# Patient Record
Sex: Male | Born: 1945 | Race: White | Hispanic: No | Marital: Married | Smoking: Former smoker
Health system: Southern US, Community
[De-identification: ages and names within clinical notes are randomized; demographics above are authoritative.]

## PROBLEM LIST (undated history)

## (undated) DIAGNOSIS — C61 Malignant neoplasm of prostate: Secondary | ICD-10-CM

## (undated) DIAGNOSIS — J189 Pneumonia, unspecified organism: Secondary | ICD-10-CM

## (undated) DIAGNOSIS — I10 Essential (primary) hypertension: Secondary | ICD-10-CM

## (undated) DIAGNOSIS — N289 Disorder of kidney and ureter, unspecified: Secondary | ICD-10-CM

## (undated) DIAGNOSIS — Z7901 Long term (current) use of anticoagulants: Secondary | ICD-10-CM

## (undated) DIAGNOSIS — D751 Secondary polycythemia: Secondary | ICD-10-CM

## (undated) DIAGNOSIS — K5790 Diverticulosis of intestine, part unspecified, without perforation or abscess without bleeding: Secondary | ICD-10-CM

## (undated) DIAGNOSIS — I82409 Acute embolism and thrombosis of unspecified deep veins of unspecified lower extremity: Secondary | ICD-10-CM

## (undated) DIAGNOSIS — Z86010 Personal history of colonic polyps: Secondary | ICD-10-CM

## (undated) DIAGNOSIS — D682 Hereditary deficiency of other clotting factors: Secondary | ICD-10-CM

## (undated) DIAGNOSIS — E7800 Pure hypercholesterolemia, unspecified: Secondary | ICD-10-CM

## (undated) DIAGNOSIS — N200 Calculus of kidney: Secondary | ICD-10-CM

## (undated) HISTORY — DX: Malignant neoplasm of prostate: C61

## (undated) HISTORY — DX: Diverticulosis of intestine, part unspecified, without perforation or abscess without bleeding: K57.90

## (undated) HISTORY — DX: Personal history of colonic polyps: Z86.010

## (undated) HISTORY — PX: COLONOSCOPY: SHX174

## (undated) HISTORY — DX: Long term (current) use of anticoagulants: Z79.01

## (undated) HISTORY — DX: Pneumonia, unspecified organism: J18.9

## (undated) HISTORY — DX: Acute embolism and thrombosis of unspecified deep veins of unspecified lower extremity: I82.409

## (undated) HISTORY — DX: Disorder of kidney and ureter, unspecified: N28.9

## (undated) HISTORY — DX: Essential (primary) hypertension: I10

## (undated) HISTORY — PX: TONSILLECTOMY: SUR1361

## (undated) HISTORY — DX: Pure hypercholesterolemia, unspecified: E78.00

## (undated) HISTORY — DX: Hereditary deficiency of other clotting factors: D68.2

## (undated) HISTORY — DX: Calculus of kidney: N20.0

## (undated) HISTORY — DX: Secondary polycythemia: D75.1

---

## 2000-01-22 ENCOUNTER — Encounter: Payer: Self-pay | Admitting: Internal Medicine

## 2000-01-22 ENCOUNTER — Inpatient Hospital Stay (HOSPITAL_COMMUNITY): Admission: EM | Admit: 2000-01-22 | Discharge: 2000-01-23 | Payer: Self-pay | Admitting: Emergency Medicine

## 2000-01-23 ENCOUNTER — Encounter: Payer: Self-pay | Admitting: Internal Medicine

## 2000-04-22 ENCOUNTER — Encounter: Payer: Self-pay | Admitting: Internal Medicine

## 2000-04-22 ENCOUNTER — Ambulatory Visit (HOSPITAL_COMMUNITY): Admission: RE | Admit: 2000-04-22 | Discharge: 2000-04-22 | Payer: Self-pay | Admitting: Internal Medicine

## 2005-05-11 ENCOUNTER — Ambulatory Visit: Payer: Self-pay | Admitting: Internal Medicine

## 2006-02-08 ENCOUNTER — Ambulatory Visit: Payer: Self-pay | Admitting: Internal Medicine

## 2006-02-15 ENCOUNTER — Ambulatory Visit: Payer: Self-pay | Admitting: *Deleted

## 2007-08-23 ENCOUNTER — Ambulatory Visit: Payer: Self-pay | Admitting: Internal Medicine

## 2007-08-23 DIAGNOSIS — I10 Essential (primary) hypertension: Secondary | ICD-10-CM | POA: Insufficient documentation

## 2007-08-24 ENCOUNTER — Ambulatory Visit: Payer: Self-pay | Admitting: Internal Medicine

## 2007-08-25 ENCOUNTER — Telehealth: Payer: Self-pay | Admitting: Internal Medicine

## 2007-08-25 ENCOUNTER — Encounter: Payer: Self-pay | Admitting: Internal Medicine

## 2008-05-24 ENCOUNTER — Ambulatory Visit: Payer: Self-pay | Admitting: Internal Medicine

## 2008-05-24 ENCOUNTER — Emergency Department (HOSPITAL_COMMUNITY): Admission: EM | Admit: 2008-05-24 | Discharge: 2008-05-24 | Payer: Self-pay | Admitting: Emergency Medicine

## 2008-06-25 ENCOUNTER — Ambulatory Visit: Payer: Self-pay | Admitting: Gastroenterology

## 2008-07-05 ENCOUNTER — Ambulatory Visit: Payer: Self-pay | Admitting: Gastroenterology

## 2008-10-11 ENCOUNTER — Ambulatory Visit: Payer: Self-pay | Admitting: Family Medicine

## 2009-01-02 ENCOUNTER — Ambulatory Visit: Payer: Self-pay | Admitting: Internal Medicine

## 2009-01-03 ENCOUNTER — Encounter: Payer: Self-pay | Admitting: Internal Medicine

## 2009-01-06 ENCOUNTER — Encounter: Payer: Self-pay | Admitting: Internal Medicine

## 2009-06-04 ENCOUNTER — Encounter: Payer: Self-pay | Admitting: Internal Medicine

## 2009-08-15 ENCOUNTER — Ambulatory Visit: Payer: Self-pay | Admitting: Internal Medicine

## 2009-08-15 ENCOUNTER — Ambulatory Visit: Payer: Self-pay

## 2009-08-15 DIAGNOSIS — I82409 Acute embolism and thrombosis of unspecified deep veins of unspecified lower extremity: Secondary | ICD-10-CM | POA: Insufficient documentation

## 2009-08-18 ENCOUNTER — Ambulatory Visit: Payer: Self-pay | Admitting: Internal Medicine

## 2009-08-18 LAB — CONVERTED CEMR LAB
Folate: 11.1 ng/mL
Vitamin B-12: 365 pg/mL (ref 211–911)

## 2009-08-20 DIAGNOSIS — D682 Hereditary deficiency of other clotting factors: Secondary | ICD-10-CM | POA: Insufficient documentation

## 2010-02-16 ENCOUNTER — Ambulatory Visit: Payer: Self-pay | Admitting: Cardiology

## 2010-06-03 ENCOUNTER — Ambulatory Visit: Payer: Self-pay | Admitting: Internal Medicine

## 2010-06-03 DIAGNOSIS — B351 Tinea unguium: Secondary | ICD-10-CM | POA: Insufficient documentation

## 2010-06-08 LAB — CONVERTED CEMR LAB
AST: 33 units/L (ref 0–37)
Albumin: 4.5 g/dL (ref 3.5–5.2)
Alkaline Phosphatase: 65 units/L (ref 39–117)
Basophils Absolute: 0 10*3/uL (ref 0.0–0.1)
Basophils Relative: 0.5 % (ref 0.0–3.0)
Bilirubin, Direct: 0.1 mg/dL (ref 0.0–0.3)
Calcium: 10.1 mg/dL (ref 8.4–10.5)
Cholesterol: 152 mg/dL (ref 0–200)
Creatinine, Ser: 1.3 mg/dL (ref 0.4–1.5)
Direct LDL: 74.5 mg/dL
Eosinophils Absolute: 0.2 10*3/uL (ref 0.0–0.7)
GFR calc non Af Amer: 61.07 mL/min (ref >60.00)
HDL: 28.3 mg/dL — ABNORMAL LOW (ref >39.00)
Hemoglobin: 17.3 g/dL — ABNORMAL HIGH (ref 13.0–17.0)
Lymphocytes Relative: 21.8 % (ref 12.0–46.0)
Monocytes Relative: 9.6 % (ref 3.0–12.0)
Neutro Abs: 4.3 10*3/uL (ref 1.4–7.7)
Neutrophils Relative %: 65.1 % (ref 43.0–77.0)
RBC: 4.63 M/uL (ref 4.22–5.81)
RDW: 12.9 % (ref 11.5–14.6)
Sodium: 141 meq/L (ref 135–145)
Total CHOL/HDL Ratio: 5
Triglycerides: 312 mg/dL — ABNORMAL HIGH (ref 0.0–149.0)

## 2010-06-23 ENCOUNTER — Ambulatory Visit: Payer: Self-pay | Admitting: Cardiology

## 2010-07-19 LAB — CONVERTED CEMR LAB
AntiThromb III Func: 93 % (ref 76–126)
Anticardiolipin IgG: 6 (ref <10)
Basophils Absolute: 0 10*3/uL (ref 0.0–0.1)
Basophils Relative: 0.4 % (ref 0.0–3.0)
Eosinophils Absolute: 0.2 10*3/uL (ref 0.0–0.7)
Eosinophils Relative: 3.5 % (ref 0.0–5.0)
HCT: 44.5 % (ref 39.0–52.0)
Hemoglobin: 15.4 g/dL (ref 13.0–17.0)
Homocysteine: 15.6 micromoles/L — ABNORMAL HIGH (ref 4.0–15.4)
INR: 1.1 — ABNORMAL HIGH (ref 0.8–1.0)
Lymphocytes Relative: 21.6 % (ref 12.0–46.0)
Lymphs Abs: 1.5 10*3/uL (ref 0.7–4.0)
MCHC: 34.5 g/dL (ref 30.0–36.0)
MCV: 103.3 fL — ABNORMAL HIGH (ref 78.0–100.0)
Monocytes Absolute: 0.6 10*3/uL (ref 0.1–1.0)
Monocytes Relative: 9.1 % (ref 3.0–12.0)
Neutro Abs: 4.7 10*3/uL (ref 1.4–7.7)
Neutrophils Relative %: 65.4 % (ref 43.0–77.0)
Platelets: 157 10*3/uL (ref 150.0–400.0)
Prothrombin Time: 11.6 s (ref 9.1–11.7)
RBC: 4.31 M/uL (ref 4.22–5.81)
RDW: 12.1 % (ref 11.5–14.6)
WBC: 7 10*3/uL (ref 4.5–10.5)

## 2010-07-23 NOTE — Assessment & Plan Note (Signed)
Summary: CPX (PT WILL COME IN FASTING) // RS   Vital Signs:  Patient profile:   65 year old male Height:      73.75 inches Weight:      213 pounds BMI:     27.63 Temp:     98.5 degrees F oral Pulse rate:   70 / minute Pulse rhythm:   regular BP sitting:   112 / 82  (left arm) Cuff size:   large  Vitals Entered By: Alfred Levins, CMA (June 03, 2010 8:08 AM) CC: cpx, fasting   CC:  cpx and fasting.  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 Take 1 Tablet By Mouth Every Day 2)  Metoprolol Tartrate 25 Mg Tabs (Metoprolol Tartrate) .... Take 1 Tablet By Mouth Two Times A Day 3)  Niaspan 1000 Mg  Tbcr (Niacin (Antihyperlipidemic)) .... Take 1 Tablet By Mouth Once A Day 4)  Dramamine 50 Mg  Tabs (Dimenhydrinate) .Marland Kitchen.. 1 1/2 Hs 5)  Multivitamins  Caps (Multiple Vitamin) .... Once Daily 6)  Aspirin 325 Mg Tabs (Aspirin) .... Take 1 Tab By Mouth Every Day  Allergies (verified): No Known Drug Allergies  Past History:  Past Medical History: Hypertension DVT---followed by dr Patty Sermons (off warfarin after 6-7 months) Factor V deficiency  Physical Exam  General:  well-developed well-nourished male in no acute distress. HEENT exam atraumatic, normocephalic symmetric her muscles are intact. He is wearing glasses. Neck is supple without lymphadenopathy or jugular venous distention. Chest clear to auscultation cardiac exam S1-S2 are regular. Abdominal exam across and soft and nontender. Extremities no clubbing cyanosis or edema. Neurologic exam is alert and oriented without any motor or sensory deficits. Rectal exam normal tone moderately enlarged prostate without masses or asymmetry.   Impression & Recommendations:  Problem # 1:  PHYSICAL  EXAMINATION (ICD-V70.0) health maintenance up-to-date except he needs tetanus immunization and shingles vaccine. He's had flu immunization. Reviewed colonoscopy report. He exercises regularly. If his lab work is unremarkable I'll see him back in one year. Orders: Venipuncture (04540) TLB-Lipid Panel (80061-LIPID) TLB-BMP (Basic Metabolic Panel-BMET) (80048-METABOL) TLB-CBC Platelet - w/Differential (85025-CBCD) TLB-Hepatic/Liver Function Pnl (80076-HEPATIC) TLB-TSH (Thyroid Stimulating Hormone) (84443-TSH) TLB-PSA (Prostate Specific Antigen) (84153-PSA) UA Dipstick w/o Micro (automated)  (81003)  Complete Medication List: 1)  Spironolactone 25 Mg Tabs (Spironolactone) .... Take 1 tablet by mouth every day 2)  Metoprolol Tartrate 25 Mg Tabs (Metoprolol tartrate) .... Take 1 tablet by mouth two times a day 3)  Niaspan 1000 Mg Tbcr (Niacin (antihyperlipidemic)) .... Take 1 tablet by mouth once a day 4)  Dramamine 50 Mg Tabs (Dimenhydrinate) .Marland Kitchen.. 1 1/2 hs 5)  Multivitamins Caps (Multiple vitamin) .... Once daily 6)  Aspirin 325 Mg Tabs (Aspirin) .... Take 1 tab by mouth every day    Orders Added: 1)  Venipuncture [36415] 2)  TLB-Lipid Panel [80061-LIPID] 3)  TLB-BMP (Basic Metabolic Panel-BMET) [80048-METABOL] 4)  TLB-CBC Platelet - w/Differential [85025-CBCD] 5)  TLB-Hepatic/Liver Function Pnl [80076-HEPATIC] 6)  TLB-TSH (Thyroid Stimulating Hormone) [84443-TSH] 7)  TLB-PSA (Prostate Specific Antigen) [98119-JYN] 8)  UA Dipstick w/o Micro (automated)  [81003]   Immunization History:  Influenza Immunization History:    Influenza:  fluvax 3+ (05/21/2010)   Immunization History:  Influenza Immunization History:    Influenza:  Fluvax 3+ (05/21/2010)  Appended Document: Orders Update Medications Added TERBINAFINE HCL 250 MG TABS (TERBINAFINE HCL) Take 1 tablet by mouth once a day          Clinical Lists Changes  Medications: Added new medication of TERBINAFINE HCL 250  MG TABS (TERBINAFINE HCL) Take 1 tablet by mouth once a day - Signed Rx of TERBINAFINE HCL 250 MG TABS (TERBINAFINE HCL) Take 1 tablet by mouth once a day;  #90 x 0;  Signed;  Entered by: Birdie Sons MD;  Authorized by: Birdie Sons MD;  Method used: Electronically to [facility] [address] [phone]*, [address], [address] [place], [place]  [address], Ph: [phone], Fax: [phone] Orders: Added new Service order of Est. Patient 40-64 years (57846) - Signed Added new Service order of Est. Patient Level II ([address]) - Signed    Prescriptions: TERBINAFINE HCL 250 MG TABS (TERBINAFINE HCL) Take 1 tablet by mouth once a day  #90 x 0   Entered and Authorized by:   Birdie Sons MD   Signed by:   Birdie Sons MD on 06/03/2010   Method used:   Electronically to        [facility] [address] [phone]* (retail)       [address]       [address]       [place], [place]  [address]       Ph: [phone]       Fax: [phone]   RxID:   [id number]    At the end of examination patient reports a new complaint. He does have discolored, thickened toenails on both feet. He would like treatment. Discussed. Will treat with Lamisil 250 mg p.o. q. day for 90 days.  Appended Document: Orders Update     Clinical Lists Changes  Orders: Added new Service order of Specimen Handling (66063) - Signed      Appended Document: CPX (PT WILL COME IN FASTING) // RS  Laboratory Results   Urine Tests    Routine Urinalysis   Color: yellow Appearance: Clear Glucose: negative   (Normal Range: Negative) Bilirubin: 1+   (Normal Range: Negative) Ketone: 1+   (Normal Range: Negative) Spec. Gravity: 1.025   (Normal Range: 1.003-1.035) Blood: 1+   (Normal Range: Negative) pH: 5.0   (Normal Range: 5.0-8.0) Protein: 1+   (Normal Range: Negative) Urobilinogen: 0.2   (Normal Range: 0-1) Nitrite: negative   (Normal Range: Negative) Leukocyte Esterace: negative   (Normal Range: Negative)      Comments: Rita Ohara  June 03, 2010 11:55 AM       Immunizations Administered:  Tetanus Vaccine:    Vaccine Type: Tdap    Site: left deltoid    Mfr: GlaxoSmithKline    Dose: 0.5 ml    Route: IM    Given by: Alfred Levins, CMA    Exp. Date: 04/09/2012    Lot #: [id number]  Zostavax # 1:    Vaccine Type: Zostavax    Site: right arm    Mfr: Merck    Dose: 0.5 ml    Route:     Given by: Alfred Levins, CMA    Exp. Date: 04/22/2011    Lot #: [id number]

## 2010-07-23 NOTE — Assessment & Plan Note (Signed)
Summary: PAINFUL KNOT ON L LEG // RS   Vital Signs:  Patient profile:   65 year old male Height:      74 inches Weight:      211 pounds BMI:     27.19 Temp:     98.1 degrees F oral BP sitting:   130 / 90  (left arm) Cuff size:   regular  Vitals Entered By: Kern Reap CMA Duncan Dull) (August 15, 2009 9:28 AM) CC: pain with left calf Is Patient Diabetic? No Pain Assessment Patient in pain? yes     Location: calf Intensity: 1 Type: dull   CC:  pain with left calf.  History of Present Illness: long trip to [place] and back , returned wed on the way down he noted painful left calf and a knot on the back eval at local [facility]---unrevealing now has some swelling of left leg hx of superficial phlebiits  All other systems reviewed and were negative   Current Problems (verified): 1)  Special Screening For Malignant Neoplasms Colon  (ICD-V76.51) 2)  Insomnia  (ICD-780.52) 3)  Hypertension  (ICD-401.9)  Current Medications (verified): 1)  Spironolactone 25 Mg  Tabs (Spironolactone) .... Take 1 Tablet By Mouth Every Day 2)  Metoprolol Tartrate 25 Mg Tabs (Metoprolol Tartrate) .... Take 1 Tablet By Mouth Two Times A Day 3)  Niaspan 1000 Mg  Tbcr (Niacin (Antihyperlipidemic)) .... Take 1 Tablet By Mouth Once A Day 4)  Aspirin Ec 325 Mg  Tbec (Aspirin) .... Once Daily 5)  Dramamine 50 Mg  Tabs (Dimenhydrinate) .Marland Kitchen.. 1 1/2 Hs 6)  Multivitamins  Caps (Multiple Vitamin) .... Once Daily  Allergies (verified): No Known Drug Allergies  Past History:  Past Medical History: Last updated: 08/23/2007 Hypertension insomnia  Past Surgical History: Last updated: 08/23/2007 Tonsillectomy, adenoidectomy  Family History: Last updated: 08/23/2007 Family History Other cancer--father pancreatic, deceased Family History Hypertension--father mother with dementia  Social History: Last updated: 08/23/2007 Occupation:accountant Married 3 healthy children  Risk Factors: Smoking Status: quit  (08/23/2007)  Review of Systems       All other systems reviewed and were negative   Physical Exam  General:  Well-developed,well-nourished,in no acute distress; alert,appropriate and cooperative throughout examination Pulses:  R radial normal and L radial normal.   Extremities:  1+ left pedal edema.  0 edema on right Skin:  hyperpigmentation of both legs below mid-calf   Impression & Recommendations:  Problem # 1:  LEG PAIN, LEFT (ICD-729.5) some concern for DVT needs eval ultrasound left leg today  Complete Medication List: 1)  Spironolactone 25 Mg Tabs (Spironolactone) .... Take 1 tablet by mouth every day 2)  Metoprolol Tartrate 25 Mg Tabs (Metoprolol tartrate) .... Take 1 tablet by mouth two times a day 3)  Niaspan 1000 Mg Tbcr (Niacin (antihyperlipidemic)) .... Take 1 tablet by mouth once a day 4)  Aspirin Ec 325 Mg Tbec (Aspirin) .... Once daily 5)  Dramamine 50 Mg Tabs (Dimenhydrinate) .Marland Kitchen.. 1 1/2 hs 6)  Multivitamins Caps (Multiple vitamin) .... Once daily  Appended Document: Orders Update     Clinical Lists Changes  Orders: Added new Referral order of Misc. Referral (Misc. Ref) - Signed      Appended Document: PAINFUL KNOT ON L LEG // RS     Vital Signs:  Patient profile:   65 year old male Weight:      211 pounds (95.91 kg)  History of Present Illness: patient returns with his wife.  He has a new diagnosis of  DVT.  Upon further questioning he denies any shortness of breath.  Leg discomfort is rated as one.  Have discussed DVT results with ultrasound technician.  Allergies: No Known Drug Allergies  Physical Exam  Neck:  no adenopathy Chest Wall:  No deformities, masses, tenderness or gynecomastia noted. Lungs:  Normal respiratory effort, chest expands symmetrically. Lungs are clear to auscultation, no crackles or wheezes. Heart:  normal rate, regular rhythm, and no rub.   Abdomen:  Bowel sounds positive,abdomen soft and non-tender without  masses, organomegaly or hernias noted.   Impression & Recommendations:  Problem # 1:  DVT (ICD-453.40) long discussion with pt and wife check labs instructed on use of lovenox---called pharmacy to confirm they have lovenox see me Monday Orders: Venipuncture ([address]) TLB-CBC Platelet - w/Differential (85025-CBCD) TLB-PT (Protime) (85610-PTP) T-Hypercoagulation Profile [phone]) Lovenox 10mg  Inj. (G9562) Admin of Therapeutic Inj  intramuscular or subcutaneous (13086)  Complete Medication List: 1)  Spironolactone 25 Mg Tabs (Spironolactone) .... Take 1 tablet by mouth every day 2)  Metoprolol Tartrate 25 Mg Tabs (Metoprolol tartrate) .... Take 1 tablet by mouth two times a day 3)  Niaspan 1000 Mg Tbcr (Niacin (antihyperlipidemic)) .... Take 1 tablet by mouth once a day 4)  Aspirin Ec 325 Mg Tbec (Aspirin) .... Once daily 5)  Dramamine 50 Mg Tabs (Dimenhydrinate) .Marland Kitchen.. 1 1/2 hs 6)  Multivitamins Caps (Multiple vitamin) .... Once daily 7)  Warfarin Sodium 5 Mg Tabs (Warfarin sodium) .... 2 by mouth once daily or as directed 8)  Lovenox 150 Mg/ml Soln (Enoxaparin sodium) .... Inject contents of one syringe subcutaneously once daily for 10 days  Patient Instructions: 1)  See me Monday Prescriptions: LOVENOX 150 MG/ML SOLN (ENOXAPARIN SODIUM) inject contents of one syringe Subcutaneously once daily for 10 days  #10 x 0   Entered and Authorized by:   Birdie Sons MD   Signed by:   Birdie Sons MD on 08/15/2009   Method used:   Electronically to        [facility] [address] [phone]* (retail)       [address]       [address]       [place], [place]  [address]       Ph: [phone]       Fax: [phone]   RxID:   [id number] WARFARIN SODIUM 5 MG TABS (WARFARIN SODIUM) 2 by mouth once daily or as directed  #60 x 3   Entered and Authorized by:   Birdie Sons MD   Signed by:   Birdie Sons MD on 08/15/2009   Method used:   Electronically to        [facility]  Dr. [phone]* (retail)       [address]       [address]       [place], [place]  [address]       Ph: [phone]       Fax: [phone]   RxID:   [id number]    Medication Administration  Injection # 1:    Medication: Lovenox 10mg  Inj.    Diagnosis: DVT (ICD-453.40)    Route: SQ    Site: r abd    Exp Date: 08/19/2009    Lot #: [id number]    Mfr: Sanofi Pasteur    Comments: 150 mg/ml given    Patient tolerated injection without complications    Given by: Kern Reap CMA Duncan Dull) (August 15, 2009 12:46 PM)  Orders Added: 1)  Venipuncture [  36415] 2)  TLB-CBC Platelet - w/Differential [85025-CBCD] 3)  TLB-PT (Protime) [85610-PTP] 4)  T-Hypercoagulation Profile [83090-83892-85730] 5)  Lovenox 10mg  Inj. [J1650] 6)  Admin of Therapeutic Inj  intramuscular or subcutaneous [96[address]] 7)  Est. Patient Level V [04540]

## 2010-07-23 NOTE — Assessment & Plan Note (Signed)
Summary: 3 day rov/njr   Vital Signs:  Patient profile:   65 year old male Temp:     98.3 degrees F oral Pulse rate:   60 / minute Resp:     12 per minute BP sitting:   146 / 96  (left arm)  Vitals Entered By: Gladis Riffle, RN (August 18, 2009 9:09 AM) CC: 3 day rov--states leg without pain or" knot" Is Patient Diabetic? No   CC:  3 day rov--states leg without pain or" knot".  History of Present Illness: DVT followup  tolerating lovenox tolerating warfarin 10 mg by mouth once daily  NO SOB pain of left leg has resolved no edema  All other systems reviewed and were negative   Preventive Screening-Counseling & Management  Alcohol-Tobacco     Smoking Status: quit  Medications Prior to Update: 1)  Spironolactone 25 Mg  Tabs (Spironolactone) .... Take 1 Tablet By Mouth Every Day 2)  Metoprolol Tartrate 25 Mg Tabs (Metoprolol Tartrate) .... Take 1 Tablet By Mouth Two Times A Day 3)  Niaspan 1000 Mg  Tbcr (Niacin (Antihyperlipidemic)) .... Take 1 Tablet By Mouth Once A Day 4)  Aspirin Ec 325 Mg  Tbec (Aspirin) .... Once Daily 5)  Dramamine 50 Mg  Tabs (Dimenhydrinate) .Marland Kitchen.. 1 1/2 Hs 6)  Multivitamins  Caps (Multiple Vitamin) .... Once Daily 7)  Warfarin Sodium 5 Mg Tabs (Warfarin Sodium) .... 2 By Mouth Once Daily or As Directed 8)  Lovenox 150 Mg/ml Soln (Enoxaparin Sodium) .... Inject Contents of One Syringe Subcutaneously Once Daily For 10 Days  Allergies (verified): No Known Drug Allergies  Physical Exam  General:  Well-developed,well-nourished,in no acute distress; alert,appropriate and cooperative throughout examination Head:  normocephalic and atraumatic.   Msk:  No deformity or scoliosis noted of thoracic or lumbar spine.   Extremities:  No clubbing, cyanosis, edema, or deformity noted  Neurologic:  cranial nerves II-XII intact and gait normal.   Skin:  turgor normal and color normal.     Impression & Recommendations:  Problem # 1:  DVT  (ICD-453.40) clinically improving see new orders Orders: Fingerstick ([address]) Protime (60454UJ) TLB-B12 + Folate Pnl (81191_47829-F62/ZHY) Venipuncture (86578)  Problem # 2:  HYPERTENSION (ICD-401.9) BPs at home 130/75---continue to monitor His updated medication list for this problem includes:    Spironolactone 25 Mg Tabs (Spironolactone) .Marland Kitchen... Take 1 tablet by mouth every day    Metoprolol Tartrate 25 Mg Tabs (Metoprolol tartrate) .Marland Kitchen... Take 1 tablet by mouth two times a day  BP today: 146/96 Prior BP: 130/90 (08/15/2009)  Problem # 3:  COAGULOPATHY, COUMADIN-INDUCED (ICD-286.5) INR 8.0 stop lovenox stop asa stop warfarin for one day resume warfarin at 2.5 mg by mouth once daily  protime friday  Complete Medication List: 1)  Spironolactone 25 Mg Tabs (Spironolactone) .... Take 1 tablet by mouth every day 2)  Metoprolol Tartrate 25 Mg Tabs (Metoprolol tartrate) .... Take 1 tablet by mouth two times a day 3)  Niaspan 1000 Mg Tbcr (Niacin (antihyperlipidemic)) .... Take 1 tablet by mouth once a day 4)  Dramamine 50 Mg Tabs (Dimenhydrinate) .Marland Kitchen.. 1 1/2 hs 5)  Multivitamins Caps (Multiple vitamin) .... Once daily 6)  Warfarin Sodium 5 Mg Tabs (Warfarin sodium) .... 1/2 by mouth once daily or as directed  Appended Document: 3 day rov/njr     Allergies: No Known Drug Allergies   Complete Medication List: 1)  Spironolactone 25 Mg Tabs (Spironolactone) .... Take 1 tablet by mouth every day 2)  Metoprolol  Tartrate 25 Mg Tabs (Metoprolol tartrate) .... Take 1 tablet by mouth two times a day 3)  Niaspan 1000 Mg Tbcr (Niacin (antihyperlipidemic)) .... Take 1 tablet by mouth once a day 4)  Dramamine 50 Mg Tabs (Dimenhydrinate) .Marland Kitchen.. 1 1/2 hs 5)  Multivitamins Caps (Multiple vitamin) .... Once daily 6)  Warfarin Sodium 5 Mg Tabs (Warfarin sodium) .... 1/2 by mouth once daily or as directed    ANTICOAGULATION RECORD PREVIOUS REGIMEN & LAB RESULTS   Previous INR:  1.1 ratio on   08/15/2009    NEW REGIMEN & LAB RESULTS Anticoag. Dx: Deep venous thrombosis Current INR Goal Range: 2.0-3.0 Current INR: 8.0 Current Coumadin Dose(mg): 5mg  qd Regimen: 2.5 Coagulation Comments: stop lovenox, warfarin tomorrow, ASA then 2.5mg  once daily  Provider: Shahram Alexopoulos Repeat testing in: 08/22/09  Anticoagulation Visit Questionnaire Coumadin dose missed/changed:  No Abnormal Bleeding Symptoms:  No  Any diet changes including alcohol intake, vegetables or greens since the last visit:  No Any illnesses or hospitalizations since the last visit:  No Any signs of clotting since the last visit (including chest discomfort, dizziness, shortness of breath, arm tingling, slurred speech, swelling or redness in leg):  No  MEDICATIONS SPIRONOLACTONE 25 MG  TABS (SPIRONOLACTONE) Take 1 tablet by mouth every day METOPROLOL TARTRATE 25 MG TABS (METOPROLOL TARTRATE) Take 1 tablet by mouth two times a day NIASPAN 1000 MG  TBCR (NIACIN (ANTIHYPERLIPIDEMIC)) Take 1 tablet by mouth once a day DRAMAMINE 50 MG  TABS (DIMENHYDRINATE) 1 1/2 hs MULTIVITAMINS  CAPS (MULTIPLE VITAMIN) once daily WARFARIN SODIUM 5 MG TABS (WARFARIN SODIUM) 1/2 by mouth once daily or as directed

## 2010-07-23 NOTE — Miscellaneous (Signed)
Summary: Orders Update  Clinical Lists Changes  Orders: Added new Test order of Venous Duplex Lower Extremity (Venous Duplex Lower) - Signed 

## 2010-10-12 ENCOUNTER — Encounter: Payer: Self-pay | Admitting: Cardiology

## 2010-10-12 ENCOUNTER — Other Ambulatory Visit: Payer: Self-pay | Admitting: *Deleted

## 2010-10-12 DIAGNOSIS — I10 Essential (primary) hypertension: Secondary | ICD-10-CM | POA: Insufficient documentation

## 2010-10-12 DIAGNOSIS — R058 Other specified cough: Secondary | ICD-10-CM | POA: Insufficient documentation

## 2010-10-12 DIAGNOSIS — E7800 Pure hypercholesterolemia, unspecified: Secondary | ICD-10-CM

## 2010-10-12 DIAGNOSIS — R05 Cough: Secondary | ICD-10-CM | POA: Insufficient documentation

## 2010-10-12 DIAGNOSIS — D751 Secondary polycythemia: Secondary | ICD-10-CM | POA: Insufficient documentation

## 2010-10-12 DIAGNOSIS — Z79899 Other long term (current) drug therapy: Secondary | ICD-10-CM

## 2010-10-12 DIAGNOSIS — I82409 Acute embolism and thrombosis of unspecified deep veins of unspecified lower extremity: Secondary | ICD-10-CM | POA: Insufficient documentation

## 2010-10-19 ENCOUNTER — Encounter: Payer: Self-pay | Admitting: Cardiology

## 2010-10-19 ENCOUNTER — Ambulatory Visit (INDEPENDENT_AMBULATORY_CARE_PROVIDER_SITE_OTHER): Payer: 59 | Admitting: Cardiology

## 2010-10-19 ENCOUNTER — Other Ambulatory Visit (INDEPENDENT_AMBULATORY_CARE_PROVIDER_SITE_OTHER): Payer: 59 | Admitting: *Deleted

## 2010-10-19 VITALS — BP 136/80 | HR 66 | Wt 215.0 lb

## 2010-10-19 DIAGNOSIS — I10 Essential (primary) hypertension: Secondary | ICD-10-CM

## 2010-10-19 DIAGNOSIS — Z79899 Other long term (current) drug therapy: Secondary | ICD-10-CM

## 2010-10-19 DIAGNOSIS — E7800 Pure hypercholesterolemia, unspecified: Secondary | ICD-10-CM

## 2010-10-19 DIAGNOSIS — I359 Nonrheumatic aortic valve disorder, unspecified: Secondary | ICD-10-CM

## 2010-10-19 DIAGNOSIS — I82409 Acute embolism and thrombosis of unspecified deep veins of unspecified lower extremity: Secondary | ICD-10-CM

## 2010-10-19 DIAGNOSIS — D751 Secondary polycythemia: Secondary | ICD-10-CM

## 2010-10-19 LAB — HEPATIC FUNCTION PANEL
Bilirubin, Direct: 0.1 mg/dL (ref 0.0–0.3)
Total Bilirubin: 0.7 mg/dL (ref 0.3–1.2)

## 2010-10-19 LAB — LIPID PANEL
HDL: 26.3 mg/dL — ABNORMAL LOW (ref >39.00)
LDL Cholesterol: 73 mg/dL (ref 0–99)
Total CHOL/HDL Ratio: 5
Triglycerides: 177 mg/dL — ABNORMAL HIGH (ref 0.0–149.0)
VLDL: 35.4 mg/dL (ref 0.0–40.0)

## 2010-10-19 LAB — BASIC METABOLIC PANEL
Calcium: 9.4 mg/dL (ref 8.4–10.5)
Creatinine, Ser: 1.4 mg/dL (ref 0.4–1.5)
GFR: 55.88 mL/min — ABNORMAL LOW (ref >60.00)
Sodium: 136 mEq/L (ref 135–145)

## 2010-10-19 LAB — CBC WITH DIFFERENTIAL/PLATELET
Basophils Absolute: 0 10*3/uL (ref 0.0–0.1)
Eosinophils Absolute: 0.2 10*3/uL (ref 0.0–0.7)
HCT: 47.6 % (ref 39.0–52.0)
Hemoglobin: 16.6 g/dL (ref 13.0–17.0)
Lymphs Abs: 1.6 10*3/uL (ref 0.7–4.0)
MCHC: 35 g/dL (ref 30.0–36.0)
Neutro Abs: 3.8 10*3/uL (ref 1.4–7.7)
Platelets: 199 10*3/uL (ref 150.0–400.0)
RDW: 12.2 % (ref 11.5–14.6)

## 2010-10-19 NOTE — Progress Notes (Signed)
Patrick Sosa Date of Birth:  February 17, 1946 [facility] [address]   [address] [place], [place]  [address] [phone]           Fax   [phone]  History of Present Illness: This pleasant 65 year old gentleman is seen for a scheduled 4 month followup office visit.  He has a past history of essential hypertension and history of hypercholesterolemia.  He also has a history of we are checking blood work today.  In February 2011 he developed deep vein thrombosis in his left leg after a long car drive to [place].  He remained on Coumadin for 6 months and was stopped in September 2011.  He has not had any recurrence of his phlebitis.  He has a history of polycythemia.  We saw him in December his hemoglobin was up to 17.  He has given blood to the [facility] since then and we are rechecking her hemoglobin today.  The patient has not been expressing any cardiovascular symptoms of chest pain or shortness of breath.  He has a history of essential hypertension and a past history of symptomatic PVCs which have responded to adjustment in medication.  The patient had a nuclear stress test 05/15/08 which was normal and showed no ischemia and his ejection fraction was 65%.  Current Outpatient Prescriptions  Medication Sig Dispense Refill  . aspirin 325 MG tablet Take 325 mg by mouth daily.        Marland Kitchen dimenhyDRINATE (DRAMAMINE) 50 MG tablet Take 50 mg by mouth every 8 (eight) hours as needed.        . metoprolol tartrate (LOPRESSOR) 25 MG tablet Take 25 mg by mouth 2 (two) times daily.        . pravastatin (PRAVACHOL) 20 MG tablet Take 20 mg by mouth daily.        Marland Kitchen spironolactone (ALDACTONE) 25 MG tablet Take 25 mg by mouth daily.        . niacin (NIASPAN) 1000 MG CR tablet Take 1,000 mg by mouth at bedtime.          No Known Allergies  Patient Active Problem List  Diagnoses  . FACTOR V DEFICIENCY  . HYPERTENSION  . DVT  . ONYCHOMYCOSIS  . Hypertension  . Polycythemia  .  DVT (deep venous thrombosis)  . Hypercholesterolemia  . Dry cough    History  Smoking status  . Former Smoker  . Quit date: 10/12/1987  Smokeless tobacco  . Not on file    History  Alcohol Use No    Family History  Problem Relation Age of Onset  . Alzheimer's disease Mother   . Pancreatic cancer Father   . Hypertension Father   . Heart attack Father   . Hypertension Sister     Review of Systems: Constitutional: no fever chills diaphoresis or fatigue or change in weight.  Head and neck: no hearing loss, no epistaxis, no photophobia or visual disturbance. Respiratory: No cough, shortness of breath or wheezing. Cardiovascular: No chest pain peripheral edema, palpitations. Gastrointestinal: No abdominal distention, no abdominal pain, no change in bowel habits hematochezia or melena. Genitourinary: No dysuria, no frequency, no urgency, no nocturia. Musculoskeletal:No arthralgias, no back pain, no gait disturbance or myalgias. Neurological: No dizziness, no headaches, no numbness, no seizures, no syncope, no weakness, no tremors. Hematologic: No lymphadenopathy, no easy bruising. Psychiatric: No confusion, no hallucinations, no sleep disturbance.    Physical Exam: Filed Vitals:   10/19/10 0857  BP:  136/80  Pulse: 66  The general appearance reveals a well-developed well-nourished gentleman in no distress.Pupils equal and reactive.   Extraocular Movements are full.  There is no scleral icterus.  The mouth and pharynx are normal.  The neck is supple.  The carotids reveal no bruits.  The jugular venous pressure is normal.  The thyroid is not enlarged.  There is no lymphadenopathy.The chest is clear to percussion and auscultation. There are no rales or rhonchi. Expansion of the chest is symmetrical.The precordium is quiet.  The first heart sound is normal.  The second heart sound is physiologically split.  There is no murmur gallop rub or click.  There is no abnormal lift or  heave.The abdomen is soft and nontender. Bowel sounds are normal. The liver and spleen are not enlarged. There Are no abdominal masses. There are no bruits. Normal extremity.Strength is normal and symmetrical in all extremities.  There is no lateralizing weakness.  There are no sensory deficits.The skin is warm and dry.  There is no rash.   Assessment / Plan: Continue same medication.  Recheck in 4 months for followup office visit and lab work

## 2010-10-19 NOTE — Assessment & Plan Note (Signed)
The patient has a history of polycythemia.  He did donate blood to the [facility] in February and anticipates Giving another unit of blood in may.

## 2010-10-19 NOTE — Assessment & Plan Note (Signed)
The patient has not had any chest pain or shortness of breath.  His had no dizziness or syncope or headaches.  No palpitations.

## 2010-10-19 NOTE — Assessment & Plan Note (Signed)
The patient has been doing well.  He has not been experiencing any deep vein thrombosis.  He is no longer on Coumadin.  His prior DVT occurred in the setting of a long car trip.He is anxious about what to do if he takes another long car trip.Because of his clotting problem history pericarditis put him on short-term Coumadin again and We can talk about that further when the opportunity arises.

## 2010-10-21 ENCOUNTER — Telehealth: Payer: Self-pay | Admitting: *Deleted

## 2010-10-21 NOTE — Telephone Encounter (Signed)
Adv pt of lab results 

## 2010-11-06 NOTE — Discharge Summary (Signed)
[place]. [facility]  Patient:    Patrick Sosa, Patrick Sosa                         MRN: [id number] Adm. Date:  [id number] Disc. Date: [id number] Attending:  Judie Petit Dictator:   Delton See, P.A. CC:         Valetta Mole. Swords, M.D. [facility]                           Discharge Summary  DATE OF BIRTH:  December 13, 1945  HISTORY ON ADMISSION:  This is a 65 year old male who was admitted to [facility] after being seen in the office on January 22, 2000 by Dr. Cato Mulligan for evaluation and and chest pain.  He has a history significant for sinus problems, hypertension, diverticular disease, superficial phlebitis, and tonsillectomy at age 50.  In the emergency room, his telemetry revealed frequent PVCs and bigeminy at times.  The patients wife reported that his problems started recently when a new medication was added.  Apparently, this medication was Micardis.  The patient has had blood pressures that have been very difficult to control and has required multiple medications.  He saw a cardiologist at [facility] about a year and a half ago for uncontrolled hypertension, and he was placed on a calcium channel blocker at that time; however, he felt that this made his heart "flutter."  He stopped taking the medication.  He also had a renal ultrasound at that time that according to the patient was normal.  He states that he was not interested in being seen by [facility] again at this time.  He was seen in consultation by [facility] for evaluation of chest pain.  As noted, the patient saw Dr. Cato Mulligan on the day of admission for adjustment of his blood pressure medications.  He had been on Tiazac; however, this was discontinued about a month ago at the recommendation of his dentist who felt that it was causing the patient to have bleeding gums.  He was placed on Micardis at that time. The patient feels that this medication has caused  him to have palpitations and has not controlled his blood pressure as well. He saw Dr. Cato Mulligan today and noted that he was having light chest pressure intermittently times several days.  The pain was high in the sternum and to the left side of the chest.  Occasionally, he had some tightness in his left shoulder.  There was no shortness of breath, no diaphoresis, no nausea.  He did feel occasional lightheadedness.  Nothing seemed to precipitate or relieve the pain.  It was not related to exertion.  He was not sure how long it lasted and it was described as a 1 on a 10 scale.  CARDIAC RISKS FACTORS:  Negative for diabetes, positive for hypertension.  He has a low HDL and has been on Niaspan.  He quit tobacco 15 years ago.  He smoked 1 pack per day for 10 years.  His father had an MI at age 87.  The patient stated was precipitated by low potassium level.  He is not obese.  PAST MEDICAL HISTORY:  (Please see above).  ALLERGIES:  No known drug allergies.  MEDICATIONS AT TIME OF ADMISSION: 1. Atenolol 100 mg b.i.d. 2. Allegra 60 mg b.i.d. p.r.n. 3. Niaspan 1000 mg every other day. 4. Nitroglycerin drip in the emergency room.  5. Aspirin daily. 6. Micardis 40 mg daily. 7. Accupril 40 mg daily.  SOCIAL HISTORY:  The patient is married and he lives in [place].  He has three children.  He works as an Airline pilot.  He denies stress at work.  He has a remote tobacco history and he drinks alcohol occasionally.  He walks regularly.  HOSPITAL COURSE:  As noted, this patient was admitted through the emergency room by Dr. Cato Mulligan for further evaluation of chest pain and difficult to control hypertension.  He was seen in consultation by Dr. Daleen Squibb in the emergency room for evaluation of chest pain, and as noted, he had frequent PVCs and bigeminy in the emergency room.  His potassium was noted to be low at 3.3.  Patients potassium was supplemented.  He had a 2-D echo performed on January 22, 2000  and was scheduled for an exercise Cardiolite on January 23, 2000.  The patients echo was performed on January 22, 2000.  This was read by Dr. Lewayne Bunting.  The patient had an overall normal ventricular systolic function with an EF 55-65%.  There was an increased relative contribution of atrial contractions to the left ventricular filling.  The aortic valve thickness was mildly increased.  There was mild aortic valvular regurgitation. The left atrium was mildly dilated.  The echo was felt to be abnormal, although with the mild abnormalities as described above.  As noted, the patient had a low potassium level on admission and this was supplemented.  A repeat was performed the following day and his potassium was found to be even low at 3.1.  The patient was scheduled for an inpatient exercise Cardiolite on January 23, 2000.  The low potassium level was discussed with Dr. Ladona Ridgel and it was felt that it was safe to proceed.  His target heart rate was 144 beats per minute, although his resting heart rate was 59 beats per minute.  As noted, he is on atenolol 100 mg b.i.d. and had received a dose the evening before.  The exercise Cardiolite was attempted, but after 7 minutes and 21 seconds, the patients heart rate was only 114 beats per minute.  His target heart rate was 144.  The patient developed fatigue and was unable to reach his target heart rate.  He was changed to an adenosine Cardiolite which was performed without significant difficulty.  The results of the adenosine Cardiolite preliminary report reveals ejection fraction 53% with no ischemic changes. There was mild apical hypokinesis.  As noted, the patient has difficulty to control hypertension.  His Accupril had previously been 40 mg daily.  This was increased to 40 mg b.i.d. at time of admission.  Arrangements are made to discharge the patient home later on the evening of January 23, 2000.  A follow-up potassium level revealed potassium to  be 3.5.  He was sent home on a potassium supplement.  LABORATORY DATA:  As noted, a repeat potassium level was 3.5.  His potassium  on admission had been 3.3.  A follow-up potassium level following a supplementation was 3.1.  Also on admission, an EKG showed sinus bradycardia rate 51 beats per minute with an incomplete right bundle branch block, but no definite ischemia.  Magnesium level was 2.3, PTT was 29, INR was 1.1, cardiac enzymes are negative, BUN was 13, creatinine 1.1, sodium 138, potassium 3.3. A CBC was within normal limits.  An A.M. cortisol level, as well as an aldosterone level was ordered and is pending at the  time of this dictation. There are currently no labs in the chart.  There is currently no chest x-ray report in the chart.  DISCHARGE MEDICATIONS: 1. K-Dur 20 mg 1 each day. 2. Accupril 40 mg twice each day. 3. Atenolol 100 mg twice each day. 4. Micardis 40 mg daily. 5. Coated aspirin 325 mg daily. 6. Niaspan to be taken as previously.  ACTIVITY:  As tolerated.  The patient was told to stay on a low-salt, low-fat diet.  SPECIAL INSTRUCTIONS:  He was to have the potassium level at the [facility] office, Wednesday, January 27, 2000.  FOLLOW-UP:  He was to see Dr. Cato Mulligan as scheduled, and he was told to call the office Monday for a follow-up appointment with Dr. Daleen Squibb.  PROBLEMS LISTED AT TIME OF DISCHARGE: 1. Chest pain - myocardial infaction ruled out. 2. Unexplained hypokalemia with further studies pending. 3. Adenosine Cardiolite performed January 23, 2000 revealing EF of 53%, no    ischemia with mild apical hypokinesis.  4. A 2-D echo performed January 22, 2000 revealing an EF of 55-65% with mild    aortic regurgitation and mildly dilated left atrium. 5. History of hypertension which has been difficult to control. 6. Frequent PVCs as well as bigeminy at time of admission. 7. Remote tobacco history. 8. Abnormal cholesterol profile, treated with Niaspan. DD:   01/23/00 TD:  01/23/00 Job: [id number]

## 2010-11-06 NOTE — H&P (Signed)
[place]. [facility]  Patient:    Patrick Sosa, Patrick Sosa                           MRN: [id number] Proc. Date: 01/22/00 Adm. Date:  01/22/00 Attending:  Valetta Mole. Swords, M.D. [facility]                         History and Physical  DATE OF BIRTH:  04-03-1946  HISTORY OF PRESENT ILLNESS:  Patrick Sosa is a 65 year old male whose medical problems include difficult to control hypertension.  He has recently been switched from Tiazac (caused periodontal disease) to Micardis.  Since that time he has noted a gradually worsening hypertension (he monitors it at home). Blood pressure is elevated and he has also noted increasing palpitations and difficulty sleeping.  He thinks his insomnia is because his heart feels irregular at times when he is sleeping.  More concerning is that over the past week he has described intermittent chest tightness.  He denies that this is exertional.  The pain does not radiate.  He has no associated nausea or vomiting, diaphoresis, or shortness of breath.  He describes his discomfort as a pressure.  He does have risk factors for coronary disease including AIDS, hypertension, family history (father deceased _______ with an MI at age 38), also has hyperlipidemia.  He states he has slight chest discomfort today, has been ongoing for two to three hours.  He again states the discomfort does not change with exertion.  PAST MEDICAL HISTORY:  Significant for hypertension, intermittent insomnia, and tonsillectomy and adenoidectomy.  CURRENT MEDICATIONS: 1. Accupril 40 mg p.o. q.d. 2. Atenolol 100 mg p.o. b.i.d. 3. Allegra p.r.n. 4. Niaspan 1000 mg p.o. q.h.s. 5. Micardis 40 mg p.o. q.d. 6. Aspirin a day (he has already had his aspirin today).  FAMILY HISTORY:  Father had an MI at age 74, deceased at age 17 from pancreatic cancer.  Also had hypertension.  Mother is alive with dementia at the age of 69.  He has two sisters, both with  hypertension.  SOCIAL HISTORY:  He is married, has three ______ children.  He works as an Airline pilot for a [employer]  REVIEW OF SYSTEMS:  He denies any abdominal pain, dysuria, or any other complaints in the review of systems.  PHYSICAL EXAMINATION:  VITAL SIGNS:  Weight 206, pulse 60, respirations 18, blood pressure 190/120. I recalculated his blood pressure at 195/110.  GENERAL:  In general he appears to be a well-developed, well-nourished male in no acute distress.  HEENT:  Atraumatic, normocephalic.  Extraocular muscles are intact. Conjunctivae are pink.  Sclerae are white.  Oropharynx are moist.  Tympanic membranes clear.  NECK:  Supple without any lymphadenopathy, thyromegaly, jugular venous distention or carotid bruits.  CHEST:  Clear to auscultation.  There is no increased work of breathing. There is no dullness to percussion.  CARDIAC:  S1 and S2 are normal without murmurs or gallops.  It is worth noting he does have a S4 on is cardiac examination.  ABDOMEN:  Examination, active bowel sounds, soft, nontender.  Abdominal aorta feels normal.  Femoral arteries are normal without bruits.  EXTREMITIES:  Pedal pulses are easy to palpate.  NEUROLOGICAL:  He is alert and oriented.  SKIN:  On dermatologic examination he does have two hyperpigmented moles on his abdomen.  Electrocardiogram demonstrates normal sinus rhythm with multiple unifocal PVCs with episodes  of bigeminy.  ASSESSMENT AND PLAN:  Chest discomfort with hypertensive urgency and a change in his electrocardiogram.  He does have multiple coronary risk factors.  I think he needs further evaluation.  My guess is he does not have unstable angina but I think he needs to be treated like that.  He refuses Emergency Medical Services transfer at the hospital.  His wife has come to the hospital to transfer him.  For now, we will continue his current blood pressure medications, add intravenous nitroglycerin to  see if it resolves his chest discomfort as well as control of blood pressure.  We need to keep in mind that he has had trouble with calcium channel blockers.  I have called cardiology.  They will see him when Patrick Sosa gets to the emergency room (no beds available).  I have sent orders with Patrick Sosa and his wife and discussed this in detail with them.  I think it may be reasonable to perform coronary angiography on Patrick Sosa, and that was also discussed with him. DD:  01/22/00 TD:  01/22/00 Job: [id number]

## 2011-02-15 ENCOUNTER — Other Ambulatory Visit: Payer: Self-pay | Admitting: Cardiology

## 2011-02-15 DIAGNOSIS — I82409 Acute embolism and thrombosis of unspecified deep veins of unspecified lower extremity: Secondary | ICD-10-CM

## 2011-02-15 DIAGNOSIS — I10 Essential (primary) hypertension: Secondary | ICD-10-CM

## 2011-02-15 DIAGNOSIS — E7800 Pure hypercholesterolemia, unspecified: Secondary | ICD-10-CM

## 2011-02-17 ENCOUNTER — Other Ambulatory Visit (INDEPENDENT_AMBULATORY_CARE_PROVIDER_SITE_OTHER): Payer: 59 | Admitting: *Deleted

## 2011-02-17 ENCOUNTER — Ambulatory Visit (INDEPENDENT_AMBULATORY_CARE_PROVIDER_SITE_OTHER): Payer: 59 | Admitting: Cardiology

## 2011-02-17 ENCOUNTER — Encounter: Payer: Self-pay | Admitting: Cardiology

## 2011-02-17 ENCOUNTER — Other Ambulatory Visit: Payer: Self-pay | Admitting: Cardiology

## 2011-02-17 VITALS — BP 136/85 | HR 80 | Ht 74.0 in | Wt 215.2 lb

## 2011-02-17 DIAGNOSIS — E7800 Pure hypercholesterolemia, unspecified: Secondary | ICD-10-CM

## 2011-02-17 DIAGNOSIS — I82409 Acute embolism and thrombosis of unspecified deep veins of unspecified lower extremity: Secondary | ICD-10-CM

## 2011-02-17 DIAGNOSIS — I10 Essential (primary) hypertension: Secondary | ICD-10-CM

## 2011-02-17 DIAGNOSIS — I119 Hypertensive heart disease without heart failure: Secondary | ICD-10-CM

## 2011-02-17 LAB — CBC WITH DIFFERENTIAL/PLATELET
Basophils Absolute: 0 10*3/uL (ref 0.0–0.1)
Eosinophils Relative: 3.6 % (ref 0.0–5.0)
MCV: 103.3 fl — ABNORMAL HIGH (ref 78.0–100.0)
Monocytes Absolute: 0.6 10*3/uL (ref 0.1–1.0)
Monocytes Relative: 9.5 % (ref 3.0–12.0)
Neutrophils Relative %: 59.2 % (ref 43.0–77.0)
Platelets: 169 10*3/uL (ref 150.0–400.0)
RDW: 13.4 % (ref 11.5–14.6)
WBC: 6 10*3/uL (ref 4.5–10.5)

## 2011-02-17 LAB — HEPATIC FUNCTION PANEL
ALT: 37 U/L (ref 0–53)
Bilirubin, Direct: 0.2 mg/dL (ref 0.0–0.3)
Total Bilirubin: 0.9 mg/dL (ref 0.3–1.2)

## 2011-02-17 LAB — LIPID PANEL
Cholesterol: 117 mg/dL (ref 0–200)
HDL: 29.1 mg/dL — ABNORMAL LOW (ref >39.00)
Triglycerides: 216 mg/dL — ABNORMAL HIGH (ref 0.0–149.0)
VLDL: 43.2 mg/dL — ABNORMAL HIGH (ref 0.0–40.0)

## 2011-02-17 LAB — BASIC METABOLIC PANEL
BUN: 17 mg/dL (ref 6–23)
Creatinine, Ser: 1.4 mg/dL (ref 0.4–1.5)
GFR: 54 mL/min — ABNORMAL LOW (ref >60.00)

## 2011-02-17 NOTE — Assessment & Plan Note (Signed)
Patient has a past history of hypercholesterolemia.  We are checking his blood work today.  He remains on Pravastatin and niacin.  He's not having any myalgias from the pravastatin.

## 2011-02-17 NOTE — Assessment & Plan Note (Signed)
No chest pain or dizziness.  No headaches.

## 2011-02-17 NOTE — Assessment & Plan Note (Signed)
No recurrence of DVT symptoms since stopping warfarin one year ago.  He is very careful if he takes a long car trip to get out of the car frequently and walk around

## 2011-02-17 NOTE — Progress Notes (Signed)
Gennie Alma Date of Birth:  1946-02-12 [facility] / [facility] [address]   [address] [place], [place]  [address] [phone]           Fax   [phone]  HPI: This pleasant 65 year old gentleman is seen for a scheduled 4 month followup office visit.  He has a past history of essential hypertension and a history of hypercholesterolemia.  He also has a past history of deep vein thrombosis.  He has been off Coumadin now for a year and his had no recurrent episodes of DVT.  He does have a history of polycythemia and donates on a regular basis to the [employer].  The patient denies any chest pain or shortness of breath.  He is on pravastatin for his cholesterol.  He has not been experiencing any myalgias.  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  Current Outpatient Prescriptions  Medication Sig Dispense Refill  . aspirin 325 MG tablet Take 325 mg by mouth daily.        Marland Kitchen dimenhyDRINATE (DRAMAMINE) 50 MG tablet Take 50 mg by mouth every 8 (eight) hours as needed.        . metoprolol tartrate (LOPRESSOR) 25 MG tablet Take 25 mg by mouth 2 (two) times daily.        . pravastatin (PRAVACHOL) 20 MG tablet Take 20 mg by mouth daily.        Marland Kitchen spironolactone (ALDACTONE) 25 MG tablet Take 25 mg by mouth daily.        . niacin (NIASPAN) 1000 MG CR tablet Take 1,000 mg by mouth at bedtime.          No Known Allergies  Patient Active Problem List  Diagnoses  . FACTOR V DEFICIENCY  . HYPERTENSION  . DVT  . ONYCHOMYCOSIS  . Hypertension  . Polycythemia  . DVT (deep venous thrombosis)  . Hypercholesterolemia  . Dry cough    History  Smoking status  . Former Smoker  . Quit date: 10/12/1987  Smokeless tobacco  . Not on file    History  Alcohol Use No    Family History  Problem Relation Age of Onset  . Alzheimer's disease Mother   . Pancreatic cancer Father   . Hypertension Father   . Heart attack Father   .  Hypertension Sister     Review of Systems: The patient denies any heat or cold intolerance.  No weight gain or weight loss.  The patient denies headaches or blurry vision.  There is no cough or sputum production.  The patient denies dizziness.  There is no hematuria or hematochezia.  The patient denies any muscle aches or arthritis.  The patient denies any rash.  The patient denies frequent falling or instability.  There is no history of depression or anxiety.  All other systems were reviewed and are negative.   Physical Exam: Filed Vitals:   02/17/11 0832  BP: 136/85  Pulse: 80   The general appearance reveals a well-developed gentleman in no distress.The head and neck exam reveals pupils equal and reactive.  Extraocular movements are full.  There is no scleral icterus.  The mouth and pharynx are normal.  The neck is supple.  The carotids reveal no bruits.  The jugular venous pressure is normal.  The  thyroid is not enlarged.  There is no lymphadenopathy.  The chest is clear to percussion and auscultation.  There are  no rales or rhonchi.  Expansion of the chest is symmetrical.  The precordium is quiet.  The first heart sound is normal.  The second heart sound is physiologically split.  There is no murmur gallop rub or click.  There is no abnormal lift or heave.  The abdomen is soft and nontender.  The bowel sounds are normal.  The liver and spleen are not enlarged.  There are no abdominal masses.  There are no abdominal bruits.  Extremities reveal good pedal pulses.  There is no phlebitis or edema.  There is no cyanosis or clubbing.  Strength is normal and symmetrical in all extremities.  There is no lateralizing weakness.  There are no sensory deficits.  The skin is warm and dry.  There is no rash.     Assessment / Plan: Continue same medication.  Recheck in 4 months for followup office visit and lab work.  Try to lose weight.  Continue walking for exercise.

## 2011-02-18 ENCOUNTER — Telehealth: Payer: Self-pay | Admitting: *Deleted

## 2011-02-18 NOTE — Telephone Encounter (Signed)
Advised of labs 

## 2011-02-18 NOTE — Telephone Encounter (Signed)
Message copied by Eugenia Pancoast on Thu Feb 18, 2011 12:28 PM ------      Message from: Cassell Clement      Created: Wed Feb 17, 2011  9:38 PM       The LDL is very good at 79

## 2011-02-18 NOTE — Telephone Encounter (Signed)
Message copied by Eugenia Pancoast on Thu Feb 18, 2011 12:29 PM ------      Message from: Cassell Clement      Created: Wed Feb 17, 2011  9:37 PM       Please report.The cholesterol was 117.  The triglycerides are improving but are still high at 216 sodium he is to continue to get his weight down and watch carbs.The kidney function is normal and the potassium is 4.2 which is normal.  The hemoglobin is 16.4 which is acceptable.  The liver tests are stable            Continue present medication

## 2011-03-24 ENCOUNTER — Other Ambulatory Visit: Payer: Self-pay | Admitting: Cardiology

## 2011-03-25 LAB — POCT I-STAT, CHEM 8
Chloride: 102 mEq/L (ref 96–112)
Glucose, Bld: 101 mg/dL — ABNORMAL HIGH (ref 70–99)
HCT: 48 % (ref 39.0–52.0)
Hemoglobin: 16.3 g/dL (ref 13.0–17.0)
Potassium: 4.5 mEq/L (ref 3.5–5.1)
Sodium: 137 mEq/L (ref 135–145)

## 2011-03-25 LAB — DIFFERENTIAL
Basophils Absolute: 0 10*3/uL (ref 0.0–0.1)
Basophils Relative: 0 % (ref 0–1)
Eosinophils Absolute: 0.1 10*3/uL (ref 0.0–0.7)
Monocytes Relative: 10 % (ref 3–12)
Neutro Abs: 9.7 10*3/uL — ABNORMAL HIGH (ref 1.7–7.7)
Neutrophils Relative %: 78 % — ABNORMAL HIGH (ref 43–77)

## 2011-03-25 LAB — POCT URINALYSIS DIP (DEVICE)
Glucose, UA: NEGATIVE mg/dL
Nitrite: NEGATIVE
Protein, ur: 30 mg/dL — AB
Urobilinogen, UA: 0.2 mg/dL (ref 0.0–1.0)

## 2011-03-25 LAB — CBC
MCHC: 35.1 g/dL (ref 30.0–36.0)
Platelets: 169 10*3/uL (ref 150–400)
RBC: 4.63 MIL/uL (ref 4.22–5.81)

## 2011-03-25 LAB — AMYLASE: Amylase: 99 U/L (ref 27–131)

## 2011-04-27 ENCOUNTER — Emergency Department (INDEPENDENT_AMBULATORY_CARE_PROVIDER_SITE_OTHER): Payer: 59

## 2011-04-27 ENCOUNTER — Other Ambulatory Visit: Payer: Self-pay

## 2011-04-27 ENCOUNTER — Emergency Department (HOSPITAL_COMMUNITY)
Admission: EM | Admit: 2011-04-27 | Discharge: 2011-04-27 | Disposition: A | Discharge status: Home / Self Care | Payer: 59 | Source: Home / Self Care

## 2011-04-27 ENCOUNTER — Encounter (HOSPITAL_COMMUNITY): Payer: Self-pay | Admitting: *Deleted

## 2011-04-27 DIAGNOSIS — J189 Pneumonia, unspecified organism: Secondary | ICD-10-CM

## 2011-04-27 MED ORDER — HYDROCOD POLST-CHLORPHEN POLST 10-8 MG/5ML PO LQCR: 5.0000 mL | Freq: Two times a day (BID) | ORAL | Status: DC | PRN

## 2011-04-27 MED ORDER — HYDROCODONE-ACETAMINOPHEN 7.5-325 MG/15ML PO SOLN: 15.0000 mL | Freq: Four times a day (QID) | ORAL | Status: DC | PRN

## 2011-04-27 MED ORDER — IBUPROFEN 800 MG PO TABS: 800.0000 mg | ORAL_TABLET | Freq: Three times a day (TID) | ORAL | Status: AC

## 2011-04-27 MED ORDER — GUAIFENESIN ER 600 MG PO TB12: 1200.0000 mg | ORAL_TABLET | Freq: Two times a day (BID) | ORAL | Status: DC

## 2011-04-27 MED ORDER — AMOXICILLIN-POT CLAVULANATE 500-125 MG PO TABS: 1.0000 | ORAL_TABLET | Freq: Three times a day (TID) | ORAL | Status: DC

## 2011-04-27 MED ORDER — AMOXICILLIN-POT CLAVULANATE 500-125 MG PO TABS: 1.0000 | ORAL_TABLET | Freq: Three times a day (TID) | ORAL | Status: AC

## 2011-04-27 NOTE — ED Notes (Signed)
Pt  Reports  Shortness of  Breath     -  He  Reports  Had  Coughing  Spell  yest     And  Today    Noticed  Pain     r  Side      Worse  From  Cough  Also  Has  Some shortness of breath and  Unable  To take  A  Deep  Breath

## 2011-04-27 NOTE — ED Provider Notes (Signed)
History     CSN: [id number] Arrival date & time: 04/27/2011  2:37 PM   First MD Initiated Contact with Patient 04/27/11 1513      Chief Complaint  Patient presents with  . Shortness of Breath    (Consider location/radiation/quality/duration/timing/severity/associated sxs/prior treatment) Patient is a 65 y.o. male presenting with cough. The history is provided by the patient.  Cough This is a new problem. Episode onset: over 2 weeks with dry cough spells associated with right side pleuritic chest pain with deep breath also with chiils and subjective fever at night for 2 days. The cough is non-productive. Maximum temperature: subjective. Associated symptoms include chills and shortness of breath. Pertinent negatives include no chest pain, no sweats, no weight loss, no rhinorrhea, no sore throat, no wheezing and no eye redness. He has tried an opioid for the symptoms. The treatment provided mild relief. He is not a smoker. His past medical history does not include bronchitis, COPD or asthma.    Past Medical History  Diagnosis Date  . Hypertension   . Polycythemia   . DVT (deep venous thrombosis)   . Hypercholesterolemia   . Dry cough   . DVT (deep venous thrombosis)     Past Surgical History  Procedure Date  . Tonsillectomy     Family History  Problem Relation Age of Onset  . Alzheimer's disease Mother   . Pancreatic cancer Father   . Hypertension Father   . Heart attack Father   . Hypertension Sister     History  Substance Use Topics  . Smoking status: Former Smoker    Quit date: 10/12/1987  . Smokeless tobacco: Not on file  . Alcohol Use: Yes     rarely      Review of Systems  Constitutional: Positive for chills. Negative for weight loss.  HENT: Negative for sore throat and rhinorrhea.   Eyes: Negative for redness.  Respiratory: Positive for cough and shortness of breath. Negative for chest tightness and wheezing.   Cardiovascular: Negative for chest pain,  palpitations and leg swelling.  Skin: Negative for rash.    Allergies  Review of patient's allergies indicates no known allergies.  Home Medications   Current Outpatient Rx  Name Route Sig Dispense Refill  . PRAVASTATIN SODIUM 20 MG PO TABS Oral Take 20 mg by mouth daily.      . AMOXICILLIN-POT CLAVULANATE 500-125 MG PO TABS Oral Take 1 tablet (500 mg total) by mouth 3 (three) times daily. 30 tablet NO  . ASPIRIN 325 MG PO TABS Oral Take 325 mg by mouth daily.      Marland Kitchen DIMENHYDRINATE 50 MG PO TABS Oral Take 50 mg by mouth every 8 (eight) hours as needed.      . GUAIFENESIN 600 MG PO TB12 Oral Take 2 tablets (1,200 mg total) by mouth 2 (two) times daily. 14 tablet NO  . HYDROCODONE-ACETAMINOPHEN 7.5-325 MG/15ML PO SOLN Oral Take 15 mLs by mouth every 6 (six) hours as needed for pain. 120 mL 0  . IBUPROFEN 800 MG PO TABS Oral Take 1 tablet (800 mg total) by mouth 3 (three) times daily. 21 tablet 0  . METOPROLOL TARTRATE 25 MG PO TABS Oral Take 25 mg by mouth 2 (two) times daily.     Marland Kitchen NIACIN (ANTIHYPERLIPIDEMIC) 1000 MG PO TBCR Oral Take 1,000 mg by mouth at bedtime.     . SPIRONOLACTONE 25 MG PO TABS  TAKE 1 TABLET BY MOUTH EVERY DAY 30 tablet 11  BP 170/60  Pulse 79  Temp(Src) 98.6 F (37 C) (Oral)  Resp 24  SpO2 95%  Physical Exam  Constitutional: He is oriented to person, place, and time. He appears well-developed and well-nourished. No distress.  HENT:  Mouth/Throat: Oropharynx is clear and moist. No oropharyngeal exudate.       Nasal congestion. Mild pharyngeal erythema.  Neck: No JVD present.  Cardiovascular: Normal rate, regular rhythm and normal heart sounds.  Exam reveals no gallop and no friction rub.   No murmur heard. Pulmonary/Chest: Effort normal. No respiratory distress. He has rales.       Fine rales in bases bilateral more right than left. No tachypnea. No orthopnea, No wheezing.  Neurological: He is alert and oriented to person, place, and time.  Skin: No  rash noted.    ED Course  Procedures (including critical care time)  Labs Reviewed - No data to display Dg Chest 2 View  04/27/2011  *RADIOLOGY REPORT*  Clinical Data: 65 year old male with shortness of breath and cough.  CHEST - 2 VIEW  Comparison: 08/24/2007  Findings: The cardiomediastinal silhouette is unremarkable. Right lower lobe opacity is identified suspicious for pneumonia. There is no evidence of pleural effusion or pneumothorax. The left lung is clear. No acute bony abnormalities are noted.  IMPRESSION: Right lower lobe opacity suspicious for pneumonia.  Radiographic follow up to resolution is recommended.  Original Report Authenticated By: Rosendo Gros, M.D.     1. Pneumonia, community acquired       MDM  EKG: 81 bpm, NSR, No Q waves, No ST changes. Incomplete RBBB similar in prior EKG. Tx: with Augmentin x 10 days as well as symptomatic tx for cough asked to return in 48h for recheck or earlier if worsening symptoms despite following tx.          Shan Valdes Moreno-Coll 04/28/11 2125

## 2011-04-27 NOTE — ED Notes (Signed)
Pt  Has  Crackles  r  Base  With  Pain   When he  Takes  deepbreath    Associated  With  Shortness of  Breath  Cap refill is  Present    Yet      Is      Sluggish      He is  Awake  And  Alert          He   denys  Any  Chest   Pain or  Shortness of  Breath

## 2011-05-05 ENCOUNTER — Telehealth: Payer: Self-pay | Admitting: *Deleted

## 2011-05-05 NOTE — Telephone Encounter (Signed)
No availability for appt today, but pt would like to come tomorrow.  Appt. Scheduled.

## 2011-05-05 NOTE — Telephone Encounter (Signed)
Voicemail  Pt states he has been recently diagnosed with pneumonia at [facility].  Has been antibiotics for six days and thinks he has a fever.  Requesting a call back from triage.

## 2011-05-06 ENCOUNTER — Encounter: Payer: Self-pay | Admitting: Internal Medicine

## 2011-05-06 ENCOUNTER — Ambulatory Visit (INDEPENDENT_AMBULATORY_CARE_PROVIDER_SITE_OTHER): Payer: 59 | Admitting: Internal Medicine

## 2011-05-06 DIAGNOSIS — I82409 Acute embolism and thrombosis of unspecified deep veins of unspecified lower extremity: Secondary | ICD-10-CM

## 2011-05-06 DIAGNOSIS — I10 Essential (primary) hypertension: Secondary | ICD-10-CM

## 2011-05-06 MED ORDER — HYDROCODONE-ACETAMINOPHEN 7.5-500 MG PO TABS: 1.0000 | ORAL_TABLET | Freq: Four times a day (QID) | ORAL | Status: AC | PRN

## 2011-05-06 NOTE — Progress Notes (Signed)
Subjective:    Patient ID: Patrick Sosa, male    DOB: 1946-04-27, 65 y.o.   MRN: [id number]  HPI   65 year old patient who was diagnosed and treated at the urgent care Center for a right lower lobe pneumonia. He is completing Augmentin therapy. He has improved but still has nonproductive cough and right chest wall discomfort aggravated by coughing. No fever or chills. He does have a history of treated hypertension and blood pressure noted to be elevated today. He is scheduled for followup with his cardiologist the end of next month. No recent home blood pressure monitoring. No fever chills or sputum production. Denies much in the way of rhinorrhea or postnasal drip   Past Medical History  Diagnosis Date  . Hypertension   . Polycythemia   . DVT (deep venous thrombosis)   . Hypercholesterolemia   . Dry cough   . DVT (deep venous thrombosis)     History   Social History  . Marital Status: Married    Spouse Name: N/A    Number of Children: N/A  . Years of Education: N/A   Occupational History  . Not on file.   Social History Main Topics  . Smoking status: Former Smoker    Quit date: 10/12/1987  . Smokeless tobacco: Never Used  . Alcohol Use: Yes     rarely  . Drug Use: No  . Sexually Active: Not on file   Other Topics Concern  . Not on file   Social History Narrative  . No narrative on file    Past Surgical History  Procedure Date  . Tonsillectomy     Family History  Problem Relation Age of Onset  . Alzheimer's disease Mother   . Pancreatic cancer Father   . Hypertension Father   . Heart attack Father   . Hypertension Sister     No Known Allergies  Current Outpatient Prescriptions on File Prior to Visit  Medication Sig Dispense Refill  . amoxicillin-clavulanate (AUGMENTIN) 500-125 MG per tablet Take 1 tablet (500 mg total) by mouth 3 (three) times daily.  30 tablet  NO  . aspirin 325 MG tablet Take 325 mg by mouth daily.        Marland Kitchen dimenhyDRINATE (DRAMAMINE)  50 MG tablet Take 50 mg by mouth every 8 (eight) hours as needed.        Marland Kitchen ibuprofen (ADVIL,MOTRIN) 800 MG tablet Take 1 tablet (800 mg total) by mouth 3 (three) times daily.  21 tablet  0  . metoprolol tartrate (LOPRESSOR) 25 MG tablet Take 25 mg by mouth 2 (two) times daily.       . pravastatin (PRAVACHOL) 20 MG tablet Take 20 mg by mouth daily.        Marland Kitchen spironolactone (ALDACTONE) 25 MG tablet TAKE 1 TABLET BY MOUTH EVERY DAY  30 tablet  11  . guaiFENesin (MUCINEX) 600 MG 12 hr tablet Take 2 tablets (1,200 mg total) by mouth 2 (two) times daily.  14 tablet  NO  . hydrocodone-acetaminophen (HYCET) 7.5-325 MG/15ML solution Take 15 mLs by mouth every 6 (six) hours as needed for pain.  120 mL  0    BP 170/100  Temp(Src) 98.1 F (36.7 C) (Oral)  Wt 219 lb (99.338 kg)      Review of Systems  Constitutional: Negative for fever, chills, appetite change and fatigue.  HENT: Negative for hearing loss, ear pain, congestion, sore throat, trouble swallowing, neck stiffness, dental problem, voice change and tinnitus.  Eyes: Negative for pain, discharge and visual disturbance.  Respiratory: Negative for cough, chest tightness, wheezing and stridor.   Cardiovascular: Negative for chest pain, palpitations and leg swelling.  Gastrointestinal: Negative for nausea, vomiting, abdominal pain, diarrhea, constipation, blood in stool and abdominal distention.  Genitourinary: Negative for urgency, hematuria, flank pain, discharge, difficulty urinating and genital sores.  Musculoskeletal: Negative for myalgias, back pain, joint swelling, arthralgias and gait problem.  Skin: Negative for rash.  Neurological: Negative for dizziness, syncope, speech difficulty, weakness, numbness and headaches.  Hematological: Negative for adenopathy. Does not bruise/bleed easily.  Psychiatric/Behavioral: Negative for behavioral problems and dysphoric mood. The patient is not nervous/anxious.        Objective:   Physical Exam   Constitutional: He is oriented to person, place, and time. He appears well-developed.  HENT:  Head: Normocephalic.  Right Ear: External ear normal.  Left Ear: External ear normal.  Eyes: Conjunctivae and EOM are normal.  Neck: Normal range of motion.  Cardiovascular: Normal rate and normal heart sounds.   Pulmonary/Chest: Effort normal and breath sounds normal. No respiratory distress. He has no wheezes. He has no rales.       Chest is clear. O2 saturation 98%.  Abdominal: Bowel sounds are normal.  Musculoskeletal: Normal range of motion. He exhibits no edema and no tenderness.  Neurological: He is alert and oriented to person, place, and time.  Psychiatric: He has a normal mood and affect. His behavior is normal.          Assessment & Plan:    Resolving right lower lobe pneumonia. He will complete antibiotic therapy he will be treated with Mucinex DM. He is also given a prescription for hydrocodone for cough Hypertension. Blood pressure has been running high since his acute illness he has been asked to continue closer at home blood pressure monitoring. If blood pressures consistently in excess of 150/90 will increase his metoprolol to 50 mg twice daily. He will be seen by cardiology next month

## 2011-05-06 NOTE — Patient Instructions (Signed)
Get plenty of rest, Drink lots of  clear liquids, and use Tylenol or ibuprofen for fever and discomfort.    Mucinex DM  One twice daily  Please check your blood pressure on a regular basis.  If it is consistently greater than 150/90, please make an office appointment.

## 2011-05-17 ENCOUNTER — Telehealth: Payer: Self-pay | Admitting: Family Medicine

## 2011-05-17 NOTE — Telephone Encounter (Signed)
Pulled from Triage vmail. States he had pneumonia 3wks ago. Is still having a lot of back/shoulder pain & wonders if he might have pleurisy. He cannot sleep. Please call to advise.

## 2011-05-18 NOTE — Telephone Encounter (Signed)
It's possible Send for CXR to make sure pneumonia resolved  Take ibuprofen 600 mg po tid for 5 days, take with food

## 2011-05-18 NOTE — Telephone Encounter (Signed)
Left message on machine for patient  To return our call 

## 2011-06-14 ENCOUNTER — Emergency Department (INDEPENDENT_AMBULATORY_CARE_PROVIDER_SITE_OTHER): Payer: 59

## 2011-06-14 ENCOUNTER — Emergency Department (HOSPITAL_COMMUNITY)
Admission: EM | Admit: 2011-06-14 | Discharge: 2011-06-14 | Disposition: A | Discharge status: Home / Self Care | Payer: 59 | Attending: Emergency Medicine | Admitting: Emergency Medicine

## 2011-06-14 ENCOUNTER — Emergency Department (HOSPITAL_COMMUNITY): Payer: 59

## 2011-06-14 ENCOUNTER — Encounter (HOSPITAL_COMMUNITY): Payer: Self-pay | Admitting: *Deleted

## 2011-06-14 ENCOUNTER — Emergency Department (INDEPENDENT_AMBULATORY_CARE_PROVIDER_SITE_OTHER)
Admission: EM | Admit: 2011-06-14 | Discharge: 2011-06-14 | Disposition: A | Discharge status: Nursing Home (Includes ICF) | Payer: 59 | Source: Home / Self Care | Attending: Emergency Medicine | Admitting: Emergency Medicine

## 2011-06-14 DIAGNOSIS — R109 Unspecified abdominal pain: Secondary | ICD-10-CM | POA: Insufficient documentation

## 2011-06-14 DIAGNOSIS — I10 Essential (primary) hypertension: Secondary | ICD-10-CM | POA: Insufficient documentation

## 2011-06-14 DIAGNOSIS — M545 Low back pain, unspecified: Secondary | ICD-10-CM | POA: Insufficient documentation

## 2011-06-14 DIAGNOSIS — J90 Pleural effusion, not elsewhere classified: Secondary | ICD-10-CM

## 2011-06-14 DIAGNOSIS — M549 Dorsalgia, unspecified: Secondary | ICD-10-CM

## 2011-06-14 DIAGNOSIS — E7800 Pure hypercholesterolemia, unspecified: Secondary | ICD-10-CM | POA: Insufficient documentation

## 2011-06-14 DIAGNOSIS — Z79899 Other long term (current) drug therapy: Secondary | ICD-10-CM | POA: Insufficient documentation

## 2011-06-14 DIAGNOSIS — Z86718 Personal history of other venous thrombosis and embolism: Secondary | ICD-10-CM | POA: Insufficient documentation

## 2011-06-14 DIAGNOSIS — Z7982 Long term (current) use of aspirin: Secondary | ICD-10-CM | POA: Insufficient documentation

## 2011-06-14 LAB — DIFFERENTIAL
Basophils Absolute: 0 10*3/uL (ref 0.0–0.1)
Eosinophils Absolute: 0.4 10*3/uL (ref 0.0–0.7)
Eosinophils Relative: 5 % (ref 0–5)
Lymphocytes Relative: 19 % (ref 12–46)
Monocytes Absolute: 1 10*3/uL (ref 0.1–1.0)

## 2011-06-14 LAB — POCT URINALYSIS DIP (DEVICE)
Glucose, UA: 100 mg/dL — AB
Hgb urine dipstick: NEGATIVE
Nitrite: NEGATIVE
Protein, ur: 100 mg/dL — AB
Specific Gravity, Urine: 1.025 (ref 1.005–1.030)
Urobilinogen, UA: 1 mg/dL (ref 0.0–1.0)
pH: 5.5 (ref 5.0–8.0)

## 2011-06-14 LAB — CBC
HCT: 45.4 % (ref 39.0–52.0)
MCH: 35.4 pg — ABNORMAL HIGH (ref 26.0–34.0)
MCV: 98.1 fL (ref 78.0–100.0)
Platelets: 168 10*3/uL (ref 150–400)
RDW: 12.8 % (ref 11.5–15.5)
WBC: 8.1 10*3/uL (ref 4.0–10.5)

## 2011-06-14 LAB — POCT I-STAT, CHEM 8
BUN: 17 mg/dL (ref 6–23)
Calcium, Ion: 1.2 mmol/L (ref 1.12–1.32)
Chloride: 103 mEq/L (ref 96–112)
Glucose, Bld: 94 mg/dL (ref 70–99)
HCT: 49 % (ref 39.0–52.0)
TCO2: 28 mmol/L (ref 0–100)

## 2011-06-14 MED ORDER — IOHEXOL 350 MG/ML SOLN: 100.0000 mL | Freq: Once | INTRAVENOUS | Status: DC | PRN

## 2011-06-14 MED ORDER — HYDROCODONE-ACETAMINOPHEN 5-325 MG PO TABS: 2.0000 | ORAL_TABLET | ORAL | Status: DC | PRN

## 2011-06-14 NOTE — ED Notes (Signed)
Pt sent to ed from [facility] for further eval of flank pain. Per report from [facility] pt was recently dx with pneumonia and now has pleural effusion and needs eval by pulmonary. Appears in nad

## 2011-06-14 NOTE — ED Provider Notes (Signed)
Medical screening examination/treatment/procedure(s) were conducted as a shared visit with non-physician practitioner(s) and myself.  I personally evaluated the patient during the encounter The patient is a 65 year old, male, with a history of pneumonia, kidney stones, and DVT 2 years ago, who presents to the emergency department after he was seen in the urgent care center.  He had presented there with right-sided flank pain.  He has a rare cough.  He denies recent travel, surgery or leg swelling.  He denies shortness of breath, or fevers.  His evaluation at the urgent care Center included a chest x-ray, which was unremarkable, and a urinalysis, which did not show blood.  She has a history of DVT in the past and he is right flank pain, with no other explanation.  We will do a CT angiogram to look for pulmonary embolism.  Since he has a creatinine level of 1.4.  In anticipation that the radiologist may not want to do a CT angiogram.  We will also perform a d-dimer.  Nicholes Stairs, MD 06/14/11 1242

## 2011-06-14 NOTE — ED Notes (Signed)
Pt  Reports  Pain r  Flank     -   He  Reports  He  Had pnuemonia     7  Weeks  Ago  Same  Side  Which  Resolved   -  Pt  States  The  Pain is aggrevated  By  Certain posistions  And  Movements   He  Says  He  Has  Had  Kidney  Stones  In past  But this  Does  Not  Feel the  Same  -  Does  Not  Appear  In severe  distress

## 2011-06-14 NOTE — ED Provider Notes (Signed)
History     CSN: [id number]  Arrival date & time 06/14/11  1132   First MD Initiated Contact with Patient 06/14/11 1159      Chief Complaint  Patient presents with  . Flank Pain    (Consider location/radiation/quality/duration/timing/severity/associated sxs/prior treatment) The history is provided by the patient.    Pt presents to the ED from [facility] for further evaulation. Pt has a history of pneumonia recently, also a past history of DVT in his legs one year ago. The patient is having lower right back pain and was diagnosed to have a pleural effusion on chest xray done at the [facility]. The patient is not tachycardic, febrile, or in any acute distress. The patient is resting   Past Medical History  Diagnosis Date  . Hypertension   . Polycythemia   . DVT (deep venous thrombosis)   . Hypercholesterolemia   . Dry cough   . DVT (deep venous thrombosis)     Past Surgical History  Procedure Date  . Tonsillectomy     Family History  Problem Relation Age of Onset  . Alzheimer's disease Mother   . Pancreatic cancer Father   . Hypertension Father   . Heart attack Father   . Cancer Father   . Hypertension Sister     History  Substance Use Topics  . Smoking status: Former Smoker    Quit date: 10/12/1987  . Smokeless tobacco: Never Used  . Alcohol Use: Yes     rarely      Review of Systems  All other systems reviewed and are negative.    Allergies  Review of patient's allergies indicates no known allergies.  Home Medications   Current Outpatient Rx  Name Route Sig Dispense Refill  . ASPIRIN 325 MG PO TABS Oral Take 325 mg by mouth daily.      Marland Kitchen METOPROLOL TARTRATE 25 MG PO TABS Oral Take 25 mg by mouth 2 (two) times daily.     . ADULT MULTIVITAMIN W/MINERALS CH Oral Take 1 tablet by mouth daily.      Marland Kitchen PRAVASTATIN SODIUM 20 MG PO TABS Oral Take 20 mg by mouth daily.      Marland Kitchen SPIRONOLACTONE 25 MG PO TABS  TAKE 1 TABLET BY MOUTH EVERY DAY 30 tablet 11  .  HYDROCODONE-ACETAMINOPHEN 5-325 MG PO TABS Oral Take 2 tablets by mouth every 4 (four) hours as needed for pain. 6 tablet 0    BP 150/88  Pulse 73  Temp(Src) 97.3 F (36.3 C) (Oral)  Resp 18  SpO2 93%  Physical Exam  Constitutional: He is oriented to person, place, and time. He appears well-developed and well-nourished.  HENT:  Head: Normocephalic and atraumatic.  Eyes: EOM are normal. Pupils are equal, round, and reactive to light.  Neck: Normal range of motion.  Cardiovascular: Normal rate, regular rhythm and normal heart sounds.   Pulmonary/Chest: Effort normal and breath sounds normal.  Abdominal: Soft. Bowel sounds are normal. He exhibits no distension. There is no tenderness. There is no rebound and no guarding.  Musculoskeletal: Normal range of motion.  Neurological: He is alert and oriented to person, place, and time.  Skin: Skin is warm and dry.    ED Course  Procedures (including critical care time)  Labs Reviewed  D-DIMER, QUANTITATIVE - Abnormal; Notable for the following:    D-Dimer, Quant 3.20 (*)    All other components within normal limits   Dg Chest 2 View  06/14/2011  *RADIOLOGY REPORT*  Clinical  Data: Cough  CHEST - 2 VIEW  Comparison: 06/14/2011  Findings: Heart size appears normal.  Atelectasis or scarring is noted within the right lung base.  Left lung is clear.  No effusions or interstitial edema.  IMPRESSION:  1.  Right lung base scarring or atelectasis.  Original Report Authenticated By: Rosealee Albee, M.D.   Ct Angio Chest W/cm &/or Wo Cm  06/14/2011  *RADIOLOGY REPORT*  Clinical Data:  History of DVT.  Upper back pain, right flank pain  CT ANGIOGRAPHY CHEST WITH CONTRAST  Technique:  Multidetector CT imaging of the chest was performed using the standard protocol during bolus administration of intravenous contrast.  Multiplanar CT image reconstructions including MIPs were obtained to evaluate the vascular anatomy.  Contrast:  100 ml Omnipaque 350   Comparison:  Chest radiograph 06/14/2011  Findings:  There are no filling defects within the pulmonary arteries to  suggest acute pulmonary embolism.  No acute findings of the aorta or great vessels.  No pericardial fluid.  The esophagus is normal.  Review of the lung parenchyma demonstrates atelectasis at the right lung base with a small of pleural effusion.  No evidence of pneumonia.   Airways are normal.  Limited view of the upper abdomen is unremarkable.  Limited view of the skeleton is unremarkable.  Review of the MIP images confirms the above findings.  IMPRESSION:  1.  No evidence of acute pulmonary embolism. 2.  Atelectasis and small effusion at the right lung bases.  Original Report Authenticated By: Genevive Bi, M.D.   Dg Chest Right Decubitus  06/14/2011  *RADIOLOGY REPORT*  Clinical Data: Cough., pneumonia.  CHEST - RIGHT DECUBITUS  Comparison: 04/27/2011  Findings: Moderate free flowing right pleural effusion noted on decubitus view.  IMPRESSION: Moderate free flowing right effusion.  Original Report Authenticated By: Cyndie Chime, M.D.     1. Back pain       MDM  I have discussed results with the patient and his wife. Pt has been given strict return to ED or see primary care provider instructions. Pt is currently comfortable and comfortable with plan.        Dorthula Matas, PA 06/14/11 1539

## 2011-06-14 NOTE — ED Provider Notes (Signed)
History     CSN: [id number]  Arrival date & time 06/14/11  6578   First MD Initiated Contact with Patient 06/14/11 0848      Chief Complaint  Patient presents with  . Flank Pain    (Consider location/radiation/quality/duration/timing/severity/associated sxs/prior treatment) HPI Comments: After I was diagnosed- I follow-up with my doctor he prescribed some mucin ex and hydrocodone for my cough, I did finish the antibiotic.." It hurts on my R side back here" No fevers, No chills, No SOB  Patient is a 65 y.o. male presenting with flank pain. The history is provided by the patient.  Flank Pain This is a recurrent (coughing since November) problem. The problem occurs daily. The problem has not changed since onset.Associated symptoms include chest pain. Pertinent negatives include no shortness of breath. The treatment provided no relief.    Past Medical History  Diagnosis Date  . Hypertension   . Polycythemia   . DVT (deep venous thrombosis)   . Hypercholesterolemia   . Dry cough   . DVT (deep venous thrombosis)     Past Surgical History  Procedure Date  . Tonsillectomy     Family History  Problem Relation Age of Onset  . Alzheimer's disease Mother   . Pancreatic cancer Father   . Hypertension Father   . Heart attack Father   . Cancer Father   . Hypertension Sister     History  Substance Use Topics  . Smoking status: Former Smoker    Quit date: 10/12/1987  . Smokeless tobacco: Never Used  . Alcohol Use: Yes     rarely      Review of Systems  Respiratory: Negative for shortness of breath.   Cardiovascular: Positive for chest pain.  Genitourinary: Positive for flank pain.    Allergies  Review of patient's allergies indicates no known allergies.  Home Medications   Current Outpatient Rx  Name Route Sig Dispense Refill  . ASPIRIN 325 MG PO TABS Oral Take 325 mg by mouth daily.      Marland Kitchen METOPROLOL TARTRATE 25 MG PO TABS Oral Take 25 mg by mouth 2 (two)  times daily.     . ADULT MULTIVITAMIN W/MINERALS CH Oral Take 1 tablet by mouth daily.      Marland Kitchen PRAVASTATIN SODIUM 20 MG PO TABS Oral Take 20 mg by mouth daily.      Marland Kitchen SPIRONOLACTONE 25 MG PO TABS  TAKE 1 TABLET BY MOUTH EVERY DAY 30 tablet 11    BP 144/72  Pulse 68  Temp(Src) 98.1 F (36.7 C) (Oral)  Resp 16  SpO2 100%  Physical Exam  Nursing note and vitals reviewed. Constitutional: He appears well-developed and well-nourished.  Non-toxic appearance. He does not have a sickly appearance. He does not appear ill. No distress.  HENT:  Head: Normocephalic.  Pulmonary/Chest: No accessory muscle usage. Not tachypneic. No respiratory distress. He has decreased breath sounds in the right lower field. He has no wheezes. He has no rhonchi. He has no rales.  Abdominal: Soft. Normal appearance. He exhibits no distension, no fluid wave and no mass. There is no hepatomegaly. There is no tenderness. There is no rigidity, no rebound, no guarding, no CVA tenderness, no tenderness at McBurney's point and negative Murphy's sign. No hernia.    ED Course  Procedures (including critical care time)  Labs Reviewed  POCT URINALYSIS DIP (DEVICE) - Abnormal; Notable for the following:    Glucose, UA 100 (*)    Bilirubin Urine SMALL (*)  Ketones, ur TRACE (*)    Protein, ur 100 (*)    All other components within normal limits  CBC - Abnormal; Notable for the following:    MCH 35.4 (*)    MCHC 36.1 (*)    All other components within normal limits  DIFFERENTIAL - Abnormal; Notable for the following:    Monocytes Relative 13 (*)    All other components within normal limits  POCT I-STAT, CHEM 8 - Abnormal; Notable for the following:    Creatinine, Ser 1.40 (*)    All other components within normal limits  LACTATE DEHYDROGENASE  SEDIMENTATION RATE   Dg Chest 2 View  06/14/2011  *RADIOLOGY REPORT*  Clinical Data: Cough  CHEST - 2 VIEW  Comparison: 06/14/2011  Findings: Heart size appears normal.   Atelectasis or scarring is noted within the right lung base.  Left lung is clear.  No effusions or interstitial edema.  IMPRESSION:  1.  Right lung base scarring or atelectasis.  Original Report Authenticated By: Rosealee Albee, M.D.   Dg Chest Right Decubitus  06/14/2011  *RADIOLOGY REPORT*  Clinical Data: Cough., pneumonia.  CHEST - RIGHT DECUBITUS  Comparison: 04/27/2011  Findings: Moderate free flowing right pleural effusion noted on decubitus view.  IMPRESSION: Moderate free flowing right effusion.  Original Report Authenticated By: Cyndie Chime, M.D.     1. Pleural effusion on right       MDM  Recurrent cough-recent pneumonia process. Moderate- pleural effusion discussed with pulmonologist on call- recommended CT- to consider aspiration-       Jimmie Molly, MD 06/14/11 [phone]

## 2011-06-15 NOTE — ED Provider Notes (Signed)
Medical screening examination/treatment/procedure(s) were performed by non-physician practitioner and as supervising physician I was immediately available for consultation/collaboration.  Anahlia Iseminger P Chyann Ambrocio, MD 06/15/11 0706 

## 2011-06-17 ENCOUNTER — Telehealth: Payer: Self-pay

## 2011-06-17 ENCOUNTER — Encounter (HOSPITAL_COMMUNITY): Payer: Self-pay | Admitting: *Deleted

## 2011-06-17 ENCOUNTER — Emergency Department (HOSPITAL_COMMUNITY)
Admission: EM | Admit: 2011-06-17 | Discharge: 2011-06-17 | Disposition: A | Discharge status: Home / Self Care | Payer: 59 | Attending: Emergency Medicine | Admitting: Emergency Medicine

## 2011-06-17 DIAGNOSIS — I10 Essential (primary) hypertension: Secondary | ICD-10-CM | POA: Insufficient documentation

## 2011-06-17 DIAGNOSIS — X58XXXA Exposure to other specified factors, initial encounter: Secondary | ICD-10-CM | POA: Insufficient documentation

## 2011-06-17 DIAGNOSIS — M79609 Pain in unspecified limb: Secondary | ICD-10-CM

## 2011-06-17 DIAGNOSIS — T148XXA Other injury of unspecified body region, initial encounter: Secondary | ICD-10-CM

## 2011-06-17 DIAGNOSIS — R209 Unspecified disturbances of skin sensation: Secondary | ICD-10-CM | POA: Insufficient documentation

## 2011-06-17 DIAGNOSIS — IMO0002 Reserved for concepts with insufficient information to code with codable children: Secondary | ICD-10-CM | POA: Insufficient documentation

## 2011-06-17 DIAGNOSIS — Z7982 Long term (current) use of aspirin: Secondary | ICD-10-CM | POA: Insufficient documentation

## 2011-06-17 DIAGNOSIS — E7800 Pure hypercholesterolemia, unspecified: Secondary | ICD-10-CM | POA: Insufficient documentation

## 2011-06-17 DIAGNOSIS — R0602 Shortness of breath: Secondary | ICD-10-CM | POA: Insufficient documentation

## 2011-06-17 DIAGNOSIS — Z86718 Personal history of other venous thrombosis and embolism: Secondary | ICD-10-CM | POA: Insufficient documentation

## 2011-06-17 DIAGNOSIS — Z79899 Other long term (current) drug therapy: Secondary | ICD-10-CM | POA: Insufficient documentation

## 2011-06-17 NOTE — Progress Notes (Signed)
*  PRELIMINARY RESULTS* Left lower extremity venous duplex completed. No evidence of DVT, superficial thrombosis, or Baker's cyst.  Orchid Glassberg, [place] D 06/17/2011, 3:10 PM

## 2011-06-17 NOTE — ED Notes (Signed)
Patient transported to Vascular Lab,

## 2011-06-17 NOTE — ED Provider Notes (Signed)
History     CSN: [id number]  Arrival date & time 06/17/11  1131   First MD Initiated Contact with Patient 06/17/11 1227      Chief Complaint  Patient presents with  . Leg Pain    (Consider location/radiation/quality/duration/timing/severity/associated sxs/prior treatment) HPI  Past Medical History  Diagnosis Date  . Hypertension   . Polycythemia   . DVT (deep venous thrombosis)   . Hypercholesterolemia   . Dry cough   . DVT (deep venous thrombosis)     Past Surgical History  Procedure Date  . Tonsillectomy     Family History  Problem Relation Age of Onset  . Alzheimer's disease Mother   . Pancreatic cancer Father   . Hypertension Father   . Heart attack Father   . Cancer Father   . Hypertension Sister     History  Substance Use Topics  . Smoking status: Former Smoker    Quit date: 10/12/1987  . Smokeless tobacco: Never Used  . Alcohol Use: Yes     rarely      Review of Systems  Allergies  Review of patient's allergies indicates no known allergies.  Home Medications   Current Outpatient Rx  Name Route Sig Dispense Refill  . ASPIRIN 325 MG PO TABS Oral Take 325 mg by mouth daily.      Marland Kitchen DIMENHYDRINATE 50 MG PO TABS Oral Take 75 mg by mouth at bedtime as needed. For sleep     . HYDROCODONE-ACETAMINOPHEN 5-325 MG PO TABS Oral Take 2 tablets by mouth every 4 (four) hours as needed for pain. 6 tablet 0  . METOPROLOL TARTRATE 25 MG PO TABS Oral Take 25 mg by mouth 2 (two) times daily.     . ADULT MULTIVITAMIN W/MINERALS CH Oral Take 1 tablet by mouth daily.      Marland Kitchen PRAVASTATIN SODIUM 20 MG PO TABS Oral Take 20 mg by mouth daily.      Marland Kitchen SPIRONOLACTONE 25 MG PO TABS  TAKE 1 TABLET BY MOUTH EVERY DAY 30 tablet 11    BP 157/83  Pulse 72  Temp(Src) 98 F (36.7 C) (Oral)  Resp 16  SpO2 96%  Physical Exam  ED Course  Procedures (including critical care time)  Labs Reviewed - No data to display No results found.   No diagnosis found.    MDM       See resident H and P        Suzi Roots, MD 06/19/11 [phone]

## 2011-06-17 NOTE — ED Notes (Signed)
Pt reports pain started today behind left knee radiating into left calf. Pt reports he was here on Monday to rule out PE. Pt reports hx of DVTs. Denies sob.

## 2011-06-17 NOTE — ED Notes (Signed)
Patient returned from Vascular lab 

## 2011-06-17 NOTE — ED Provider Notes (Signed)
History     CSN: [id number]  Arrival date & time 06/17/11  1131   First MD Initiated Contact with Patient 06/17/11 1227      Chief Complaint  Patient presents with  . Leg Pain    (Consider location/radiation/quality/duration/timing/severity/associated sxs/prior treatment) Patient is a 65 y.o. male presenting with leg pain. The history is provided by the patient. No language interpreter was used.  Leg Pain  The incident occurred more than 2 days ago. The incident occurred at home. There was no injury mechanism. The pain is present in the left leg (Posterior knee). The quality of the pain is described as aching. The pain is at a severity of 4/10. The pain is mild. The pain has been constant since onset. Associated symptoms include tingling (Mild). Pertinent negatives include no numbness, no inability to bear weight, no loss of motion, no muscle weakness and no loss of sensation. He reports no foreign bodies present. The symptoms are aggravated by nothing. He has tried nothing for the symptoms. The treatment provided no relief.    Past Medical History  Diagnosis Date  . Hypertension   . Polycythemia   . DVT (deep venous thrombosis)   . Hypercholesterolemia   . Dry cough   . DVT (deep venous thrombosis)     Past Surgical History  Procedure Date  . Tonsillectomy     Family History  Problem Relation Age of Onset  . Alzheimer's disease Mother   . Pancreatic cancer Father   . Hypertension Father   . Heart attack Father   . Cancer Father   . Hypertension Sister     History  Substance Use Topics  . Smoking status: Former Smoker    Quit date: 10/12/1987  . Smokeless tobacco: Never Used  . Alcohol Use: Yes     rarely      Review of Systems  Constitutional: Negative for fever and chills.  Respiratory: Positive for shortness of breath (4 days ago, had workup for pulmonary embolus which was negative). Negative for cough.   Cardiovascular: Negative for chest pain and leg  swelling.  Gastrointestinal: Negative for nausea, vomiting, abdominal pain and diarrhea.  Neurological: Positive for tingling (Mild). Negative for numbness.  All other systems reviewed and are negative.    Allergies  Review of patient's allergies indicates no known allergies.  Home Medications   Current Outpatient Rx  Name Route Sig Dispense Refill  . ASPIRIN 325 MG PO TABS Oral Take 325 mg by mouth daily.      Marland Kitchen DIMENHYDRINATE 50 MG PO TABS Oral Take 75 mg by mouth at bedtime as needed. For sleep     . HYDROCODONE-ACETAMINOPHEN 5-325 MG PO TABS Oral Take 2 tablets by mouth every 4 (four) hours as needed for pain. 6 tablet 0  . METOPROLOL TARTRATE 25 MG PO TABS Oral Take 25 mg by mouth 2 (two) times daily.     . ADULT MULTIVITAMIN W/MINERALS CH Oral Take 1 tablet by mouth daily.      Marland Kitchen PRAVASTATIN SODIUM 20 MG PO TABS Oral Take 20 mg by mouth daily.      Marland Kitchen SPIRONOLACTONE 25 MG PO TABS  TAKE 1 TABLET BY MOUTH EVERY DAY 30 tablet 11    BP 157/83  Pulse 72  Temp(Src) 98 F (36.7 C) (Oral)  Resp 16  SpO2 96%  Physical Exam  Constitutional: He is oriented to person, place, and time. He appears well-developed and well-nourished. No distress.  HENT:  Head: Normocephalic and  atraumatic.  Mouth/Throat: No oropharyngeal exudate.  Eyes: EOM are normal. Pupils are equal, round, and reactive to light.  Neck: Normal range of motion. Neck supple.  Cardiovascular: Normal rate and regular rhythm.  Exam reveals no friction rub.   No murmur heard. Pulmonary/Chest: Effort normal and breath sounds normal. No respiratory distress. He has no wheezes. He has no rales.  Abdominal: He exhibits no distension. There is no tenderness. There is no rebound.  Musculoskeletal: Normal range of motion. He exhibits no edema.  Neurological: He is alert and oriented to person, place, and time.  Skin: He is not diaphoretic.       Patient's legs are shiny and hairless, which is chronic he states secondary to  peripheral vascular disease    ED Course  Procedures (including critical care time)  Labs Reviewed - No data to display No results found.   1. Muscle strain     *PRELIMINARY RESULTS*  Left lower extremity venous duplex completed. No evidence of DVT, superficial thrombosis, or Baker's cyst.  Slaughter, [place] D  06/17/2011, 3:10 PM   MDM  Patient is 65 years old presenting with leg pain. History of DVT. Not currently on anticoagulation. Had recent workup for PE following a stay for pneumonia. The patient had no PE was seen earlier this week. Left leg pain located behind his knee. No lower extremity swelling. Patient denies fevers, nausea, vomiting, abdominal pain, shortness of breath. Afebrile vital signs are stable. No trauma appreciated her left leg. No lower left leg swelling. No Baker's cyst appreciated on the left knee. Concern for possible DVT with history of similar. Will obtain lower sugar Dopplers of the left leg. Report per the technician is negative for DVT.  Patient sent home with a diagnosis of muscle strain stable for discharge. Patient instructed to follow up with PCP in 2-3 days    I saw and evaluated the patient, reviewed the resident's note and I agree with the findings and plan. Pt c/o left upper calf/post knee soreness. No swelling noted. No skin changes or erythema. Distal pulses palp. Vascular dopplers neg.  Elwin Mocha, MD 06/17/11 1516  Suzi Roots, MD 06/19/11 [phone]

## 2011-06-17 NOTE — Telephone Encounter (Signed)
Pt called and states he went to the ED on 06/14/11 for back pain and possible embolism.  Pt states he did not have an embolism but now his left leg is swollen and tender.  Pt states he has had DVT's before and he feels as though he he may have one in his left leg.  Pt advised to go the ED.

## 2011-06-23 ENCOUNTER — Other Ambulatory Visit (INDEPENDENT_AMBULATORY_CARE_PROVIDER_SITE_OTHER): Payer: 59 | Admitting: *Deleted

## 2011-06-23 ENCOUNTER — Encounter: Payer: Self-pay | Admitting: Cardiology

## 2011-06-23 ENCOUNTER — Ambulatory Visit (INDEPENDENT_AMBULATORY_CARE_PROVIDER_SITE_OTHER): Payer: 59 | Admitting: Cardiology

## 2011-06-23 VITALS — BP 148/96 | HR 80 | Ht 74.0 in | Wt 214.0 lb

## 2011-06-23 DIAGNOSIS — IMO0002 Reserved for concepts with insufficient information to code with codable children: Secondary | ICD-10-CM

## 2011-06-23 DIAGNOSIS — I82409 Acute embolism and thrombosis of unspecified deep veins of unspecified lower extremity: Secondary | ICD-10-CM

## 2011-06-23 DIAGNOSIS — E7800 Pure hypercholesterolemia, unspecified: Secondary | ICD-10-CM

## 2011-06-23 DIAGNOSIS — I119 Hypertensive heart disease without heart failure: Secondary | ICD-10-CM

## 2011-06-23 DIAGNOSIS — I10 Essential (primary) hypertension: Secondary | ICD-10-CM

## 2011-06-23 LAB — BASIC METABOLIC PANEL
CO2: 30 mEq/L (ref 19–32)
Calcium: 9.3 mg/dL (ref 8.4–10.5)
Chloride: 101 mEq/L (ref 96–112)
Glucose, Bld: 84 mg/dL (ref 70–99)
Potassium: 4.3 mEq/L (ref 3.5–5.1)
Sodium: 137 mEq/L (ref 135–145)

## 2011-06-23 LAB — LIPID PANEL: Total CHOL/HDL Ratio: 4

## 2011-06-23 LAB — HEPATIC FUNCTION PANEL
ALT: 31 U/L (ref 0–53)
AST: 39 U/L — ABNORMAL HIGH (ref 0–37)
Albumin: 4.6 g/dL (ref 3.5–5.2)
Alkaline Phosphatase: 67 U/L (ref 39–117)
Total Protein: 8.4 g/dL — ABNORMAL HIGH (ref 6.0–8.3)

## 2011-06-23 MED ORDER — OSELTAMIVIR PHOSPHATE 75 MG PO CAPS: 75.0000 mg | ORAL_CAPSULE | Freq: Every day | ORAL | Status: AC

## 2011-06-23 NOTE — Assessment & Plan Note (Signed)
The patient has a history of hypertension.  He is on spironolactone and Lopressor.  He checks his blood pressure at home and normally is in the 120/80 range.  Today his pressure is a little high.  He has not been able to exercise much recently because of his other intercurrent illnesses in his diet has not been instructed in regard to sodium over the holidays.  We will not increase his medicine at this point but I emphasized low-salt diet and weight loss and exercise.  He is under more stress because his wife is home with the flu and in fact we are sending Mr. Herder some Tamiflu to use prophylactically 75 mg daily for 10 days

## 2011-06-23 NOTE — Assessment & Plan Note (Signed)
The patient is on pravastatin for his hypercholesterolemia.  He is not having any myalgias from the pravastatin.  Blood work drawn today is pending

## 2011-06-23 NOTE — Patient Instructions (Signed)
Increase your exercise and work on diet and weight loss  Your physician wants you to follow-up in: 4 month You will receive a reminder letter in the mail two months in advance. If you don't receive a letter, please call our office to schedule the follow-up appointment.

## 2011-06-23 NOTE — Assessment & Plan Note (Signed)
No signs or symptoms of recurrent deep vein thrombosis and he did have a recent Doppler study of his lower extremity on the left which was negative for DVT

## 2011-06-23 NOTE — Progress Notes (Signed)
Patrick Sosa Date of Birth:  July 19, 1945 [facility] [address]   [address] [place], [place]  [address] [phone]           Fax   [phone]  History of Present Illness: This pleasant 66 year old gentleman is seen for a scheduled four-month followup office visit.  He has a past history of essential hypertension and a remote history of deep vein thrombosis he has a history of high cholesterol.  Since last visit he was doing well until early November when he developed signs and symptoms of pneumonia.  He went to urgent care and was treated as an outpatient with antibiotics.  It took him several weeks to get over it.  Then on Christmas Eve he went to [facility] emergency room with back pain hearing it might be pleurisy and his workup was negative.  3 days later he went back to [facility] for pain behind his left leg hearing it might be phlebitis and he had Dopplers which were negative and he was sent home again.  Since then he has felt better.  Still has a minimal residual cough.  No chest pain.  No further flank pain or pleuritic pain and his leg pain has resolved without specific therapy.  Current Outpatient Prescriptions  Medication Sig Dispense Refill  . aspirin 325 MG tablet Take 325 mg by mouth daily.        Marland Kitchen dimenhyDRINATE (DRAMAMINE) 50 MG tablet Take 75 mg by mouth at bedtime as needed. For sleep       . metoprolol tartrate (LOPRESSOR) 25 MG tablet Take 25 mg by mouth 2 (two) times daily.       . Multiple Vitamin (MULITIVITAMIN WITH MINERALS) TABS Take 1 tablet by mouth daily.        . pravastatin (PRAVACHOL) 20 MG tablet Take 20 mg by mouth daily.        Marland Kitchen spironolactone (ALDACTONE) 25 MG tablet TAKE 1 TABLET BY MOUTH EVERY DAY  30 tablet  11  . oseltamivir (TAMIFLU) 75 MG capsule Take 1 capsule (75 mg total) by mouth daily.  10 capsule  0    No Known Allergies  Patient Active Problem List  Diagnoses  . FACTOR V DEFICIENCY  . HYPERTENSION  . DVT  .  ONYCHOMYCOSIS  . Hypertension  . Polycythemia  . DVT (deep venous thrombosis)  . Hypercholesterolemia  . Dry cough    History  Smoking status  . Former Smoker  . Quit date: 10/12/1987  Smokeless tobacco  . Never Used    History  Alcohol Use  . Yes    rarely    Family History  Problem Relation Age of Onset  . Alzheimer's disease Mother   . Pancreatic cancer Father   . Hypertension Father   . Heart attack Father   . Cancer Father   . Hypertension Sister     Review of Systems: Constitutional: no fever chills diaphoresis or fatigue or change in weight.  Head and neck: no hearing loss, no epistaxis, no photophobia or visual disturbance. Respiratory: No cough, shortness of breath or wheezing. Cardiovascular: No chest pain peripheral edema, palpitations. Gastrointestinal: No abdominal distention, no abdominal pain, no change in bowel habits hematochezia or melena. Genitourinary: No dysuria, no frequency, no urgency, no nocturia. Musculoskeletal:No arthralgias, no back pain, no gait disturbance or myalgias. Neurological: No dizziness, no headaches, no numbness, no seizures, no syncope, no weakness, no tremors. Hematologic: No lymphadenopathy, no easy bruising. Psychiatric: No confusion,  no hallucinations, no sleep disturbance.    Physical Exam: Filed Vitals:   06/23/11 1348  BP: 148/96  Pulse: 80   The general appearance reveals a well-developed well-nourished gentleman in no distress.The head and neck exam reveals pupils equal and reactive.  Extraocular movements are full.  There is no scleral icterus.  The mouth and pharynx are normal.  The neck is supple.  The carotids reveal no bruits.  The jugular venous pressure is normal.  The  thyroid is not enlarged.  There is no lymphadenopathy.  The chest is clear to percussion and auscultation.  There are no rales or rhonchi.  Expansion of the chest is symmetrical.  The precordium is quiet.  The first heart sound is normal.   The second heart sound is physiologically split.  There is no murmur gallop rub or click.  There is no abnormal lift or heave.  The abdomen is soft and nontender.  The bowel sounds are normal.  The liver and spleen are not enlarged.  There are no abdominal masses.  There are no abdominal bruits.  Extremities reveal good pedal pulses.  There is no phlebitis or edema.  There is no cyanosis or clubbing.  Strength is normal and symmetrical in all extremities.  There is no lateralizing weakness.  There are no sensory deficits.  The skin is warm and dry.  There is no rash.    Assessment / Plan: Continue same medication.  Tamiflu for prophylaxis.  Work harder on diet and exercise and weight loss.  Recheck in 4 months for a followup office visit CBC and fasting lab work.  Patient does have a history of polycythemia and has not been to donate blood recently because of his intercurrent illnesses.

## 2011-06-24 ENCOUNTER — Telehealth: Payer: Self-pay | Admitting: *Deleted

## 2011-06-24 ENCOUNTER — Other Ambulatory Visit: Payer: Self-pay | Admitting: Cardiology

## 2011-06-24 NOTE — Telephone Encounter (Signed)
Message copied by [name] on Thu Jun 24, 2011  2:12 PM ------      Message from: Cassell Clement      Created: Wed Jun 23, 2011  8:45 PM       Please report.  The labs are stable.  Continue same meds.  Continue careful diet.

## 2011-06-24 NOTE — Telephone Encounter (Signed)
Advised of labs 

## 2011-06-29 ENCOUNTER — Ambulatory Visit (INDEPENDENT_AMBULATORY_CARE_PROVIDER_SITE_OTHER): Payer: 59 | Admitting: Family

## 2011-06-29 ENCOUNTER — Encounter: Payer: Self-pay | Admitting: Family

## 2011-06-29 DIAGNOSIS — J069 Acute upper respiratory infection, unspecified: Secondary | ICD-10-CM

## 2011-06-29 DIAGNOSIS — R05 Cough: Secondary | ICD-10-CM

## 2011-06-29 DIAGNOSIS — R059 Cough, unspecified: Secondary | ICD-10-CM

## 2011-06-29 MED ORDER — BENZONATATE 100 MG PO CAPS: 200.0000 mg | ORAL_CAPSULE | Freq: Four times a day (QID) | ORAL | Status: DC | PRN

## 2011-06-29 NOTE — Patient Instructions (Addendum)
1. Claritin, Zyrtec, or Allegra OTC. Upper Respiratory Infection, Adult An upper respiratory infection (URI) is also sometimes known as the common cold. The upper respiratory tract includes the nose, sinuses, throat, trachea, and bronchi. Bronchi are the airways leading to the lungs. Most people improve within 1 week, but symptoms can last up to 2 weeks. A residual cough may last even longer.  CAUSES Many different viruses can infect the tissues lining the upper respiratory tract. The tissues become irritated and inflamed and often become very moist. Mucus production is also common. A cold is contagious. You can easily spread the virus to others by oral contact. This includes kissing, sharing a glass, coughing, or sneezing. Touching your mouth or nose and then touching a surface, which is then touched by another person, can also spread the virus. SYMPTOMS  Symptoms typically develop 1 to 3 days after you come in contact with a cold virus. Symptoms vary from person to person. They may include:  Runny nose.   Sneezing.   Nasal congestion.   Sinus irritation.   Sore throat.   Loss of voice (laryngitis).   Cough.   Fatigue.   Muscle aches.   Loss of appetite.   Headache.   Low-grade fever.  DIAGNOSIS  You might diagnose your own cold based on familiar symptoms, since most people get a cold 2 to 3 times a year. Your caregiver can confirm this based on your exam. Most importantly, your caregiver can check that your symptoms are not due to another disease such as strep throat, sinusitis, pneumonia, asthma, or epiglottitis. Blood tests, throat tests, and X-rays are not necessary to diagnose a common cold, but they may sometimes be helpful in excluding other more serious diseases. Your caregiver will decide if any further tests are required. RISKS AND COMPLICATIONS  You may be at risk for a more severe case of the common cold if you smoke cigarettes, have chronic heart disease (such as heart  failure) or lung disease (such as asthma), or if you have a weakened immune system. The very young and very old are also at risk for more serious infections. Bacterial sinusitis, middle ear infections, and bacterial pneumonia can complicate the common cold. The common cold can worsen asthma and chronic obstructive pulmonary disease (COPD). Sometimes, these complications can require emergency medical care and may be life-threatening. PREVENTION  The best way to protect against getting a cold is to practice good hygiene. Avoid oral or hand contact with people with cold symptoms. Wash your hands often if contact occurs. There is no clear evidence that vitamin C, vitamin E, echinacea, or exercise reduces the chance of developing a cold. However, it is always recommended to get plenty of rest and practice good nutrition. TREATMENT  Treatment is directed at relieving symptoms. There is no cure. Antibiotics are not effective, because the infection is caused by a virus, not by bacteria. Treatment may include:  Increased fluid intake. Sports drinks offer valuable electrolytes, sugars, and fluids.   Breathing heated mist or steam (vaporizer or shower).   Eating chicken soup or other clear broths, and maintaining good nutrition.   Getting plenty of rest.   Using gargles or lozenges for comfort.   Controlling fevers with ibuprofen or acetaminophen as directed by your caregiver.   Increasing usage of your inhaler if you have asthma.  Zinc gel and zinc lozenges, taken in the first 24 hours of the common cold, can shorten the duration and lessen the severity of symptoms. Pain  medicines may help with fever, muscle aches, and throat pain. A variety of non-prescription medicines are available to treat congestion and runny nose. Your caregiver can make recommendations and may suggest nasal or lung inhalers for other symptoms.  HOME CARE INSTRUCTIONS   Only take over-the-counter or prescription medicines for pain,  discomfort, or fever as directed by your caregiver.   Use a warm mist humidifier or inhale steam from a shower to increase air moisture. This may keep secretions moist and make it easier to breathe.   Drink enough water and fluids to keep your urine clear or pale yellow.   Rest as needed.   Return to work when your temperature has returned to normal or as your caregiver advises. You may need to stay home longer to avoid infecting others. You can also use a face mask and careful hand washing to prevent spread of the virus.  SEEK MEDICAL CARE IF:   After the first few days, you feel you are getting worse rather than better.   You need your caregiver's advice about medicines to control symptoms.   You develop chills, worsening shortness of breath, or brown or red sputum. These may be signs of pneumonia.   You develop yellow or brown nasal discharge or pain in the face, especially when you bend forward. These may be signs of sinusitis.   You develop a fever, swollen neck glands, pain with swallowing, or white areas in the back of your throat. These may be signs of strep throat.  SEEK IMMEDIATE MEDICAL CARE IF:   You have a fever.   You develop severe or persistent headache, ear pain, sinus pain, or chest pain.   You develop wheezing, a prolonged cough, cough up blood, or have a change in your usual mucus (if you have chronic lung disease).   You develop sore muscles or a stiff neck.  Document Released: 12/01/2000 Document Revised: 02/17/2011 Document Reviewed: 10/09/2010 [facility] Patient Information 2012 [place], [place].   Barotitis Media Barotitis media is soreness (inflammation) of the area behind the eardrum (middle ear). This occurs when the auditory tube (Eustachian tube) leading from the back of the throat to the eardrum is blocked. When it is blocked air cannot move in and out of the middle ear to equalize pressure changes. These pressure changes come from changes in altitude  when:  Flying.   Driving in the mountains.   Diving.  Problems are more likely to occur with pressure changes during times when you are congested as from:  Hay fever.   Upper respiratory infection.   A cold.  Damage or hearing loss (barotrauma) caused by this may be permanent. HOME CARE INSTRUCTIONS   Use medicines as recommended by your caregiver. Over the counter medicines will help unblock the canal and can help during times of air travel.   Do not put anything into your ears to clean or unplug them. Eardrops will not be helpful.   Do not swim, dive, or fly until your caregiver says it is all right to do so. If these activities are necessary, chewing gum with frequent swallowing may help. It is also helpful to hold your nose and gently blow to pop your ears for equalizing pressure changes. This forces air into the Eustachian tube.   For little ones with problems, give your baby a bottle of water or juice during periods when pressure changes would be anticipated such as during take offs and landings associated with air travel.   Only take  over-the-counter or prescription medicines for pain, discomfort, or fever as directed by your caregiver.   A decongestant may be helpful in de-congesting the middle ear and make pressure equalization easier. This can be even more effective if the drops (spray) are delivered with the head lying over the edge of a bed with the head tilted toward the ear on the affected side.   If your caregiver has given you a follow-up appointment, it is very important to keep that appointment. Not keeping the appointment could result in a chronic or permanent injury, pain, hearing loss and disability. If there is any problem keeping the appointment, you must call back to this facility for assistance.  SEEK IMMEDIATE MEDICAL CARE IF:   You develop a severe headache, dizziness, severe ear pain, or bloody or pus-like drainage from your ears.   An oral temperature  above 102 F (38.9 C) develops.   Your problems do not improve or become worse.  MAKE SURE YOU:   Understand these instructions.   Will watch your condition.   Will get help right away if you are not doing well or get worse.  Document Released: 06/04/2000 Document Revised: 02/17/2011 Document Reviewed: 01/11/2008 [facility] Patient Information 2012 [place], [place].

## 2011-06-29 NOTE — Progress Notes (Signed)
Subjective:    Patient ID: Patrick Sosa, male    DOB: May 17, 1946, 66 y.o.   MRN: [id number]  HPI 66 year old white male, nonsmoker, isn't getting after recovering from the flu last week. He continues to have a cough and has left ear pain that's been going on for one day. He has taken over-the-counter Robitussin that has helped his symptoms.He denies any nasal or chest congestion, no shortness of breath, chest pain or edema.   Review of Systems  Constitutional: Negative.   HENT: Positive for ear pain. Negative for congestion, rhinorrhea, postnasal drip and sinus pressure.        Left ear pain  Eyes: Negative.   Respiratory: Positive for cough.   Cardiovascular: Negative.   Musculoskeletal: Negative.   Skin: Negative.   Neurological: Negative.   Hematological: Negative.   Psychiatric/Behavioral: Negative.        Past Medical History  Diagnosis Date  . Hypertension   . Polycythemia   . DVT (deep venous thrombosis)   . Hypercholesterolemia   . Dry cough   . DVT (deep venous thrombosis)     History   Social History  . Marital Status: Married    Spouse Name: N/A    Number of Children: N/A  . Years of Education: N/A   Occupational History  . Not on file.   Social History Main Topics  . Smoking status: Former Smoker    Quit date: 10/12/1987  . Smokeless tobacco: Never Used  . Alcohol Use: Yes     rarely  . Drug Use: No  . Sexually Active: Not on file   Other Topics Concern  . Not on file   Social History Narrative  . No narrative on file    Past Surgical History  Procedure Date  . Tonsillectomy     Family History  Problem Relation Age of Onset  . Alzheimer's disease Mother   . Pancreatic cancer Father   . Hypertension Father   . Heart attack Father   . Cancer Father   . Hypertension Sister     No Known Allergies  Current Outpatient Prescriptions on File Prior to Visit  Medication Sig Dispense Refill  . aspirin 325 MG tablet Take 325 mg by mouth  daily.        Marland Kitchen dimenhyDRINATE (DRAMAMINE) 50 MG tablet Take 75 mg by mouth at bedtime as needed. For sleep       . metoprolol tartrate (LOPRESSOR) 25 MG tablet TAKE 1 TABLET TWICE DAILY  60 tablet  6  . Multiple Vitamin (MULITIVITAMIN WITH MINERALS) TABS Take 1 tablet by mouth daily.        Marland Kitchen oseltamivir (TAMIFLU) 75 MG capsule Take 1 capsule (75 mg total) by mouth daily.  10 capsule  0  . pravastatin (PRAVACHOL) 20 MG tablet TAKE 1 TABLET BY MOUTH DAILY  30 tablet  6  . spironolactone (ALDACTONE) 25 MG tablet TAKE 1 TABLET BY MOUTH EVERY DAY  30 tablet  11    BP 132/70  Temp(Src) 98.6 F (37 C) (Oral)  Wt 218 lb (98.884 kg)chart Objective:   Physical Exam  Constitutional: He is oriented to person, place, and time. He appears well-developed and well-nourished.  HENT:  Right Ear: External ear normal.  Left Ear: External ear normal.  Nose: Nose normal.  Mouth/Throat: Oropharynx is clear and moist.  Neck: Normal range of motion. Neck supple.  Cardiovascular: Normal rate, regular rhythm and normal heart sounds.   Pulmonary/Chest: Effort normal and breath  sounds normal.  Musculoskeletal: Normal range of motion.  Neurological: He is alert and oriented to person, place, and time.  Skin: Skin is warm.  Psychiatric: He has a normal mood and affect.          Assessment & Plan:  Assessment: Upper respiratory infection, cough  Plan: Encouraged an over-the-counter antihistamine like Claritin, Zyrtec, or Allegra. Tessalon Perles 200 mg 1 capsule by mouth 3 times a day when necessary cough. Patient to call if symptoms worsen or persist, recheck as scheduled and when necessary.

## 2011-09-28 ENCOUNTER — Ambulatory Visit (INDEPENDENT_AMBULATORY_CARE_PROVIDER_SITE_OTHER): Payer: 59 | Admitting: Internal Medicine

## 2011-09-28 ENCOUNTER — Telehealth: Payer: Self-pay | Admitting: *Deleted

## 2011-09-28 ENCOUNTER — Ambulatory Visit (HOSPITAL_COMMUNITY)
Admission: RE | Admit: 2011-09-28 | Discharge: 2011-09-28 | Disposition: A | Discharge status: Home / Self Care | Payer: 59 | Source: Ambulatory Visit | Attending: Internal Medicine | Admitting: Internal Medicine

## 2011-09-28 ENCOUNTER — Encounter: Payer: Self-pay | Admitting: Internal Medicine

## 2011-09-28 VITALS — BP 126/80 | Temp 98.0°F | Wt 218.0 lb

## 2011-09-28 DIAGNOSIS — D682 Hereditary deficiency of other clotting factors: Secondary | ICD-10-CM

## 2011-09-28 DIAGNOSIS — I10 Essential (primary) hypertension: Secondary | ICD-10-CM

## 2011-09-28 DIAGNOSIS — R079 Chest pain, unspecified: Secondary | ICD-10-CM

## 2011-09-28 DIAGNOSIS — J90 Pleural effusion, not elsewhere classified: Secondary | ICD-10-CM | POA: Insufficient documentation

## 2011-09-28 NOTE — Progress Notes (Signed)
Quick Note:  Spoke with pt- informed if results ______

## 2011-09-28 NOTE — Telephone Encounter (Signed)
rerquesting cxr results from today [phone]

## 2011-09-28 NOTE — Patient Instructions (Signed)
Chest x-ray as discussed  Call or return to clinic prn if these symptoms worsen or fail to improve as anticipated.  

## 2011-09-28 NOTE — Progress Notes (Signed)
  Subjective:    Patient ID: Patrick Sosa, male    DOB: [birth year], 66 y.o.   MRN: [id number]  HPI  66 -year-old patient who has a history of factor V Leiden mutation and history of prior DVT. He presented with a chief complaint today of right-sided back pain of 2 months duration. However on further questioning he had similar pain prior to Christmas and was evaluated the in the ED to rule out pulmonary embolism. He was treated for pneumonia apparently in November. Evaluation in December revealed a moderate right-sided pleural effusion. The patient continues to have pain in the right flank area that is aggravated by deep inspiration denies any cough;  remote tobacco use. Denies any shortness of breath    Review of Systems  Constitutional: Negative for fever, chills, appetite change and fatigue.  HENT: Negative for hearing loss, ear pain, congestion, sore throat, trouble swallowing, neck stiffness, dental problem, voice change and tinnitus.   Eyes: Negative for pain, discharge and visual disturbance.  Respiratory: Negative for cough, chest tightness, wheezing and stridor.   Cardiovascular: Negative for chest pain, palpitations and leg swelling.  Gastrointestinal: Negative for nausea, vomiting, abdominal pain, diarrhea, constipation, blood in stool and abdominal distention.  Genitourinary: Positive for flank pain. Negative for urgency, hematuria, discharge, difficulty urinating and genital sores.  Musculoskeletal: Negative for myalgias, back pain, joint swelling, arthralgias and gait problem.  Skin: Negative for rash.  Neurological: Negative for dizziness, syncope, speech difficulty, weakness, numbness and headaches.  Hematological: Negative for adenopathy. Does not bruise/bleed easily.  Psychiatric/Behavioral: Negative for behavioral problems and dysphoric mood. The patient is not nervous/anxious.        Objective:   Physical Exam  Constitutional: He is oriented to person, place, and time. He  appears well-developed and well-nourished. No distress.  HENT:  Head: Normocephalic.  Right Ear: External ear normal.  Left Ear: External ear normal.  Eyes: Conjunctivae and EOM are normal.  Neck: Normal range of motion.  Cardiovascular: Normal rate and normal heart sounds.   Pulmonary/Chest: Effort normal and breath sounds normal.       Dullness and diminished breath sounds at the right base Oxygen saturation normal  Abdominal: Bowel sounds are normal.  Musculoskeletal: Normal range of motion. He exhibits no edema and no tenderness.  Neurological: He is alert and oriented to person, place, and time.  Psychiatric: He has a normal mood and affect. His behavior is normal.          Assessment & Plan:   Right flank pain. Suspect this is pulmonary etiology. We'll check a chest x-ray with a decubitus view. If pleural fluid is still present will consider pulmonary referral or possible diagnostic thoracentesis hypertension stable

## 2011-10-15 ENCOUNTER — Telehealth: Payer: Self-pay | Admitting: *Deleted

## 2011-10-15 ENCOUNTER — Other Ambulatory Visit: Payer: Self-pay | Admitting: Internal Medicine

## 2011-10-15 DIAGNOSIS — G8929 Other chronic pain: Secondary | ICD-10-CM

## 2011-10-15 NOTE — Telephone Encounter (Signed)
This is a Dr. Cato Mulligan pt you have seen multiple times

## 2011-10-15 NOTE — Telephone Encounter (Signed)
Please advise 

## 2011-10-15 NOTE — Telephone Encounter (Signed)
Spoke with pt - informed of Dr. Vernon Prey instructions for MRI - will do order - terri will call once authed and schedule. But it wont be until next week - order done

## 2011-10-15 NOTE — Telephone Encounter (Signed)
Please schedule lumbar MRI to evaluate

## 2011-10-15 NOTE — Telephone Encounter (Signed)
Pt is calling back to let Dr Kirtland Bouchard know that he is still having severe back pain, and needs further guidance re: this.  Specialist?  More xray?  Needs to know something ASAP as he is having a lot of pain.  No fever, SOB.

## 2011-10-18 ENCOUNTER — Other Ambulatory Visit (INDEPENDENT_AMBULATORY_CARE_PROVIDER_SITE_OTHER): Payer: 59

## 2011-10-18 DIAGNOSIS — I119 Hypertensive heart disease without heart failure: Secondary | ICD-10-CM

## 2011-10-18 DIAGNOSIS — E7800 Pure hypercholesterolemia, unspecified: Secondary | ICD-10-CM

## 2011-10-18 LAB — CBC WITH DIFFERENTIAL/PLATELET
Basophils Relative: 0.7 % (ref 0.0–3.0)
Eosinophils Relative: 4.9 % (ref 0.0–5.0)
HCT: 46.2 % (ref 39.0–52.0)
MCV: 102.5 fl — ABNORMAL HIGH (ref 78.0–100.0)
Monocytes Absolute: 0.6 10*3/uL (ref 0.1–1.0)
Monocytes Relative: 9.2 % (ref 3.0–12.0)
Neutrophils Relative %: 59.9 % (ref 43.0–77.0)
Platelets: 180 10*3/uL (ref 150.0–400.0)
RBC: 4.51 Mil/uL (ref 4.22–5.81)
WBC: 6.3 10*3/uL (ref 4.5–10.5)

## 2011-10-18 LAB — BASIC METABOLIC PANEL
Chloride: 104 mEq/L (ref 96–112)
Creatinine, Ser: 1.3 mg/dL (ref 0.4–1.5)
GFR: 58.66 mL/min — ABNORMAL LOW (ref >60.00)
Potassium: 4.8 mEq/L (ref 3.5–5.1)

## 2011-10-18 LAB — LIPID PANEL
Cholesterol: 123 mg/dL (ref 0–200)
LDL Cholesterol: 57 mg/dL (ref 0–99)
Triglycerides: 158 mg/dL — ABNORMAL HIGH (ref 0.0–149.0)
VLDL: 31.6 mg/dL (ref 0.0–40.0)

## 2011-10-18 LAB — HEPATIC FUNCTION PANEL
ALT: 30 U/L (ref 0–53)
Albumin: 4.2 g/dL (ref 3.5–5.2)
Total Protein: 8 g/dL (ref 6.0–8.3)

## 2011-10-18 NOTE — Progress Notes (Signed)
Quick Note:  Please make copy of labs for patient visit. ______ 

## 2011-10-20 ENCOUNTER — Ambulatory Visit
Admission: RE | Admit: 2011-10-20 | Discharge: 2011-10-20 | Disposition: A | Discharge status: Home / Self Care | Payer: 59 | Source: Ambulatory Visit | Attending: Internal Medicine | Admitting: Internal Medicine

## 2011-10-20 DIAGNOSIS — M545 Low back pain: Secondary | ICD-10-CM

## 2011-10-20 DIAGNOSIS — G8929 Other chronic pain: Secondary | ICD-10-CM

## 2011-10-25 ENCOUNTER — Encounter: Payer: Self-pay | Admitting: Cardiology

## 2011-10-25 ENCOUNTER — Ambulatory Visit (INDEPENDENT_AMBULATORY_CARE_PROVIDER_SITE_OTHER): Payer: 59 | Admitting: Cardiology

## 2011-10-25 VITALS — BP 140/90 | HR 60 | Ht 74.0 in | Wt 213.0 lb

## 2011-10-25 DIAGNOSIS — E7800 Pure hypercholesterolemia, unspecified: Secondary | ICD-10-CM

## 2011-10-25 DIAGNOSIS — I82409 Acute embolism and thrombosis of unspecified deep veins of unspecified lower extremity: Secondary | ICD-10-CM

## 2011-10-25 DIAGNOSIS — D751 Secondary polycythemia: Secondary | ICD-10-CM

## 2011-10-25 DIAGNOSIS — D45 Polycythemia vera: Secondary | ICD-10-CM

## 2011-10-25 DIAGNOSIS — I10 Essential (primary) hypertension: Secondary | ICD-10-CM

## 2011-10-25 DIAGNOSIS — I119 Hypertensive heart disease without heart failure: Secondary | ICD-10-CM

## 2011-10-25 MED ORDER — AMLODIPINE BESYLATE 5 MG PO TABS: 5.0000 mg | ORAL_TABLET | Freq: Every day | ORAL | Status: DC

## 2011-10-25 NOTE — Assessment & Plan Note (Signed)
Patient has a history of high blood pressure.  He has been checking his blood pressure at home and it is often in the range of 160 systolic.  Today here it was 140 systolic rechecked by myself.  He will continue same medication and add amlodipine 5 mg one daily for additional blood pressure lowering effect.

## 2011-10-25 NOTE — Patient Instructions (Signed)
Start Amlodipine 5 mg daily, Rx sent to [facility]  Your physician recommends that you schedule a follow-up appointment in: 4 months with fasting labs (lp/bmet/hfp/cbc) and EKG

## 2011-10-25 NOTE — Assessment & Plan Note (Signed)
The patient has a past history of DVT which occurred after a long auto trips.  He has not had any recent symptoms of phlebitis.  He is taking a full strength aspirin 325 mg daily.

## 2011-10-25 NOTE — Progress Notes (Signed)
Patrick Sosa Date of Birth:  February 24, 1946 [facility] [address] [place], [place]  [address] [phone]  Fax   [phone]  HPI: This pleasant 66 year old gentleman is seen for a scheduled followup office visit he has a history of essential hypertension and a history of previous deep vein thrombosis.  He has a history of high cholesterol.  He has a history of polycythemia.  Since last visit he has been doing reasonably well except for some residual back pain which began when he had a pneumonia like episode at the end of 2012.  Because of the persistence of his back pain he had a recent MRI of his spine which was negative.  The patient was advised to try Aleve 200 mg twice a day for a week which he did and he reports that the discomfort has improved significantly.  Current Outpatient Prescriptions  Medication Sig Dispense Refill  . aspirin 325 MG tablet Take 325 mg by mouth daily.        Marland Kitchen dimenhyDRINATE (DRAMAMINE) 50 MG tablet Take 75 mg by mouth at bedtime as needed. For sleep       . metoprolol tartrate (LOPRESSOR) 25 MG tablet TAKE 1 TABLET TWICE DAILY  60 tablet  6  . Multiple Vitamin (MULITIVITAMIN WITH MINERALS) TABS Take 1 tablet by mouth daily.        . pravastatin (PRAVACHOL) 20 MG tablet TAKE 1 TABLET BY MOUTH DAILY  30 tablet  6  . spironolactone (ALDACTONE) 25 MG tablet TAKE 1 TABLET BY MOUTH EVERY DAY  30 tablet  11  . amLODipine (NORVASC) 5 MG tablet Take 1 tablet (5 mg total) by mouth daily.  30 tablet  11    No Known Allergies  Patient Active Problem List  Diagnoses  . FACTOR V DEFICIENCY  . HYPERTENSION  . DVT  . ONYCHOMYCOSIS  . Hypertension  . Polycythemia  . DVT (deep venous thrombosis)  . Hypercholesterolemia  . Dry cough    History  Smoking status  . Former Smoker  . Quit date: 10/12/1987  Smokeless tobacco  . Never Used    History  Alcohol Use  . Yes    rarely    Family History  Problem Relation Age of Onset  .  Alzheimer's disease Mother   . Pancreatic cancer Father   . Hypertension Father   . Heart attack Father   . Cancer Father   . Hypertension Sister     Review of Systems: The patient denies any heat or cold intolerance.  No weight gain or weight loss.  The patient denies headaches or blurry vision.  There is no cough or sputum production.  The patient denies dizziness.  There is no hematuria or hematochezia.  The patient denies any muscle aches or arthritis.  The patient denies any rash.  The patient denies frequent falling or instability.  There is no history of depression or anxiety.  All other systems were reviewed and are negative.   Physical Exam: Filed Vitals:   10/25/11 0955  BP: 140/90  Pulse: 60   the general appearance reveals a well-developed well-nourished gentleman in no distress.The head and neck exam reveals pupils equal and reactive.  Extraocular movements are full.  There is no scleral icterus.  The mouth and pharynx are normal.  The neck is supple.  The carotids reveal no bruits.  The jugular venous pressure is normal.  The  thyroid is not enlarged.  There is no lymphadenopathy.  The  chest is clear to percussion and auscultation.  There are no rales or rhonchi.  Expansion of the chest is symmetrical.  The precordium is quiet.  The first heart sound is normal.  The second heart sound is physiologically split.  There is no murmur gallop rub or click.  There is no abnormal lift or heave.  The abdomen is soft and nontender.  The bowel sounds are normal.  The liver and spleen are not enlarged.  There are no abdominal masses.  There are no abdominal bruits.  Extremities reveal good pedal pulses.  There is no phlebitis or edema.  There is no cyanosis or clubbing.  Strength is normal and symmetrical in all extremities.  There is no lateralizing weakness.  There are no sensory deficits.  The skin is warm and dry.  There is no rash.     Assessment / Plan: Continue same medication and a  hep amlodipine 5 mg one daily.  Recheck in 4 months for followup office visit EKG lipid panel hepatic function panel and basal metabolic panel and CBC

## 2011-10-25 NOTE — Assessment & Plan Note (Signed)
The patient has a history of polycythemia and donates blood on a regular basis.  His CBC today is satisfactory

## 2011-12-31 ENCOUNTER — Other Ambulatory Visit: Payer: Self-pay | Admitting: Cardiology

## 2012-01-28 ENCOUNTER — Other Ambulatory Visit: Payer: Self-pay | Admitting: Cardiology

## 2012-01-31 NOTE — Telephone Encounter (Signed)
Refilled metoprolol 

## 2012-02-23 ENCOUNTER — Ambulatory Visit (INDEPENDENT_AMBULATORY_CARE_PROVIDER_SITE_OTHER): Payer: 59 | Admitting: Cardiology

## 2012-02-23 ENCOUNTER — Encounter: Payer: Self-pay | Admitting: Cardiology

## 2012-02-23 VITALS — BP 138/80 | HR 63 | Ht 74.0 in | Wt 212.0 lb

## 2012-02-23 DIAGNOSIS — I119 Hypertensive heart disease without heart failure: Secondary | ICD-10-CM

## 2012-02-23 DIAGNOSIS — D751 Secondary polycythemia: Secondary | ICD-10-CM

## 2012-02-23 DIAGNOSIS — E7800 Pure hypercholesterolemia, unspecified: Secondary | ICD-10-CM

## 2012-02-23 DIAGNOSIS — D45 Polycythemia vera: Secondary | ICD-10-CM

## 2012-02-23 DIAGNOSIS — I82409 Acute embolism and thrombosis of unspecified deep veins of unspecified lower extremity: Secondary | ICD-10-CM

## 2012-02-23 DIAGNOSIS — I10 Essential (primary) hypertension: Secondary | ICD-10-CM

## 2012-02-23 LAB — CBC WITH DIFFERENTIAL/PLATELET
Basophils Absolute: 0 10*3/uL (ref 0.0–0.1)
Basophils Relative: 0.5 % (ref 0.0–3.0)
Eosinophils Absolute: 0.2 10*3/uL (ref 0.0–0.7)
HCT: 47.9 % (ref 39.0–52.0)
Hemoglobin: 16.4 g/dL (ref 13.0–17.0)
Lymphocytes Relative: 22.6 % (ref 12.0–46.0)
Lymphs Abs: 1.6 10*3/uL (ref 0.7–4.0)
MCHC: 34.3 g/dL (ref 30.0–36.0)
MCV: 102.5 fl — ABNORMAL HIGH (ref 78.0–100.0)
Neutro Abs: 4.5 10*3/uL (ref 1.4–7.7)
RBC: 4.67 Mil/uL (ref 4.22–5.81)
RDW: 13.6 % (ref 11.5–14.6)

## 2012-02-23 LAB — BASIC METABOLIC PANEL
CO2: 27 mEq/L (ref 19–32)
Chloride: 100 mEq/L (ref 96–112)
Glucose, Bld: 72 mg/dL (ref 70–99)
Potassium: 4 mEq/L (ref 3.5–5.1)
Sodium: 134 mEq/L — ABNORMAL LOW (ref 135–145)

## 2012-02-23 LAB — HEPATIC FUNCTION PANEL
AST: 45 U/L — ABNORMAL HIGH (ref 0–37)
Albumin: 4.4 g/dL (ref 3.5–5.2)
Total Bilirubin: 0.9 mg/dL (ref 0.3–1.2)

## 2012-02-23 LAB — LIPID PANEL
Cholesterol: 131 mg/dL (ref 0–200)
Total CHOL/HDL Ratio: 4
Triglycerides: 212 mg/dL — ABNORMAL HIGH (ref 0.0–149.0)
VLDL: 42.4 mg/dL — ABNORMAL HIGH (ref 0.0–40.0)

## 2012-02-23 NOTE — Assessment & Plan Note (Signed)
The patient has a history of polycythemia.  He generally donates blood several times a year.  We are checking a CBC today.

## 2012-02-23 NOTE — Assessment & Plan Note (Signed)
Patient has a history of hypercholesterolemia and is tolerating Pravachol without side effects.  Blood work is pending

## 2012-02-23 NOTE — Assessment & Plan Note (Signed)
The patient has had no recurrent DVT.  He takes a automobile trip he make sure to get out and walk every several hours.

## 2012-02-23 NOTE — Progress Notes (Signed)
Quick Note:  Please report to patient. The recent labs are stable. Continue same medication and careful diet.TGs higher. Watch diet. Hgb higher 16.4. Okay to donate blood. ______

## 2012-02-23 NOTE — Progress Notes (Signed)
Patrick Sosa Date of Birth:  1945-11-07 [facility] [address] [place], [place]  [address] [phone]         Fax   [phone]  History of present illness  This pleasant 66 year old gentleman is seen for a scheduled followup office visit.  He has a history of essential hypertension and a history of previous deep vein thrombosis. He has a history of high cholesterol. He has a history of polycythemia. Since last visit he has been doing reasonably well except for some residual back pain which began when he had a pneumonia like episode at the end of 2012. Because of the persistence of his back pain he had a recent MRI of his spine which was negative. The patient was advised to try Aleve 200 mg twice a day for a week which he did and he reports that the discomfort has improved significantly.  Since last visit he states that his back pain has almost completely resolved.  Current Outpatient Prescriptions  Medication Sig Dispense Refill  . amLODipine (NORVASC) 5 MG tablet Take 1 tablet (5 mg total) by mouth daily.  30 tablet  11  . aspirin 325 MG tablet Take 325 mg by mouth daily.        Marland Kitchen dimenhyDRINATE (DRAMAMINE) 50 MG tablet Take 75 mg by mouth at bedtime as needed. For sleep       . metoprolol tartrate (LOPRESSOR) 25 MG tablet TAKE 1 TABLET TWICE DAILY  60 tablet  11  . Multiple Vitamin (MULITIVITAMIN WITH MINERALS) TABS Take 1 tablet by mouth daily.        . pravastatin (PRAVACHOL) 20 MG tablet TAKE 1 TABLET BY MOUTH DAILY  30 tablet  6  . spironolactone (ALDACTONE) 25 MG tablet TAKE 1 TABLET BY MOUTH EVERY DAY  30 tablet  11    No Known Allergies  Patient Active Problem List  Diagnosis  . FACTOR V DEFICIENCY  . HYPERTENSION  . DVT  . ONYCHOMYCOSIS  . Hypertension  . Polycythemia  . DVT (deep venous thrombosis)  . Hypercholesterolemia  . Dry cough    History  Smoking status  . Former Smoker  . Quit date: 10/12/1987  Smokeless tobacco  . Never Used     History  Alcohol Use  . Yes    rarely    Family History  Problem Relation Age of Onset  . Alzheimer's disease Mother   . Pancreatic cancer Father   . Hypertension Father   . Heart attack Father   . Cancer Father   . Hypertension Sister     Review of Systems: Constitutional: no fever chills diaphoresis or fatigue or change in weight.  Head and neck: no hearing loss, no epistaxis, no photophobia or visual disturbance. Respiratory: No cough, shortness of breath or wheezing. Cardiovascular: No chest pain peripheral edema, palpitations. Gastrointestinal: No abdominal distention, no abdominal pain, no change in bowel habits hematochezia or melena. Genitourinary: No dysuria, no frequency, no urgency, no nocturia. Musculoskeletal:No arthralgias, no back pain, no gait disturbance or myalgias. Neurological: No dizziness, no headaches, no numbness, no seizures, no syncope, no weakness, no tremors. Hematologic: No lymphadenopathy, no easy bruising. Psychiatric: No confusion, no hallucinations, no sleep disturbance.    Physical Exam: Filed Vitals:   02/23/12 0955  BP: 138/80  Pulse: 63   the general appearance reveals a well-developed well-nourished gentleman in no distress.The head and neck exam reveals pupils equal and reactive.  Extraocular movements are full.  There is no  scleral icterus.  The mouth and pharynx are normal.  The neck is supple.  The carotids reveal no bruits.  The jugular venous pressure is normal.  The  thyroid is not enlarged.  There is no lymphadenopathy.  The chest is clear to percussion and auscultation.  There are no rales or rhonchi.  Expansion of the chest is symmetrical.  The precordium is quiet.  The first heart sound is normal.  The second heart sound is physiologically split.  There is no murmur gallop rub or click.  There is no abnormal lift or heave.  The abdomen is soft and nontender.  The bowel sounds are normal.  The liver and spleen are not enlarged.   There are no abdominal masses.  There are no abdominal bruits.  Extremities reveal good pedal pulses.  There is no phlebitis or edema.  There is no cyanosis or clubbing.  Strength is normal and symmetrical in all extremities.  There is no lateralizing weakness.  There are no sensory deficits.  The skin is warm and dry.  There is no rash.  EKG shows normal sinus rhythm and is within normal limits.   Assessment / Plan: Blood work today is pending.  Continue same medication.  Recheck in 4 months for followup office visit fasting lab work and CBC

## 2012-02-23 NOTE — Assessment & Plan Note (Signed)
The patient has not had any chest pain or shortness of breath.  He walks daily for exercise.  He still works part-time intends to retire fully November 2013

## 2012-02-23 NOTE — Patient Instructions (Addendum)
Your physician recommends that you continue on your current medications as directed. Please refer to the Current Medication list given to you today.  Your physician wants you to follow-up in: 4 months with fasting labs (lp/bmet/hfp/cbc)  You will receive a reminder letter in the mail two months in advance. If you don't receive a letter, please call our office to schedule the follow-up appointment.   Will obtain labs today and call you with the results

## 2012-02-24 ENCOUNTER — Telehealth: Payer: Self-pay | Admitting: *Deleted

## 2012-02-24 NOTE — Telephone Encounter (Signed)
Advised patient of lab results  

## 2012-02-24 NOTE — Telephone Encounter (Signed)
Message copied by [name] on Thu Feb 24, 2012  4:31 PM ------      Message from: Cassell Clement      Created: Wed Feb 23, 2012  7:12 PM       Please report to patient.  The recent labs are stable. Continue same medication and careful diet.TGs higher. Watch diet. Hgb higher 16.4.  Okay to donate blood.

## 2012-03-03 ENCOUNTER — Other Ambulatory Visit: Payer: Self-pay | Admitting: Cardiology

## 2012-06-13 IMAGING — CR DG CHEST DECUBITUS*R*
2 series · 2 of 2 positions shown · non-contrast
Comparison: 04/27/2011

CLINICAL DATA: Cough., pneumonia.

CHEST - RIGHT DECUBITUS

[view not recorded (1 of 2)]
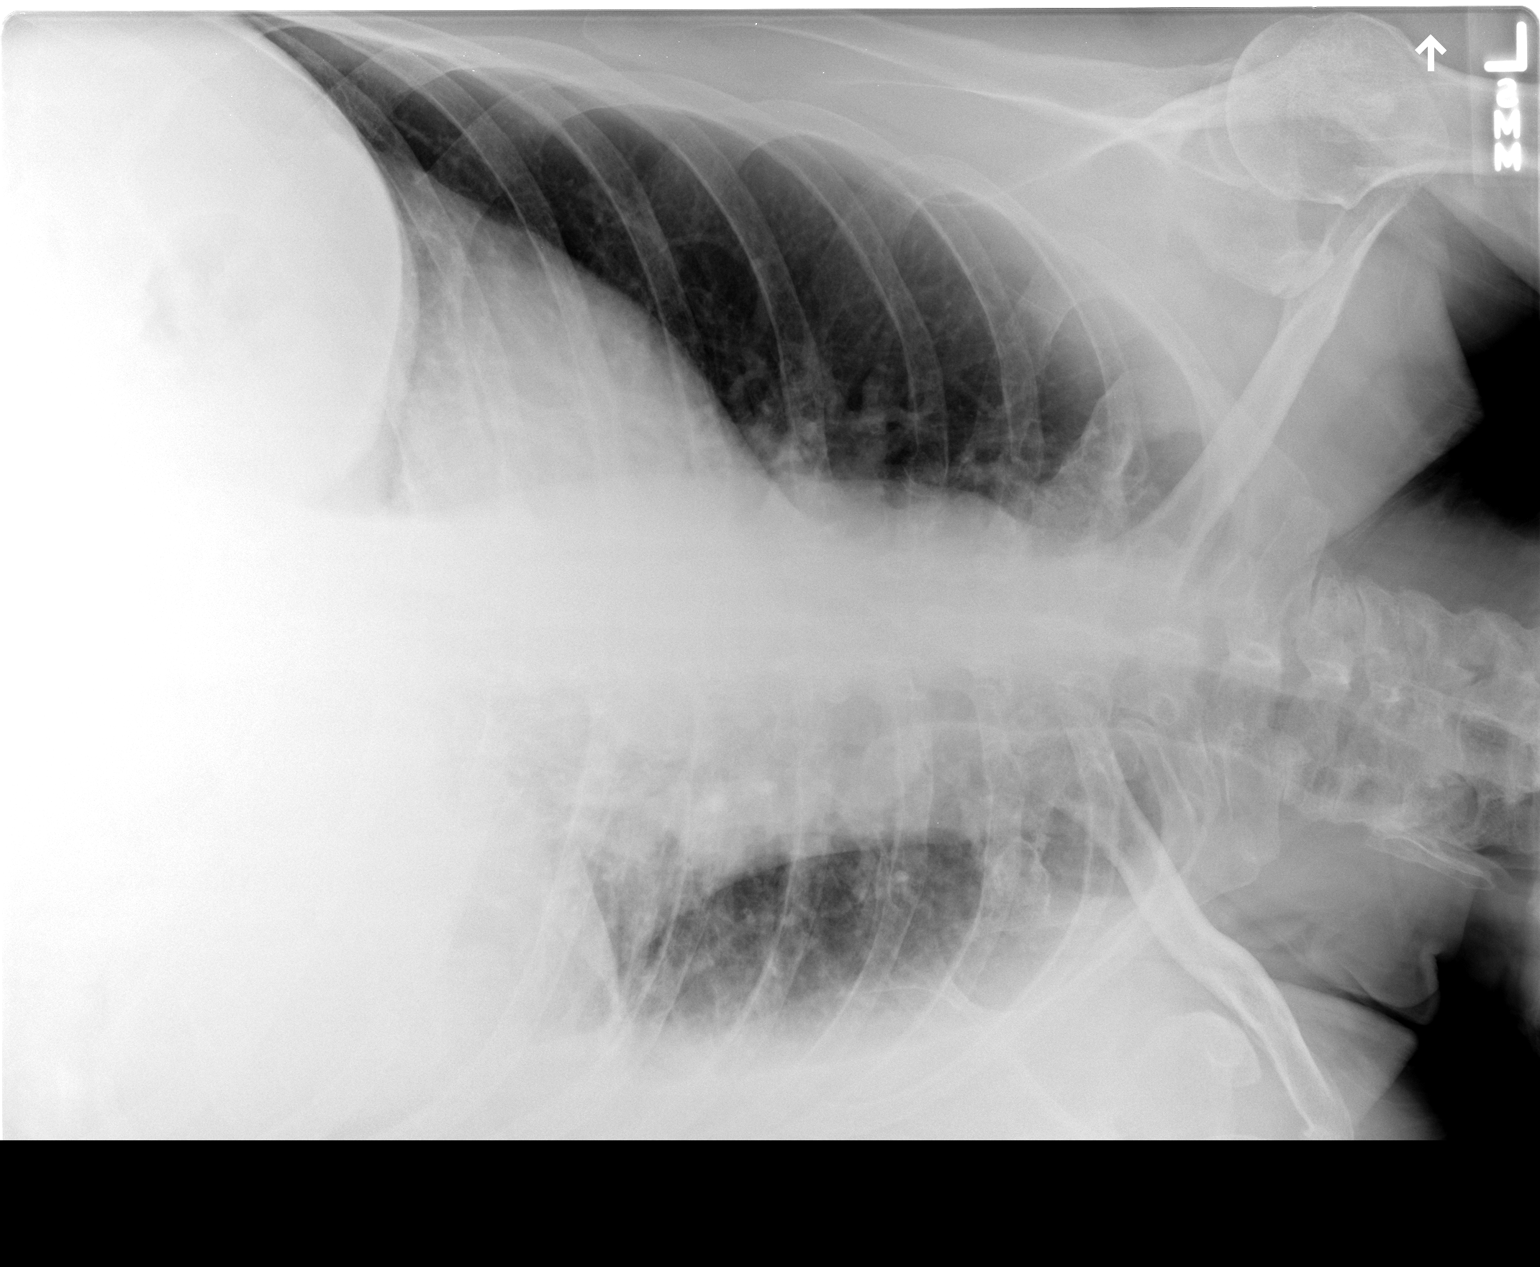

[view not recorded (2 of 2)]
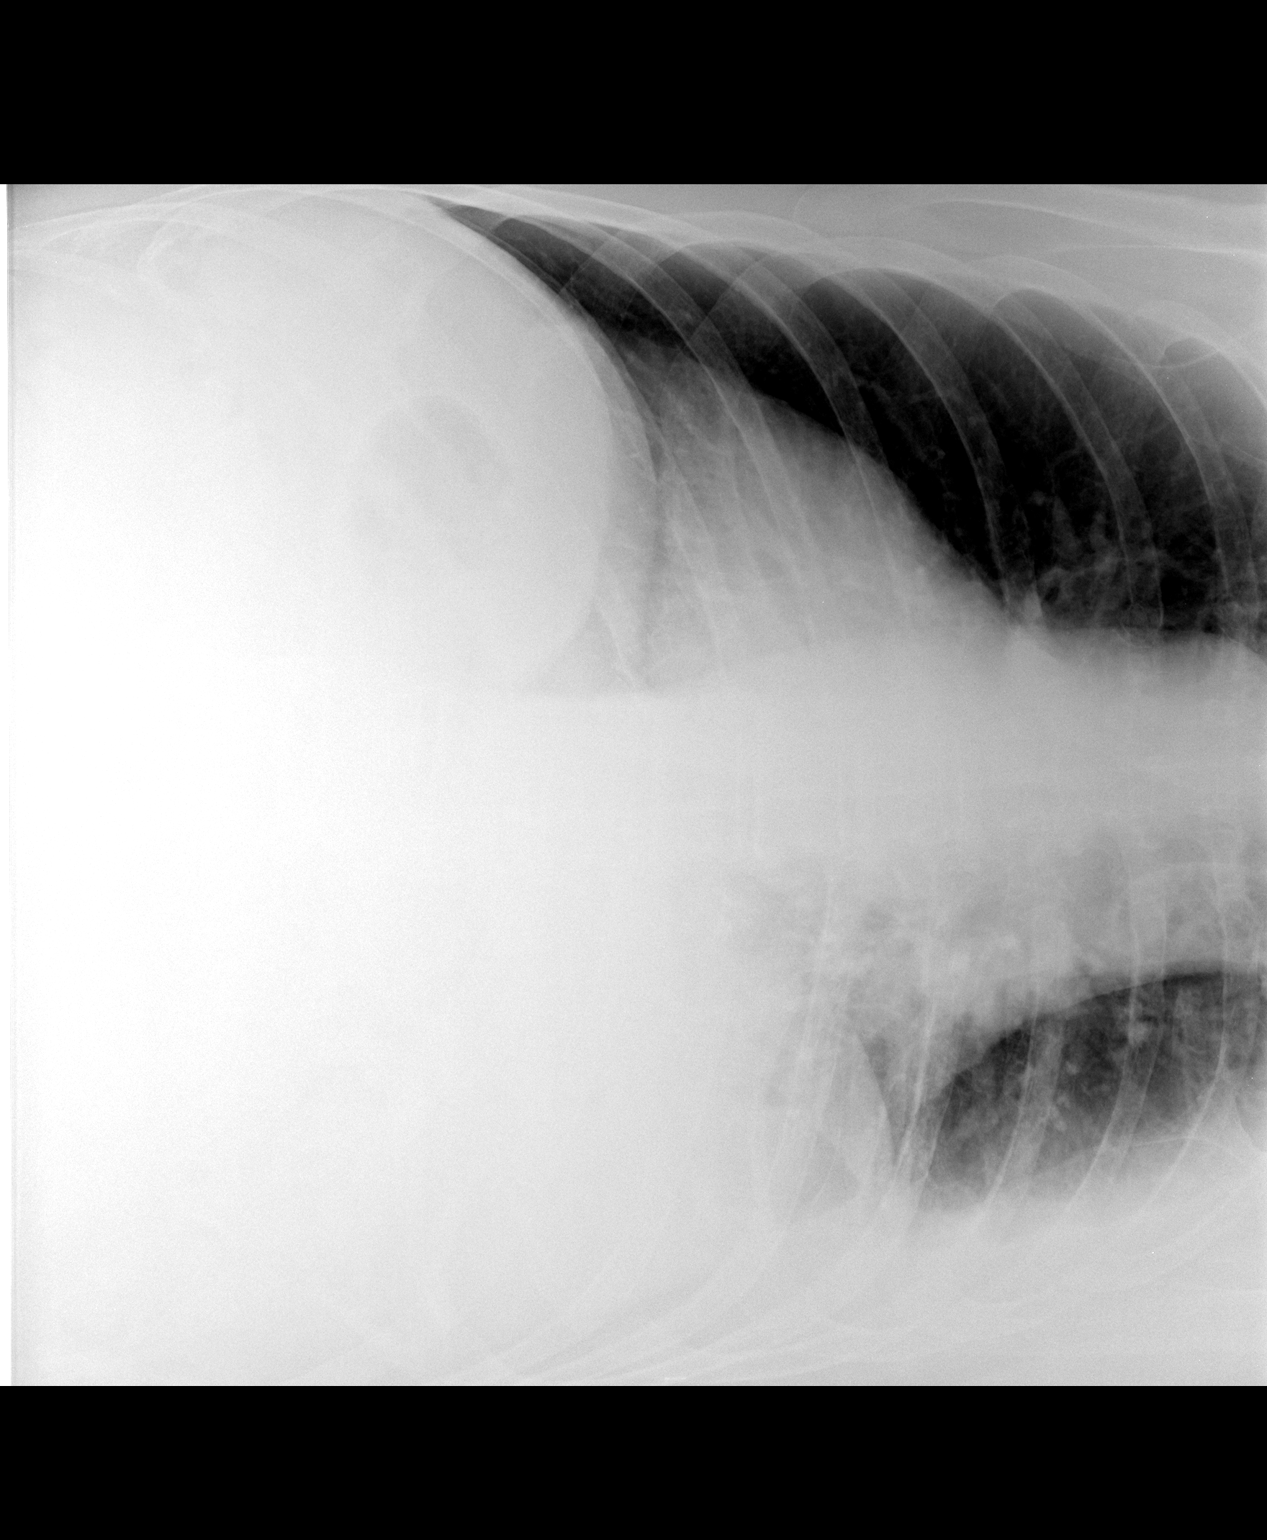

[2 of 2 positions shown; findings below may reference images not displayed]

FINDINGS: Moderate free flowing right pleural effusion noted on
decubitus view.
IMPRESSION: Moderate free flowing right effusion.

## 2012-06-13 IMAGING — CR DG CHEST 2V
2 series · 2 of 2 positions shown · non-contrast
Comparison: 06/14/2011

CLINICAL DATA: Cough

CHEST - 2 VIEW

[view not recorded (1 of 2)]
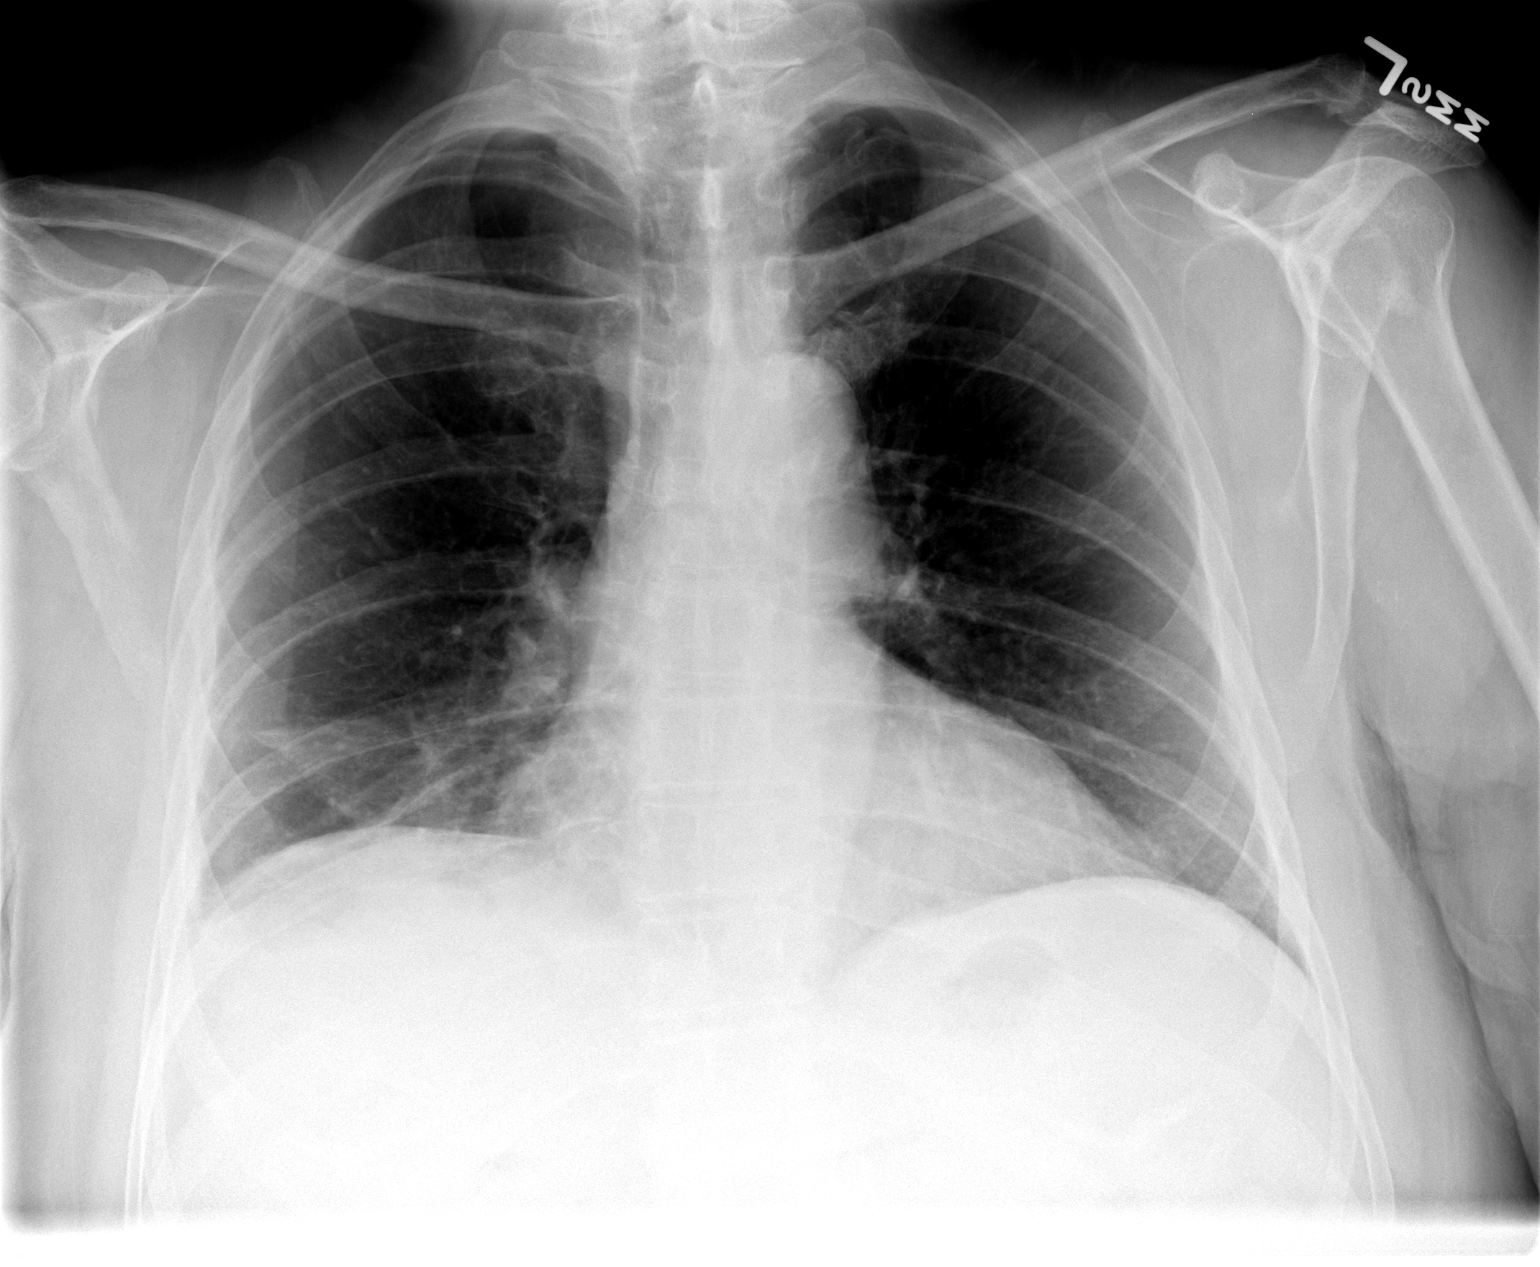

[view not recorded (2 of 2)]
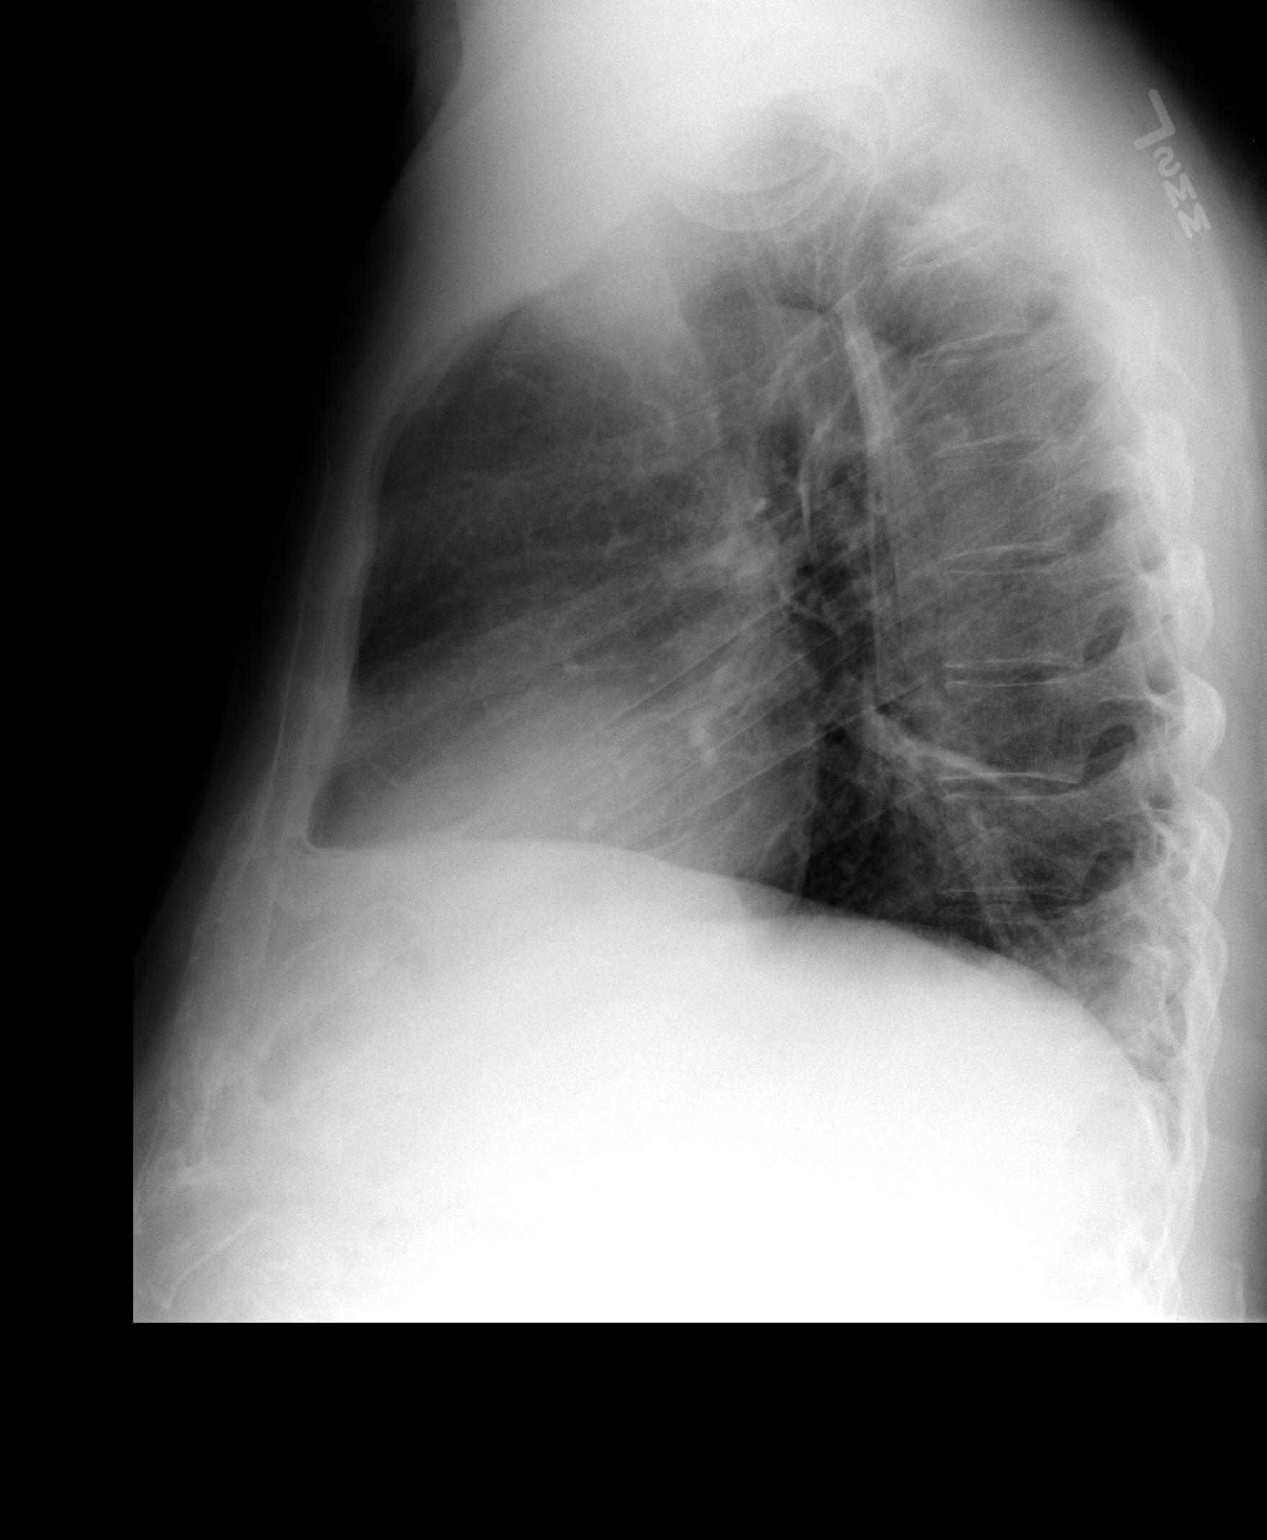

[2 of 2 positions shown; findings below may reference images not displayed]

FINDINGS: Heart size appears normal.

Atelectasis or scarring is noted within the right lung base.

Left lung is clear.

No effusions or interstitial edema.
IMPRESSION: 1.  Right lung base scarring or atelectasis.

## 2012-06-29 ENCOUNTER — Ambulatory Visit (INDEPENDENT_AMBULATORY_CARE_PROVIDER_SITE_OTHER): Payer: 59 | Admitting: Cardiology

## 2012-06-29 ENCOUNTER — Encounter: Payer: Self-pay | Admitting: Cardiology

## 2012-06-29 ENCOUNTER — Other Ambulatory Visit (INDEPENDENT_AMBULATORY_CARE_PROVIDER_SITE_OTHER): Payer: 59

## 2012-06-29 VITALS — BP 124/82 | HR 60 | Ht 74.0 in | Wt 216.0 lb

## 2012-06-29 DIAGNOSIS — I10 Essential (primary) hypertension: Secondary | ICD-10-CM

## 2012-06-29 DIAGNOSIS — E7800 Pure hypercholesterolemia, unspecified: Secondary | ICD-10-CM

## 2012-06-29 DIAGNOSIS — I119 Hypertensive heart disease without heart failure: Secondary | ICD-10-CM

## 2012-06-29 DIAGNOSIS — I82409 Acute embolism and thrombosis of unspecified deep veins of unspecified lower extremity: Secondary | ICD-10-CM

## 2012-06-29 DIAGNOSIS — D751 Secondary polycythemia: Secondary | ICD-10-CM

## 2012-06-29 DIAGNOSIS — D45 Polycythemia vera: Secondary | ICD-10-CM

## 2012-06-29 LAB — CBC WITH DIFFERENTIAL/PLATELET
Basophils Relative: 0.7 % (ref 0.0–3.0)
Eosinophils Absolute: 0.2 10*3/uL (ref 0.0–0.7)
Hemoglobin: 16.4 g/dL (ref 13.0–17.0)
MCHC: 34.3 g/dL (ref 30.0–36.0)
MCV: 103.4 fl — ABNORMAL HIGH (ref 78.0–100.0)
Monocytes Absolute: 0.8 10*3/uL (ref 0.1–1.0)
Neutro Abs: 5.5 10*3/uL (ref 1.4–7.7)
RBC: 4.62 Mil/uL (ref 4.22–5.81)
RDW: 12.5 % (ref 11.5–14.6)

## 2012-06-29 LAB — HEPATIC FUNCTION PANEL
ALT: 34 U/L (ref 0–53)
AST: 40 U/L — ABNORMAL HIGH (ref 0–37)
Albumin: 4.5 g/dL (ref 3.5–5.2)
Alkaline Phosphatase: 56 U/L (ref 39–117)
Bilirubin, Direct: 0.1 mg/dL (ref 0.0–0.3)
Total Bilirubin: 1.3 mg/dL — ABNORMAL HIGH (ref 0.3–1.2)
Total Protein: 8.3 g/dL (ref 6.0–8.3)

## 2012-06-29 LAB — BASIC METABOLIC PANEL
CO2: 27 mEq/L (ref 19–32)
Chloride: 100 mEq/L (ref 96–112)
Creatinine, Ser: 1.4 mg/dL (ref 0.4–1.5)
Glucose, Bld: 81 mg/dL (ref 70–99)
Sodium: 134 mEq/L — ABNORMAL LOW (ref 135–145)

## 2012-06-29 LAB — LIPID PANEL
LDL Cholesterol: 57 mg/dL (ref 0–99)
Total CHOL/HDL Ratio: 4
Triglycerides: 195 mg/dL — ABNORMAL HIGH (ref 0.0–149.0)

## 2012-06-29 NOTE — Patient Instructions (Addendum)
STOP YOUR MULTIVITAMIN OTHERWISE KEEP ALL OTHER MEDICATIONS THE SAME  Your physician wants you to follow-up in: 4 months You will receive a reminder letter in the mail two months in advance. If you don't receive a letter, please call our office to schedule the follow-up appointment.   Will obtain labs today and call you with the results (lp/bmet/hfp/cbc)

## 2012-06-29 NOTE — Assessment & Plan Note (Signed)
We are checking a CBC today

## 2012-06-29 NOTE — Assessment & Plan Note (Signed)
Blood pressure is staying stable on current therapy 

## 2012-06-29 NOTE — Assessment & Plan Note (Signed)
The patient is not experiencing any recurrent DVT

## 2012-06-29 NOTE — Progress Notes (Signed)
Quick Note:  Please report to patient. The recent labs are stable. Continue same medication and careful diet. TG better. Hgb 16.4 ______

## 2012-06-29 NOTE — Progress Notes (Signed)
Patrick Sosa Date of Birth:  August 14, 1945 [facility] [address] [place], [place]  [address] [phone]  Fax   [phone]  HPI: This pleasant 67 year old gentleman is seen for a scheduled followup office visit. He has a history of essential hypertension and a history of previous deep vein thrombosis. He has a history of high cholesterol. He has a history of polycythemia.  His last visit he has been feeling well.  He has been getting blood for help with his polycythemia.  He has donated one time since we last saw him.  The patient retired in November.  Current Outpatient Prescriptions  Medication Sig Dispense Refill  . amLODipine (NORVASC) 5 MG tablet Take 1 tablet (5 mg total) by mouth daily.  30 tablet  11  . aspirin 325 MG tablet Take 325 mg by mouth daily.        Marland Kitchen dimenhyDRINATE (DRAMAMINE) 50 MG tablet Take 75 mg by mouth at bedtime as needed. For sleep       . metoprolol tartrate (LOPRESSOR) 25 MG tablet TAKE 1 TABLET TWICE DAILY  60 tablet  11  . pravastatin (PRAVACHOL) 20 MG tablet TAKE 1 TABLET BY MOUTH DAILY  30 tablet  6  . spironolactone (ALDACTONE) 25 MG tablet TAKE 1 TABLET BY MOUTH EVERY DAY  30 tablet  9    No Known Allergies  Patient Active Problem List  Diagnosis  . FACTOR V DEFICIENCY  . HYPERTENSION  . DVT  . ONYCHOMYCOSIS  . Hypertension  . Polycythemia  . DVT (deep venous thrombosis)  . Hypercholesterolemia  . Dry cough    History  Smoking status  . Former Smoker  . Quit date: 10/12/1987  Smokeless tobacco  . Never Used    History  Alcohol Use  . Yes    Comment: rarely    Family History  Problem Relation Age of Onset  . Alzheimer's disease Mother   . Pancreatic cancer Father   . Hypertension Father   . Heart attack Father   . Cancer Father   . Hypertension Sister     Review of Systems: The patient denies any heat or cold intolerance.  No weight gain or weight loss.  The patient denies headaches or blurry  vision.  There is no cough or sputum production.  The patient denies dizziness.  There is no hematuria or hematochezia.  The patient denies any muscle aches or arthritis.  The patient denies any rash.  The patient denies frequent falling or instability.  There is no history of depression or anxiety.  All other systems were reviewed and are negative.   Physical Exam: Filed Vitals:   06/29/12 0856  BP: 124/82  Pulse: 60   the general appearance reveals a well-developed well-nourished gentleman in no distress.  His weight is up 4 pounds since last visit.The head and neck exam reveals pupils equal and reactive.  Extraocular movements are full.  There is no scleral icterus.  The mouth and pharynx are normal.  The neck is supple.  The carotids reveal no bruits.  The jugular venous pressure is normal.  The  thyroid is not enlarged.  There is no lymphadenopathy.  The chest is clear to percussion and auscultation.  There are no rales or rhonchi.  Expansion of the chest is symmetrical.  The precordium is quiet.  The first heart sound is normal.  The second heart sound is physiologically split.  There is no murmur gallop rub or click.  There  is no abnormal lift or heave.  The abdomen is soft and nontender.  The bowel sounds are normal.  The liver and spleen are not enlarged.  There are no abdominal masses.  There are no abdominal bruits.  Extremities reveal good pedal pulses.  There is no phlebitis or edema.  There is no cyanosis or clubbing.  Strength is normal and symmetrical in all extremities.  There is no lateralizing weakness.  There are no sensory deficits.  The skin is warm and dry.  There is no rash.      Assessment / Plan: Continue same medication.  He will stop his multivitamin now.  Blood work today pending.  Recheck in 4 months for office visit and lab work including CBC lipid panel hepatic function panel and basal metabolic panel

## 2012-06-30 ENCOUNTER — Telehealth: Payer: Self-pay | Admitting: *Deleted

## 2012-06-30 NOTE — Telephone Encounter (Signed)
Advised patient of lab results  

## 2012-06-30 NOTE — Telephone Encounter (Signed)
Message copied by [name] on Fri Jun 30, 2012  9:28 AM ------      Message from: Cassell Clement      Created: Thu Jun 29, 2012  9:17 PM       Please report to patient.  The recent labs are stable. Continue same medication and careful diet. TG better. Hgb 16.4

## 2012-09-13 ENCOUNTER — Other Ambulatory Visit: Payer: Self-pay | Admitting: *Deleted

## 2012-09-13 MED ORDER — PRAVASTATIN SODIUM 20 MG PO TABS: 20.0000 mg | ORAL_TABLET | Freq: Every day | ORAL | Status: DC

## 2012-09-21 ENCOUNTER — Ambulatory Visit (INDEPENDENT_AMBULATORY_CARE_PROVIDER_SITE_OTHER): Payer: 59 | Admitting: Family Medicine

## 2012-09-21 ENCOUNTER — Telehealth: Payer: Self-pay | Admitting: Internal Medicine

## 2012-09-21 ENCOUNTER — Encounter: Payer: Self-pay | Admitting: Family Medicine

## 2012-09-21 VITALS — BP 126/76 | HR 76 | Temp 98.5°F | Wt 215.0 lb

## 2012-09-21 DIAGNOSIS — J209 Acute bronchitis, unspecified: Secondary | ICD-10-CM

## 2012-09-21 MED ORDER — DOXYCYCLINE HYCLATE 100 MG PO TABS: 100.0000 mg | ORAL_TABLET | Freq: Two times a day (BID) | ORAL | Status: DC

## 2012-09-21 MED ORDER — BENZONATATE 100 MG PO CAPS: 100.0000 mg | ORAL_CAPSULE | Freq: Three times a day (TID) | ORAL | Status: DC | PRN

## 2012-09-21 NOTE — Progress Notes (Signed)
Chief Complaint  Patient presents with  . Cough    from flu about 2 weeks ago; cough persist     HPI: Acute visit for cough: -started: 2-3 weeks ago -symptoms:nasal congestion, sore throat, cough, fever a few weeks ago - that has improved but productive cough has remained, occ wheezing -denies:fever, NVD, tooth pain -has tried: musinex -sick contacts: wife was sick, but she got all better -Hx of: no hx chronic lung disease but had pneumonia about 1 year  ROS: See pertinent positives and negatives per HPI.  Past Medical History  Diagnosis Date  . Hypertension   . Polycythemia   . DVT (deep venous thrombosis)   . Hypercholesterolemia   . Dry cough   . DVT (deep venous thrombosis)     Family History  Problem Relation Age of Onset  . Alzheimer's disease Mother   . Pancreatic cancer Father   . Hypertension Father   . Heart attack Father   . Cancer Father   . Hypertension Sister     History   Social History  . Marital Status: Married    Spouse Name: N/A    Number of Children: N/A  . Years of Education: N/A   Social History Main Topics  . Smoking status: Former Smoker    Quit date: 10/12/1987  . Smokeless tobacco: Never Used  . Alcohol Use: Yes     Comment: rarely  . Drug Use: No  . Sexually Active: None   Other Topics Concern  . None   Social History Narrative  . None    Current outpatient prescriptions:amLODipine (NORVASC) 5 MG tablet, Take 1 tablet (5 mg total) by mouth daily., Disp: 30 tablet, Rfl: 11;  aspirin 325 MG tablet, Take 325 mg by mouth daily.  , Disp: , Rfl: ;  dimenhyDRINATE (DRAMAMINE) 50 MG tablet, Take 75 mg by mouth at bedtime as needed. For sleep , Disp: , Rfl: ;  metoprolol tartrate (LOPRESSOR) 25 MG tablet, TAKE 1 TABLET TWICE DAILY, Disp: 60 tablet, Rfl: 11 pravastatin (PRAVACHOL) 20 MG tablet, Take 1 tablet (20 mg total) by mouth daily., Disp: 30 tablet, Rfl: 6;  spironolactone (ALDACTONE) 25 MG tablet, TAKE 1 TABLET BY MOUTH EVERY DAY,  Disp: 30 tablet, Rfl: 9;  benzonatate (TESSALON PERLES) 100 MG capsule, Take 1 capsule (100 mg total) by mouth 3 (three) times daily as needed for cough., Disp: 20 capsule, Rfl: 0 doxycycline (VIBRA-TABS) 100 MG tablet, Take 1 tablet (100 mg total) by mouth 2 (two) times daily., Disp: 20 tablet, Rfl: 0  EXAM:  Filed Vitals:   09/21/12 1540  BP: 126/76  Pulse: 76  Temp: 98.5 F (36.9 C)    Body mass index is 27.59 kg/(m^2).  GENERAL: vitals reviewed and listed above, alert, oriented, appears well hydrated and in no acute distress  HEENT: atraumatic, conjunttiva clear, no obvious abnormalities on inspection of external nose and ears, normal appearance of ear canals and TMs, clear nasal congestion, mild post oropharyngeal erythema with PND, no tonsillar edema or exudate, no sinus TTP  NECK: no obvious masses on inspection  LUNGS: clear to auscultation bilaterally, no wheezes, rales or rhonchi, good air movement  CV: HRRR, no peripheral edema  MS: moves all extremities without noticeable abnormality  PSYCH: pleasant and cooperative, no obvious depression or anxiety  ASSESSMENT AND PLAN:  Discussed the following assessment and plan:  Acute bronchitis - Plan: benzonatate (TESSALON PERLES) 100 MG capsule, doxycycline (VIBRA-TABS) 100 MG tablet  -likely viral, lungs sound good  today - advised given length of symptoms could do prednisone, abx, cough med - he prefer abx if not getting better over next week and trial of tessalon perles. Risks and return precautions discussed. -Patient advised to return or notify a doctor immediately if symptoms worsen or persist or new concerns arise.  There are no Patient Instructions on file for this visit.   Kriste Basque R.

## 2012-09-21 NOTE — Telephone Encounter (Signed)
Patient Information:  Caller Name: [place]  Phone: [phone]  Patient: Patrick Sosa, Patrick Sosa  Gender: Male  DOB: September 08, 1945  Age: 67 Years  PCP: Birdie Sons (Adults only)  Office Follow Up:  Does the office need to follow up with this patient?: No  Instructions For The Office: N/A  RN Note:  Reports can feel respiratory congestion but very difficult to cough up clear/white phlem. May try  Mucinex, Robitussin or Guaifenesin as directed. Requested appointment after 1600 due to his schedule.  Symptoms  Reason For Call & Symptoms: Ongoing dry cough after "influenza,.  Had fever X 2 days with URI symptoms and body aches. .  Reviewed Health History In EMR: Yes  Reviewed Medications In EMR: Yes  Reviewed Allergies In EMR: Yes  Reviewed Surgeries / Procedures: Yes  Date of Onset of Symptoms: 09/07/2012  Treatments Tried: cough drops, fluids  Treatments Tried Worked: Yes  Guideline(s) Used:  Cough  Disposition Per Guideline:   See Within 3 Days in Office  Reason For Disposition Reached:   Cough has been present for > 10 days  Advice Given:  Reassurance  Coughing is the way that our lungs remove irritants and mucus. It helps protect our lungs from getting pneumonia.  You can get a dry hacking cough after a chest cold. Sometimes this type of cough can last 1-3 weeks, and be worse at night.  Cough Medicines:  OTC Cough Drops: Cough drops can help a lot, especially for mild coughs. They reduce coughing by soothing your irritated throat and removing that tickle sensation in the back of the throat. Cough drops also have the advantage of portability - you can carry them with you.  Home Remedy - Hard Candy: Hard candy works just as well as medicine-flavored OTC cough drops. Diabetics should use sugar-free candy.  Home Remedy - Honey: This old home remedy has been shown to help decrease coughing at night. The adult dosage is 2 teaspoons (10 ml) at bedtime. Honey should not be given to infants under  one year of age.  Coughing Spasms:  Drink warm fluids. Inhale warm mist (Reason: both relax the airway and loosen up the phlegm).  Suck on cough drops or hard candy to coat the irritated throat.  Expected Course:   Viral bronchitis (chest cold) causes a cough that lasts 1 to 3 weeks. Sometimes you may cough up lots of phlegm (sputum, mucus). The mucus can normally be white, gray, yellow, or green.  Call Back If:  Difficulty breathing  Cough lasts more than 3 weeks  Fever lasts > 3 days  You become worse.  Patient Will Follow Care Advice:  YES  Appointment Scheduled:  09/21/2012 16:00:00 Appointment Scheduled Provider:  Kriste Basque [facility])

## 2012-10-10 ENCOUNTER — Ambulatory Visit (INDEPENDENT_AMBULATORY_CARE_PROVIDER_SITE_OTHER): Payer: 59 | Admitting: Family Medicine

## 2012-10-10 ENCOUNTER — Encounter: Payer: Self-pay | Admitting: Family Medicine

## 2012-10-10 ENCOUNTER — Other Ambulatory Visit: Payer: Self-pay | Admitting: *Deleted

## 2012-10-10 VITALS — BP 102/80 | Temp 98.0°F | Wt 216.0 lb

## 2012-10-10 DIAGNOSIS — I119 Hypertensive heart disease without heart failure: Secondary | ICD-10-CM

## 2012-10-10 DIAGNOSIS — M79609 Pain in unspecified limb: Secondary | ICD-10-CM

## 2012-10-10 DIAGNOSIS — M79672 Pain in left foot: Secondary | ICD-10-CM

## 2012-10-10 MED ORDER — AMLODIPINE BESYLATE 5 MG PO TABS: 5.0000 mg | ORAL_TABLET | Freq: Every day | ORAL | Status: DC

## 2012-10-10 NOTE — Progress Notes (Signed)
Chief Complaint  Patient presents with  . 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  Past Medical History  Diagnosis Date  . Hypertension   . Polycythemia   . DVT (deep venous thrombosis)   . Hypercholesterolemia   . Dry cough   . DVT (deep venous thrombosis)     Family History  Problem Relation Age of Onset  . Alzheimer's disease Mother   . Pancreatic cancer Father   . Hypertension Father   . Heart attack Father   . Cancer Father   . Hypertension Sister     History   Social History  . Marital Status: Married    Spouse Name: N/A    Number of Children: N/A  . Years of Education: N/A   Social History Main Topics  . Smoking status: Former Smoker    Quit date: 10/12/1987  . Smokeless tobacco: Never Used  . Alcohol Use: Yes     Comment: rarely  . Drug Use: No  . Sexually Active: None   Other Topics Concern  . None   Social History Narrative  . None    Current outpatient prescriptions:amLODipine (NORVASC) 5 MG tablet, Take 1 tablet (5 mg total) by mouth daily., Disp: 30 tablet, Rfl: 11;  aspirin 325 MG tablet, Take 325 mg by mouth daily.  , Disp: , Rfl: ;  dimenhyDRINATE (DRAMAMINE) 50 MG tablet, Take 75 mg by mouth at bedtime as needed. For sleep , Disp: , Rfl: ;  metoprolol tartrate (LOPRESSOR) 25 MG tablet, TAKE 1 TABLET TWICE DAILY, Disp: 60 tablet, Rfl: 11 pravastatin (PRAVACHOL) 20 MG tablet, Take 1 tablet (20 mg total) by mouth daily., Disp:  30 tablet, Rfl: 6;  spironolactone (ALDACTONE) 25 MG tablet, TAKE 1 TABLET BY MOUTH EVERY DAY, Disp: 30 tablet, Rfl: 9  EXAM:  Filed Vitals:   10/10/12 1345  BP: 102/80  Temp: 98 F (36.7 C)    Body mass index is 27.72 kg/(m^2).  GENERAL: vitals reviewed and listed above, alert, oriented, appears well hydrated and in no acute distress  HEENT: atraumatic, conjunttiva clear, no obvious abnormalities on inspection of external nose and ears  NECK: no obvious masses on inspection  LUNGS: clear to auscultation bilaterally, no wheezes, rales or rhonchi, good air movement  CV: HRRR, no peripheral edema  MS: moves all extremities without noticeable abnormality -pes planus bilat - worse on L -no swelling or warmth of any ankle or foot joints, no bony or soft tissue TTP, normal pedal pulses and cap refill, neg talar tilt and ant/post drawer   PSYCH: pleasant and cooperative, no obvious depression or anxiety  ASSESSMENT AND PLAN:  Discussed the following assessment and plan:  Foot pain, left  -no findings on exam to suggest gout or infectious process or joint pathologies. Area where pt points to is soft tissues between L malleoulus and achilles. Description suggest a neuropathic  etiology - very small and localized area and suspect cutaneous nerve involvement related to pressure from shoes. Advised he see his podiatrist. Follow up in 1 month if persists and not tx by podiatry with change in shoes/inserts. -Patient advised to return or notify a doctor immediately if symptoms worsen or persist or new concerns arise.  There are no Patient Instructions on file for this visit.   Kriste Basque R.

## 2012-10-25 ENCOUNTER — Ambulatory Visit (INDEPENDENT_AMBULATORY_CARE_PROVIDER_SITE_OTHER): Payer: 59 | Admitting: Cardiology

## 2012-10-25 ENCOUNTER — Encounter: Payer: Self-pay | Admitting: Cardiology

## 2012-10-25 VITALS — BP 140/84 | HR 73 | Ht 74.0 in | Wt 216.8 lb

## 2012-10-25 DIAGNOSIS — I10 Essential (primary) hypertension: Secondary | ICD-10-CM

## 2012-10-25 DIAGNOSIS — E7800 Pure hypercholesterolemia, unspecified: Secondary | ICD-10-CM

## 2012-10-25 DIAGNOSIS — I119 Hypertensive heart disease without heart failure: Secondary | ICD-10-CM

## 2012-10-25 NOTE — Assessment & Plan Note (Signed)
Blood pressure is borderline high today.  On repeat I get 140/84.  His weight is unchanged since last visit.  I want him to work harder on weight loss.  We will continue same medication.

## 2012-10-25 NOTE — Progress Notes (Signed)
Patrick Sosa Date of Birth:  Feb 04, 1946 [facility] [address] [place], [place]  [address] [phone]  Fax   [phone]  HPI: This pleasant 67 year old gentleman is seen for a scheduled followup office visit. He has a history of essential hypertension and a history of previous deep vein thrombosis. He has a history of high cholesterol. He has a history of polycythemia. His last visit he has been feeling well. He has been giving blood for help with his polycythemia.  He donated blood earlier this week and mentioned that his hemoglobin was 15. The patient retired in November.   Current Outpatient Prescriptions  Medication Sig Dispense Refill  . amLODipine (NORVASC) 5 MG tablet Take 1 tablet (5 mg total) by mouth daily.  30 tablet  11  . aspirin 325 MG tablet Take 325 mg by mouth daily.        Marland Kitchen dimenhyDRINATE (DRAMAMINE) 50 MG tablet Take 75 mg by mouth at bedtime as needed. For sleep       . metoprolol tartrate (LOPRESSOR) 25 MG tablet TAKE 1 TABLET TWICE DAILY  60 tablet  11  . pravastatin (PRAVACHOL) 20 MG tablet Take 1 tablet (20 mg total) by mouth daily.  30 tablet  6  . spironolactone (ALDACTONE) 25 MG tablet TAKE 1 TABLET BY MOUTH EVERY DAY  30 tablet  9   No current facility-administered medications for this visit.    No Known Allergies  Patient Active Problem List   Diagnosis Date Noted  . Hypertension     Priority: Medium  . Polycythemia     Priority: Medium  . DVT 08/15/2009    Priority: Medium  . HYPERTENSION 08/23/2007    Priority: Medium  . DVT (deep venous thrombosis)   . Hypercholesterolemia   . Dry cough   . 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 Former Smoker  . Quit date: 10/12/1987  Smokeless tobacco  . Never Used    History  Alcohol Use  . Yes    Comment: rarely    Family History  Problem Relation Age of Onset  . Alzheimer's disease Mother   . Pancreatic cancer  Father   . Hypertension Father   . Heart attack Father   . Cancer Father   . Hypertension Sister     Review of Systems: The patient denies any heat or cold intolerance.  No weight gain or weight loss.  The patient denies headaches or blurry vision.  There is no cough or sputum production.  The patient denies dizziness.  There is no hematuria or hematochezia.  The patient denies any muscle aches or arthritis.  The patient denies any rash.  The patient denies frequent falling or instability.  There is no history of depression or anxiety.  All other systems were reviewed and are negative.   Physical Exam: Filed Vitals:   10/25/12 1505  BP: 140/84  Pulse:    the general appearance reveals a well-developed well-nourished middle-aged gentleman in no distress.The head and neck exam reveals pupils equal and reactive.  Extraocular movements are full.  There is no scleral icterus.  The mouth and pharynx are normal.  The neck is supple.  The carotids reveal no bruits.  The jugular venous pressure is normal.  The  thyroid is not enlarged.  There is no lymphadenopathy.  The chest is clear to percussion and auscultation.  There are no rales or rhonchi.  Expansion  of the chest is symmetrical.  The precordium is quiet.  The first heart sound is normal.  The second heart sound is physiologically split.  There is no murmur gallop rub or click.  There is no abnormal lift or heave.  The abdomen is soft and nontender.  The bowel sounds are normal.  The liver and spleen are not enlarged.  There are no abdominal masses.  There are no abdominal bruits.  Extremities reveal good pedal pulses.  There is no phlebitis or edema.  There is no cyanosis or clubbing.  Strength is normal and symmetrical in all extremities.  There is no lateralizing weakness.  There are no sensory deficits.  The skin is warm and dry.  There is no rash.      Assessment / Plan: Continue same medication.  Lose weight.  Recheck in 4 months for  office visit CBC lipid panel hepatic function panel and basal metabolic panel.

## 2012-10-25 NOTE — Assessment & Plan Note (Signed)
Patient has a history of hypercholesterolemia.  He is on pravastatin 20 mg daily.  He does not appear to be having side effects.

## 2012-10-25 NOTE — Patient Instructions (Signed)
Work harder on diet an weight loss  Your physician recommends that you continue on your current medications as directed. Please refer to the Current Medication list given to you today.  Your physician recommends that you schedule a follow-up appointment in: 4 months with fasting labs (lp/bmet/hfp/cbc)

## 2012-10-25 NOTE — Assessment & Plan Note (Signed)
The patient has a history of previous deep vein thrombosis.  He has not had any recent symptoms.  He is getting ready to take another long automobile trip to [place] and they will make sure to stop and get out of the car frequently to walk around to prevent DVT from the trip

## 2012-12-23 ENCOUNTER — Emergency Department (HOSPITAL_COMMUNITY)
Admission: EM | Admit: 2012-12-23 | Discharge: 2012-12-23 | Disposition: A | Discharge status: Home / Self Care | Payer: 59 | Source: Home / Self Care | Attending: Emergency Medicine | Admitting: Emergency Medicine

## 2012-12-23 ENCOUNTER — Encounter (HOSPITAL_COMMUNITY): Payer: Self-pay | Admitting: Emergency Medicine

## 2012-12-23 ENCOUNTER — Ambulatory Visit (HOSPITAL_COMMUNITY)
Admit: 2012-12-23 | Discharge: 2012-12-23 | Disposition: A | Discharge status: Home / Self Care | Payer: 59 | Source: Ambulatory Visit | Attending: Emergency Medicine | Admitting: Emergency Medicine

## 2012-12-23 DIAGNOSIS — I824Z2 Acute embolism and thrombosis of unspecified deep veins of left distal lower extremity: Secondary | ICD-10-CM

## 2012-12-23 DIAGNOSIS — I803 Phlebitis and thrombophlebitis of lower extremities, unspecified: Secondary | ICD-10-CM

## 2012-12-23 DIAGNOSIS — Z86718 Personal history of other venous thrombosis and embolism: Secondary | ICD-10-CM | POA: Insufficient documentation

## 2012-12-23 DIAGNOSIS — I824Z9 Acute embolism and thrombosis of unspecified deep veins of unspecified distal lower extremity: Secondary | ICD-10-CM

## 2012-12-23 DIAGNOSIS — I82890 Acute embolism and thrombosis of other specified veins: Secondary | ICD-10-CM

## 2012-12-23 LAB — CBC WITH DIFFERENTIAL/PLATELET
Basophils Relative: 1 % (ref 0–1)
Eosinophils Absolute: 0.3 10*3/uL (ref 0.0–0.7)
Eosinophils Relative: 4 % (ref 0–5)
Lymphs Abs: 1.7 10*3/uL (ref 0.7–4.0)
MCH: 35.6 pg — ABNORMAL HIGH (ref 26.0–34.0)
MCHC: 36.5 g/dL — ABNORMAL HIGH (ref 30.0–36.0)
MCV: 97.5 fL (ref 78.0–100.0)
Neutrophils Relative %: 60 % (ref 43–77)
Platelets: 172 10*3/uL (ref 150–400)
RDW: 12.3 % (ref 11.5–15.5)

## 2012-12-23 MED ORDER — ENOXAPARIN SODIUM 100 MG/ML ~~LOC~~ SOLN
100.0000 mg | Freq: Once | SUBCUTANEOUS | Status: AC
  Administered 2012-12-23: 100 mg via SUBCUTANEOUS

## 2012-12-23 MED ORDER — ENOXAPARIN (LOVENOX) PATIENT EDUCATION KIT: PACK | Freq: Once | Status: DC

## 2012-12-23 MED ORDER — ENOXAPARIN SODIUM 100 MG/ML ~~LOC~~ SOLN: 100.0000 mg | Freq: Every day | SUBCUTANEOUS | Status: DC

## 2012-12-23 MED ORDER — ENOXAPARIN SODIUM 100 MG/ML ~~LOC~~ SOLN: 100.0000 mg | SUBCUTANEOUS | Status: DC

## 2012-12-23 MED ORDER — ENOXAPARIN SODIUM 100 MG/ML ~~LOC~~ SOLN
100.0000 mg | Freq: Once | SUBCUTANEOUS | Status: DC
  Filled 2012-12-23: qty 1

## 2012-12-23 NOTE — ED Provider Notes (Signed)
Medical screening examination/treatment/procedure(s) were performed by non-physician practitioner and as supervising physician I was immediately available for consultation/collaboration.  Leslee Home, M.D.  Reuben Likes, MD 12/23/12 [phone]

## 2012-12-23 NOTE — Progress Notes (Signed)
VASCULAR LAB PRELIMINARY  PRELIMINARY  PRELIMINARY  PRELIMINARY  Left lower extremity venous duplex completed.    Preliminary report:  Left: DVT noted in the gastrocnemius veins, PTV, and peroneal v.  Superficial thrombosis noted in the LSV up to, but not in the popliteal vein, and in the mid calf GSV.     Samiha Denapoli, RVT 12/23/2012, 1:18 PM

## 2012-12-23 NOTE — ED Notes (Signed)
Vascular tech returned page and can perform exam now.  Patient is waiting for shuttle [facility]

## 2012-12-23 NOTE — ED Provider Notes (Signed)
History    CSN: [id number] Arrival date & time 12/23/12  4098  First MD Initiated Contact with Patient 12/23/12 1039     Chief Complaint  Patient presents with  . Leg Pain    left medial calf pain with knot. area is warm to touch and pain with standing. hx of DVT   (Consider location/radiation/quality/duration/timing/severity/associated sxs/prior Treatment) HPI Comments: 67 year old male presents complaining of swollen, tender, warm did not on the inside of his left calf for 4 days. He has a history of 2 DVTs, most recently in 2011, and wants to make sure she does not have a no. He has been taking 325 mg of aspirin daily for the past 4 days since she noticed this to hopefully keep it from getting worse. In addition to not, he has noticed some mild swelling in the left ankle distal to this. He denies any pain elsewhere, chest pain, shortness of breath, fever, or any other symptoms.  Patient is a 67 y.o. male presenting with leg pain.  Leg Pain Associated symptoms: no fatigue, no fever and no neck pain    Past Medical History  Diagnosis Date  . Hypertension   . Polycythemia   . DVT (deep venous thrombosis)   . Hypercholesterolemia   . Dry cough   . DVT (deep venous thrombosis)    Past Surgical History  Procedure Laterality Date  . Tonsillectomy     Family History  Problem Relation Age of Onset  . Alzheimer's disease Mother   . Pancreatic cancer Father   . Hypertension Father   . Heart attack Father   . Cancer Father   . Hypertension Sister    History  Substance Use Topics  . Smoking status: Former Smoker    Quit date: 10/12/1987  . Smokeless tobacco: Never Used  . Alcohol Use: Yes     Comment: rarely    Review of Systems  Constitutional: Negative for fever, chills and fatigue.  HENT: Negative for sore throat, neck pain and neck stiffness.   Eyes: Negative for visual disturbance.  Respiratory: Negative for cough and shortness of breath.   Cardiovascular: Positive  for leg swelling (see history of present illness). Negative for chest pain and palpitations.  Gastrointestinal: Negative for nausea, vomiting, abdominal pain, diarrhea and constipation.  Genitourinary: Negative for dysuria, urgency, frequency and hematuria.  Musculoskeletal: Negative for myalgias and arthralgias.  Skin: Negative for rash.  Neurological: Negative for dizziness, weakness and light-headedness.    Allergies  Review of patient's allergies indicates no known allergies.  Home Medications   Current Outpatient Rx  Name  Route  Sig  Dispense  Refill  . amLODipine (NORVASC) 5 MG tablet   Oral   Take 1 tablet (5 mg total) by mouth daily.   30 tablet   11   . aspirin 325 MG tablet   Oral   Take 325 mg by mouth daily.           Marland Kitchen dimenhyDRINATE (DRAMAMINE) 50 MG tablet   Oral   Take 75 mg by mouth at bedtime as needed. For sleep          . metoprolol tartrate (LOPRESSOR) 25 MG tablet      TAKE 1 TABLET TWICE DAILY   60 tablet   11   . pravastatin (PRAVACHOL) 20 MG tablet   Oral   Take 1 tablet (20 mg total) by mouth daily.   30 tablet   6   . spironolactone (ALDACTONE) 25 MG  tablet      TAKE 1 TABLET BY MOUTH EVERY DAY   30 tablet   9   . enoxaparin (LOVENOX) 100 MG/ML injection   Subcutaneous   Inject 1 mL (100 mg total) into the skin daily.   1 Syringe   0    BP 141/80  Pulse 63  Temp(Src) 97.9 F (36.6 C) (Oral)  Resp 14  SpO2 99% Physical Exam  Nursing note and vitals reviewed. Constitutional: He is oriented to person, place, and time. He appears well-developed and well-nourished. No distress.  HENT:  Head: Normocephalic and atraumatic.  Eyes: EOM are normal. Pupils are equal, round, and reactive to light.  Cardiovascular: Normal rate and regular rhythm.  Exam reveals no gallop and no friction rub.   No murmur heard. Pulmonary/Chest: Effort normal and breath sounds normal. No respiratory distress. He has no wheezes. He has no rales.   Abdominal: Soft. There is no tenderness.  Musculoskeletal:       Left lower leg: He exhibits tenderness and swelling (There is a palpable tender knot in the medial left mid calf). He exhibits no edema (Mild, non-pitting swelling.).  Neurological: He is oriented to person, place, and time.  Skin: Skin is warm and dry. No rash noted.  Psychiatric: He has a normal mood and affect. Judgment normal.    ED Course  Procedures (including critical care time) Labs Reviewed  D-DIMER, QUANTITATIVE - Abnormal; Notable for the following:    D-Dimer, Quant 2.71 (*)    All other components within normal limits  CBC WITH DIFFERENTIAL - Abnormal; Notable for the following:    Hemoglobin 17.2 (*)    MCH 35.6 (*)    MCHC 36.5 (*)    All other components within normal limits   No results found. 1. DVT, lower extremity, distal, left   2. Superficial vein thrombosis    D-dimer is positive at 2.71. They can do the venous duplex study now so sending him via shuttle Venous duplex was positive for both deep vein thrombosis and superficial vein thrombosis. MDM  We'll give this patient 1 dose of subcutaneous Lovenox here today and a prescription for 2 100 mg syringes to do one injection tomorrow. He will followup with his primary care physician first thing Monday morning.   Meds ordered this encounter  Medications  . enoxaparin (LOVENOX) patient education kit    Sig:   . enoxaparin (LOVENOX) injection 100 mg    Sig:   . DISCONTD: enoxaparin (LOVENOX) 100 MG/ML injection    Sig: Inject 1 mL (100 mg total) into the skin daily.    Dispense:  1 Syringe    Refill:  0  . enoxaparin (LOVENOX) 100 MG/ML injection    Sig: Inject 1 mL (100 mg total) into the skin daily.    Dispense:  2 Syringe    Refill:  0     Graylon Good, PA-C 12/23/12 1339

## 2012-12-23 NOTE — ED Notes (Signed)
Pt reports knot/pain of left medial side of calf x 4 days. Pt states that the area is warm to touch and he has a hx of DVT.  Pt has been taking aspirin for preventative measures if it is a possible DVT. Pt is alert and oriented.

## 2012-12-23 NOTE — ED Notes (Signed)
Waiting for Vascular Tech to return page.   Patient made aware

## 2012-12-23 NOTE — ED Notes (Signed)
Pt waiting lovenox injection. Will discharge pt after injection.

## 2012-12-25 ENCOUNTER — Other Ambulatory Visit: Payer: Self-pay | Admitting: Cardiology

## 2012-12-25 ENCOUNTER — Telehealth: Payer: Self-pay | Admitting: Cardiology

## 2012-12-25 DIAGNOSIS — I82409 Acute embolism and thrombosis of unspecified deep veins of unspecified lower extremity: Secondary | ICD-10-CM

## 2012-12-25 MED ORDER — RIVAROXABAN 15 MG PO TABS: ORAL_TABLET | ORAL | Status: DC

## 2012-12-25 MED ORDER — RIVAROXABAN 20 MG PO TABS: 20.0000 mg | ORAL_TABLET | Freq: Every day | ORAL | Status: DC

## 2012-12-25 NOTE — Telephone Encounter (Signed)
Spoke with  Dr. Patty Sermons regarding this patient will have patient start Xarelto 15 mg twice a day for 2 weeks and then Xarelto 20 mg daily. Advised patient. Scheduled ov for patient tomorrow as well.

## 2012-12-25 NOTE — Telephone Encounter (Signed)
Follow-up:    Patient called in wanting to speak with you again.  Please call back.

## 2012-12-25 NOTE — Telephone Encounter (Signed)
New Prob     Pt states he had a DVT over the weekend, and would like to speak to nurse regarding this.

## 2012-12-26 ENCOUNTER — Ambulatory Visit (INDEPENDENT_AMBULATORY_CARE_PROVIDER_SITE_OTHER): Payer: 59 | Admitting: Cardiology

## 2012-12-26 ENCOUNTER — Encounter: Payer: Self-pay | Admitting: Cardiology

## 2012-12-26 VITALS — BP 134/78 | HR 62 | Ht 74.0 in | Wt 212.4 lb

## 2012-12-26 DIAGNOSIS — I82409 Acute embolism and thrombosis of unspecified deep veins of unspecified lower extremity: Secondary | ICD-10-CM

## 2012-12-26 DIAGNOSIS — E7800 Pure hypercholesterolemia, unspecified: Secondary | ICD-10-CM

## 2012-12-26 DIAGNOSIS — I10 Essential (primary) hypertension: Secondary | ICD-10-CM

## 2012-12-26 DIAGNOSIS — I82402 Acute embolism and thrombosis of unspecified deep veins of left lower extremity: Secondary | ICD-10-CM

## 2012-12-26 NOTE — Assessment & Plan Note (Signed)
Patient has a history of hypercholesterolemia and is on Pravachol.  He denies any myalgias.  We will plan to recheck his lipids and his regular visit in September

## 2012-12-26 NOTE — Assessment & Plan Note (Signed)
Pressure is stable on current therapy

## 2012-12-26 NOTE — Progress Notes (Signed)
Patrick Sosa Date of Birth:  [birth year] [facility] [address] [place], [place]  [address] [phone]         Fax   [phone]  History of Present Illness: This 67 year old gentleman is seen for a work in an office visit.  He has a past history of deep vein thrombosis.  He also has a history of essential hypertension and a history of polycythemia.  In the past he has been on long term courses of Coumadin.  More recently he has been off Coumadin and taking a full aspirin daily.  He was doing well until this weekend when he developed pain in his left calf.  He went to an urgent care where arrangements were made for venous Doppler of his legs which demonstrated deep vein thrombosis as well as superficial thrombosis.  He was initially started on Lovenox injections and yesterday weakness switched him over to Xarelto.  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  This recent episode of DVT was not preceded by any long car trips or airplane trips etc. no history of any recent trauma.  Current Outpatient Prescriptions  Medication Sig Dispense Refill  . amLODipine (NORVASC) 5 MG tablet Take 1 tablet (5 mg total) by mouth daily.  30 tablet  11  . dimenhyDRINATE (DRAMAMINE) 50 MG tablet Take 75 mg by mouth at bedtime as needed. For sleep       . metoprolol tartrate (LOPRESSOR) 25 MG tablet TAKE 1 TABLET TWICE DAILY  60 tablet  11  . pravastatin (PRAVACHOL) 20 MG tablet Take 1 tablet (20 mg total) by mouth daily.  30 tablet  6  . Rivaroxaban (XARELTO) 15 MG TABS tablet 1 tablet twice a day for 2 weeks and then 20 mg daily  28 tablet  0  . spironolactone (ALDACTONE) 25 MG tablet TAKE 1 TABLET BY MOUTH EVERY DAY  30 tablet  0  . Rivaroxaban (XARELTO) 20 MG TABS Take 1 tablet (20 mg total) by mouth daily.  30 tablet  5   No current facility-administered medications for this visit.    No Known Allergies  Patient  Active Problem List   Diagnosis Date Noted  . Hypertension     Priority: Medium  . Polycythemia     Priority: Medium  . DVT 08/15/2009    Priority: Medium  . HYPERTENSION 08/23/2007    Priority: Medium  . DVT (deep venous thrombosis)   . Hypercholesterolemia   . Dry cough   . 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 Former Smoker  . Quit date: 10/12/1987  Smokeless tobacco  . Never Used    History  Alcohol Use  . Yes    Comment: rarely    Family History  Problem Relation Age of Onset  . Alzheimer's disease Mother   . Pancreatic cancer Father   . Hypertension Father   . Heart attack Father   . Cancer Father   . Hypertension Sister     Review of Systems: Constitutional: no fever chills diaphoresis or fatigue or change in weight.  Head and neck: no hearing loss, no epistaxis, no photophobia or visual disturbance. Respiratory: No cough, shortness of breath or wheezing. Cardiovascular: No chest pain peripheral edema, palpitations. Gastrointestinal: No abdominal distention, no abdominal pain, no change in bowel habits hematochezia or melena. Genitourinary: No dysuria, no frequency, no urgency,  no nocturia. Musculoskeletal:No arthralgias, no back pain, no gait disturbance or myalgias. Neurological: No dizziness, no headaches, no numbness, no seizures, no syncope, no weakness, no tremors. Hematologic: No lymphadenopathy, no easy bruising. Psychiatric: No confusion, no hallucinations, no sleep disturbance.    Physical Exam: Filed Vitals:   12/26/12 0827  BP: 134/78  Pulse: 62   general appearance reveals a well-developed well-nourished gentleman in no distress.The head and neck exam reveals pupils equal and reactive.  Extraocular movements are full.  There is no scleral icterus.  The mouth and pharynx are normal.  The neck is supple.  The carotids reveal no bruits.  The jugular venous pressure is normal.  The  thyroid  is not enlarged.  There is no lymphadenopathy.  The chest is clear to percussion and auscultation.  There are no rales or rhonchi.  Expansion of the chest is symmetrical.  The precordium is quiet.  The first heart sound is normal.  The second heart sound is physiologically split.  There is no murmur gallop rub or click.  There is no abnormal lift or heave.  The abdomen is soft and nontender.  The bowel sounds are normal.  The liver and spleen are not enlarged.  There are no abdominal masses.  There are no abdominal bruits.  Extremities reveal good pedal pulses.  There is  phlebitis of the left lower leg.  There is tenderness on the medial aspect of the left lower leg as well as behind the left knee.  Homans sign is negative.  There is no cyanosis or clubbing.  Strength is normal and symmetrical in all extremities.  There is no lateralizing weakness.  There are no sensory deficits.  The skin is warm and dry.  There is no rash.     Assessment / Plan: Continue Xarelto as planned.  Recheck in September for office visit and labs already scheduled

## 2012-12-26 NOTE — Assessment & Plan Note (Signed)
He has normal renal function.  He will take Xarelto 15 mg twice a day for 2 weeks and then 20 mg once a day thereafter

## 2012-12-26 NOTE — Patient Instructions (Addendum)
STOP YOUR ASPIRIN IN 1 WEEK  Your physician recommends that you continue on your current medications as directed. Please refer to the Current Medication list given to you today.  Keep your September office visit

## 2013-01-04 ENCOUNTER — Other Ambulatory Visit: Payer: Self-pay

## 2013-01-04 ENCOUNTER — Telehealth: Payer: Self-pay | Admitting: Cardiology

## 2013-01-04 DIAGNOSIS — I119 Hypertensive heart disease without heart failure: Secondary | ICD-10-CM

## 2013-01-04 MED ORDER — METOPROLOL TARTRATE 25 MG PO TABS: ORAL_TABLET | ORAL | Status: DC

## 2013-01-04 MED ORDER — AMLODIPINE BESYLATE 5 MG PO TABS: 5.0000 mg | ORAL_TABLET | Freq: Every day | ORAL | Status: DC

## 2013-01-04 MED ORDER — SPIRONOLACTONE 25 MG PO TABS: ORAL_TABLET | ORAL | Status: DC

## 2013-01-04 MED ORDER — PRAVASTATIN SODIUM 20 MG PO TABS: 20.0000 mg | ORAL_TABLET | Freq: Every day | ORAL | Status: DC

## 2013-01-04 NOTE — Telephone Encounter (Signed)
New Prob  Pt states that he will not be able to get the Xarelto 90 day supply. He said he can only get the 30 and use his discount card.

## 2013-01-05 NOTE — Telephone Encounter (Signed)
Noted  

## 2013-01-08 ENCOUNTER — Encounter: Payer: Self-pay | Admitting: Cardiology

## 2013-01-08 ENCOUNTER — Telehealth: Payer: Self-pay | Admitting: Cardiology

## 2013-01-08 MED ORDER — RIVAROXABAN 20 MG PO TABS: 20.0000 mg | ORAL_TABLET | Freq: Every day | ORAL | Status: DC

## 2013-01-08 NOTE — Telephone Encounter (Signed)
New Prob  Pt states he is needing a refill of his blood thinner medication.

## 2013-01-09 MED ORDER — RIVAROXABAN 20 MG PO TABS: 20.0000 mg | ORAL_TABLET | Freq: Every day | ORAL | Status: DC

## 2013-01-09 NOTE — Telephone Encounter (Signed)
Refill sent to pharmacy.   

## 2013-01-09 NOTE — Telephone Encounter (Signed)
Please refill.

## 2013-01-15 ENCOUNTER — Other Ambulatory Visit: Payer: Self-pay | Admitting: *Deleted

## 2013-01-15 DIAGNOSIS — I119 Hypertensive heart disease without heart failure: Secondary | ICD-10-CM

## 2013-01-15 MED ORDER — PRAVASTATIN SODIUM 20 MG PO TABS: 20.0000 mg | ORAL_TABLET | Freq: Every day | ORAL | Status: DC

## 2013-01-15 MED ORDER — SPIRONOLACTONE 25 MG PO TABS: ORAL_TABLET | ORAL | Status: DC

## 2013-01-15 MED ORDER — METOPROLOL TARTRATE 25 MG PO TABS: ORAL_TABLET | ORAL | Status: DC

## 2013-01-15 MED ORDER — AMLODIPINE BESYLATE 5 MG PO TABS: 5.0000 mg | ORAL_TABLET | Freq: Every day | ORAL | Status: DC

## 2013-02-20 ENCOUNTER — Other Ambulatory Visit: Payer: Self-pay | Admitting: Cardiology

## 2013-02-22 ENCOUNTER — Other Ambulatory Visit (INDEPENDENT_AMBULATORY_CARE_PROVIDER_SITE_OTHER): Payer: 59

## 2013-02-22 DIAGNOSIS — E7800 Pure hypercholesterolemia, unspecified: Secondary | ICD-10-CM

## 2013-02-22 DIAGNOSIS — I119 Hypertensive heart disease without heart failure: Secondary | ICD-10-CM

## 2013-02-22 LAB — CBC WITH DIFFERENTIAL/PLATELET
Eosinophils Relative: 2.9 % (ref 0.0–5.0)
HCT: 46.3 % (ref 39.0–52.0)
Hemoglobin: 16.2 g/dL (ref 13.0–17.0)
Lymphs Abs: 2 10*3/uL (ref 0.7–4.0)
Monocytes Relative: 9.8 % (ref 3.0–12.0)
Neutro Abs: 4 10*3/uL (ref 1.4–7.7)
WBC: 6.9 10*3/uL (ref 4.5–10.5)

## 2013-02-22 LAB — BASIC METABOLIC PANEL
Chloride: 101 mEq/L (ref 96–112)
GFR: 54.1 mL/min — ABNORMAL LOW (ref >60.00)
Glucose, Bld: 88 mg/dL (ref 70–99)
Potassium: 4.3 mEq/L (ref 3.5–5.1)
Sodium: 135 mEq/L (ref 135–145)

## 2013-02-22 LAB — HEPATIC FUNCTION PANEL
ALT: 27 U/L (ref 0–53)
Total Bilirubin: 0.7 mg/dL (ref 0.3–1.2)
Total Protein: 7.8 g/dL (ref 6.0–8.3)

## 2013-02-22 LAB — LIPID PANEL
HDL: 28.4 mg/dL — ABNORMAL LOW (ref >39.00)
LDL Cholesterol: 49 mg/dL (ref 0–99)
VLDL: 39 mg/dL (ref 0.0–40.0)

## 2013-02-22 NOTE — Progress Notes (Signed)
Quick Note:  Please make copy of labs for patient visit. ______ 

## 2013-02-26 ENCOUNTER — Ambulatory Visit (INDEPENDENT_AMBULATORY_CARE_PROVIDER_SITE_OTHER): Payer: 59 | Admitting: Cardiology

## 2013-02-26 VITALS — BP 144/82 | HR 66 | Ht 74.0 in | Wt 213.0 lb

## 2013-02-26 DIAGNOSIS — I119 Hypertensive heart disease without heart failure: Secondary | ICD-10-CM

## 2013-02-26 DIAGNOSIS — D45 Polycythemia vera: Secondary | ICD-10-CM

## 2013-02-26 DIAGNOSIS — Z79899 Other long term (current) drug therapy: Secondary | ICD-10-CM

## 2013-02-26 DIAGNOSIS — I10 Essential (primary) hypertension: Secondary | ICD-10-CM

## 2013-02-26 DIAGNOSIS — D751 Secondary polycythemia: Secondary | ICD-10-CM

## 2013-02-26 DIAGNOSIS — I82409 Acute embolism and thrombosis of unspecified deep veins of unspecified lower extremity: Secondary | ICD-10-CM

## 2013-02-26 NOTE — Assessment & Plan Note (Signed)
Patient is not having symptoms of congestive heart failure.  No palpitations.  No dizziness or syncope.

## 2013-02-26 NOTE — Progress Notes (Signed)
Patrick Sosa Date of Birth:  08-03-1945 [facility] [address] [place], [place]  [address] [phone]  Fax   [phone]  HPI: This pleasant 67 year old gentleman is seen for a scheduled followup office visit. He has a history of essential hypertension and a history of previous deep vein thrombosis. He has a history of high cholesterol. He has a history of polycythemia. His last visit he has been feeling well. He has been giving blood for help with his polycythemia.  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  He has not taken any more long trips since that last episode    Current Outpatient Prescriptions  Medication Sig Dispense Refill  . amLODipine (NORVASC) 5 MG tablet Take 1 tablet (5 mg total) by mouth daily.  90 tablet  3  . dimenhyDRINATE (DRAMAMINE) 50 MG tablet Take 75 mg by mouth at bedtime as needed. For sleep       . metoprolol tartrate (LOPRESSOR) 25 MG tablet TAKE 1 TABLET TWICE DAILY  180 tablet  3  . pravastatin (PRAVACHOL) 20 MG tablet Take 1 tablet (20 mg total) by mouth daily.  90 tablet  3  . Rivaroxaban (XARELTO) 20 MG TABS Take 1 tablet (20 mg total) by mouth daily.  30 tablet  11  . spironolactone (ALDACTONE) 25 MG tablet TAKE 1 TABLET BY MOUTH EVERY DAY  90 tablet  3   No current facility-administered medications for this visit.    No Known Allergies  Patient Active Problem List   Diagnosis Date Noted  . Hypertension     Priority: Medium  . Polycythemia     Priority: Medium  . DVT 08/15/2009    Priority: Medium  . HYPERTENSION 08/23/2007    Priority: Medium  . DVT (deep venous thrombosis)   . Hypercholesterolemia   . Dry cough   . 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 Former Smoker  . Quit date: 10/12/1987  Smokeless tobacco  . Never Used    History  Alcohol Use  . Yes   Comment: rarely    Family History  Problem Relation Age of Onset  . Alzheimer's disease Mother   . Pancreatic cancer Father   . Hypertension Father   . Heart attack Father   . Cancer Father   . Hypertension Sister     Review of Systems: The patient denies any heat or cold intolerance.  No weight gain or weight loss.  The patient denies headaches or blurry vision.  There is no cough or sputum production.  The patient denies dizziness.  There is no hematuria or hematochezia.  The patient denies any muscle aches or arthritis.  The patient denies any rash.  The patient denies frequent falling or instability.  There is no history of depression or anxiety.  All other systems were reviewed and are negative.   Physical Exam: Filed Vitals:   02/26/13 1001  BP: 144/82  Pulse: 66   the general appearance reveals a well-developed well-nourished gentleman in no distress.The head and neck exam reveals pupils equal and reactive.  Extraocular movements are full.  There is no scleral icterus.  The mouth and pharynx are normal.  The neck is supple.  The carotids reveal no bruits.  The jugular venous pressure is normal.  The  thyroid is not enlarged.  There  is no lymphadenopathy.  The chest is clear to percussion and auscultation.  There are no rales or rhonchi.  Expansion of the chest is symmetrical.  The precordium is quiet.  The first heart sound is normal.  The second heart sound is physiologically split.  There is no murmur gallop rub or click.  There is no abnormal lift or heave.  The abdomen is soft and nontender.  The bowel sounds are normal.  The liver and spleen are not enlarged.  There are no abdominal masses.  There are no abdominal bruits.  Extremities reveal good pedal pulses.  There is no phlebitis or edema.  There is no cyanosis or clubbing.  Strength is normal and symmetrical in all extremities.  There is no lateralizing weakness.  There are no sensory deficits.  The skin is warm and dry.  There  is no rash.   EKG today shows normal sinus rhythm and incomplete right bundle branch block and is unchanged since 04/27/11   Assessment / Plan: The patient is doing well.  Continue same medication.  Recheck in 6 months for office visit CBC lipid panel hepatic function panel and basal metabolic panel

## 2013-02-26 NOTE — Patient Instructions (Signed)
Your physician recommends that you continue on your current medications as directed. Please refer to the Current Medication list given to you today.  Your physician wants you to follow-up in: 6 months with fasting labs (lp/bmet/hfp/CBC) You will receive a reminder letter in the mail two months in advance. If you don't receive a letter, please call our office to schedule the follow-up appointment.  

## 2013-02-26 NOTE — Assessment & Plan Note (Signed)
The patient is doing well on Xarelto with no side effects.  Interestingly he has also noted a benefit.  He previously had a lot of dysesthesias of his feet when he would go to bed at night.  This was like a peripheral neuropathy pattern.  Since starting the Xarelto these symptoms have resolved.

## 2013-02-26 NOTE — Assessment & Plan Note (Signed)
The patient has not had to have any further phlebotomies for his polycythemia.  His hemoglobins remained stable

## 2013-04-26 ENCOUNTER — Other Ambulatory Visit: Payer: Self-pay

## 2013-07-17 ENCOUNTER — Telehealth: Payer: Self-pay | Admitting: *Deleted

## 2013-07-17 NOTE — Telephone Encounter (Signed)
Initiated PA to [employer] for [employer]

## 2013-07-17 NOTE — Telephone Encounter (Signed)
xarelto approved through 07/17/2014 by [place], [place] [id number]

## 2013-08-27 ENCOUNTER — Encounter: Payer: Self-pay | Admitting: Cardiology

## 2013-08-27 ENCOUNTER — Other Ambulatory Visit (INDEPENDENT_AMBULATORY_CARE_PROVIDER_SITE_OTHER): Payer: Medicare Other

## 2013-08-27 ENCOUNTER — Ambulatory Visit (INDEPENDENT_AMBULATORY_CARE_PROVIDER_SITE_OTHER): Payer: Medicare Other | Admitting: Cardiology

## 2013-08-27 VITALS — BP 126/80 | HR 63 | Ht 74.0 in | Wt 213.0 lb

## 2013-08-27 DIAGNOSIS — Z79899 Other long term (current) drug therapy: Secondary | ICD-10-CM

## 2013-08-27 DIAGNOSIS — D682 Hereditary deficiency of other clotting factors: Secondary | ICD-10-CM

## 2013-08-27 DIAGNOSIS — I119 Hypertensive heart disease without heart failure: Secondary | ICD-10-CM | POA: Insufficient documentation

## 2013-08-27 DIAGNOSIS — D751 Secondary polycythemia: Secondary | ICD-10-CM

## 2013-08-27 DIAGNOSIS — D45 Polycythemia vera: Secondary | ICD-10-CM

## 2013-08-27 DIAGNOSIS — E7800 Pure hypercholesterolemia, unspecified: Secondary | ICD-10-CM

## 2013-08-27 LAB — BASIC METABOLIC PANEL
BUN: 22 mg/dL (ref 6–23)
CALCIUM: 9.3 mg/dL (ref 8.4–10.5)
CO2: 27 meq/L (ref 19–32)
CREATININE: 1.5 mg/dL (ref 0.4–1.5)
Chloride: 104 mEq/L (ref 96–112)
GFR: 50.66 mL/min — AB (ref >60.00)
Glucose, Bld: 89 mg/dL (ref 70–99)
Potassium: 3.7 mEq/L (ref 3.5–5.1)
SODIUM: 138 meq/L (ref 135–145)

## 2013-08-27 LAB — HEPATIC FUNCTION PANEL
ALBUMIN: 4.3 g/dL (ref 3.5–5.2)
ALK PHOS: 47 U/L (ref 39–117)
ALT: 34 U/L (ref 0–53)
AST: 37 U/L (ref 0–37)
Bilirubin, Direct: 0.2 mg/dL (ref 0.0–0.3)
TOTAL PROTEIN: 7.7 g/dL (ref 6.0–8.3)
Total Bilirubin: 1.2 mg/dL (ref 0.3–1.2)

## 2013-08-27 LAB — CBC WITH DIFFERENTIAL/PLATELET
BASOS ABS: 0 10*3/uL (ref 0.0–0.1)
BASOS PCT: 0.3 % (ref 0.0–3.0)
EOS ABS: 0.2 10*3/uL (ref 0.0–0.7)
Eosinophils Relative: 3.6 % (ref 0.0–5.0)
HCT: 45.4 % (ref 39.0–52.0)
HEMOGLOBIN: 15.5 g/dL (ref 13.0–17.0)
LYMPHS PCT: 25.9 % (ref 12.0–46.0)
Lymphs Abs: 1.5 10*3/uL (ref 0.7–4.0)
MCHC: 34.2 g/dL (ref 30.0–36.0)
MCV: 102.6 fl — ABNORMAL HIGH (ref 78.0–100.0)
MONOS PCT: 11.6 % (ref 3.0–12.0)
Monocytes Absolute: 0.7 10*3/uL (ref 0.1–1.0)
NEUTROS ABS: 3.3 10*3/uL (ref 1.4–7.7)
NEUTROS PCT: 58.6 % (ref 43.0–77.0)
Platelets: 169 10*3/uL (ref 150.0–400.0)
RBC: 4.42 Mil/uL (ref 4.22–5.81)
RDW: 12.5 % (ref 11.5–14.6)
WBC: 5.7 10*3/uL (ref 4.5–10.5)

## 2013-08-27 LAB — LIPID PANEL
CHOLESTEROL: 119 mg/dL (ref 0–200)
HDL: 28.9 mg/dL — AB (ref >39.00)
LDL Cholesterol: 55 mg/dL (ref 0–99)
TRIGLYCERIDES: 176 mg/dL — AB (ref 0.0–149.0)
Total CHOL/HDL Ratio: 4
VLDL: 35.2 mg/dL (ref 0.0–40.0)

## 2013-08-27 NOTE — Assessment & Plan Note (Signed)
No side effects from Pravachol.

## 2013-08-27 NOTE — Assessment & Plan Note (Signed)
We are monitoring his hemoglobin every 6 months.  He has not had to have any recent phlebotomies.

## 2013-08-27 NOTE — Assessment & Plan Note (Signed)
No chest pain or shortness of breath.  No palpitations.  No dizziness or syncope.

## 2013-08-27 NOTE — Progress Notes (Signed)
[name] Date of Birth:  Sep 24, 1945 [address] [place], [place]  [address] [phone]  Fax   [phone]  HPI: This pleasant 68 year old gentleman is seen for a scheduled followup office visit. He has a history of essential hypertension and a history of previous deep vein thrombosis. He has a history of high cholesterol. He has a history of polycythemia. His last visit he has been feeling well. He has been giving blood for help with his polycythemia.  He has not had to have any recent phlebotomies.  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  He took a long trip in January and had no problems with recurrent phlebitis.    Current Outpatient Prescriptions  Medication Sig Dispense Refill  . amLODipine (NORVASC) 5 MG tablet Take 1 tablet (5 mg total) by mouth daily.  90 tablet  3  . dimenhyDRINATE (DRAMAMINE) 50 MG tablet Take 75 mg by mouth at bedtime as needed. For sleep       . metoprolol tartrate (LOPRESSOR) 25 MG tablet TAKE 1 TABLET TWICE DAILY  180 tablet  3  . pravastatin (PRAVACHOL) 20 MG tablet Take 1 tablet (20 mg total) by mouth daily.  90 tablet  3  . Rivaroxaban (XARELTO) 20 MG TABS Take 1 tablet (20 mg total) by mouth daily.  30 tablet  11  . spironolactone (ALDACTONE) 25 MG tablet TAKE 1 TABLET BY MOUTH EVERY DAY  90 tablet  3   No current facility-administered medications for this visit.    No Known Allergies  Patient Active Problem List   Diagnosis Date Noted  . Hypertension     Priority: Medium  . Polycythemia     Priority: Medium  . DVT 08/15/2009    Priority: Medium  . HYPERTENSION 08/23/2007    Priority: Medium  . DVT (deep venous thrombosis)   . Hypercholesterolemia   . Dry cough   . 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 Former  Smoker  . Quit date: 10/12/1987  Smokeless tobacco  . Never Used    History  Alcohol Use  . Yes    Comment: rarely    Family History  Problem Relation Age of Onset  . Alzheimer's disease Mother   . Pancreatic cancer Father   . Hypertension Father   . Heart attack Father   . Cancer Father   . Hypertension Sister     Review of Systems: The patient denies any heat or cold intolerance.  No weight gain or weight loss.  The patient denies headaches or blurry vision.  There is no cough or sputum production.  The patient denies dizziness.  There is no hematuria or hematochezia.  The patient denies any muscle aches or arthritis.  The patient denies any rash.  The patient denies frequent falling or instability.  There is no history of depression or anxiety.  All other systems were reviewed and are negative.   Physical Exam: Filed Vitals:   08/27/13 0752  BP: 126/80  Pulse: 63   the general appearance reveals a well-developed well-nourished gentleman in no distress.The head and neck exam reveals pupils equal and reactive.  Extraocular movements are full.  There is no scleral icterus.  The mouth and pharynx are normal.  The neck is  supple.  The carotids reveal no bruits.  The jugular venous pressure is normal.  The  thyroid is not enlarged.  There is no lymphadenopathy.  The chest is clear to percussion and auscultation.  There are no rales or rhonchi.  Expansion of the chest is symmetrical.  The precordium is quiet.  The first heart sound is normal.  The second heart sound is physiologically split.  There is no murmur gallop rub or click.  There is no abnormal lift or heave.  The abdomen is soft and nontender.  The bowel sounds are normal.  The liver and spleen are not enlarged.  There are no abdominal masses.  There are no abdominal bruits.  Extremities reveal good pedal pulses.  There is no phlebitis or edema.  There is no cyanosis or clubbing.  Strength is normal and symmetrical in all  extremities.  There is no lateralizing weakness.  There are no sensory deficits.  The skin is warm and dry.  There is no rash.      Assessment / Plan: The patient is doing well.  Continue same medication.  Recheck in 6 months for office visit CBC lipid panel hepatic function panel and basal metabolic panel

## 2013-08-27 NOTE — Patient Instructions (Signed)
Your physician recommends that you continue on your current medications as directed. Please refer to the Current Medication list given to you today.  Your physician wants you to follow-up in: 6 months with fasting labs (lp/bmet/hfp/cbc) You will receive a reminder letter in the mail two months in advance. If you don't receive a letter, please call our office to schedule the follow-up appointment.  

## 2013-08-27 NOTE — Assessment & Plan Note (Signed)
No symptoms of recurrent DVT.

## 2013-08-27 NOTE — Progress Notes (Signed)
Quick Note:  Please report to patient. The recent labs are stable. Continue same medication and careful diet. Kidney function not as good. Drink more water. ______

## 2013-08-29 ENCOUNTER — Telehealth: Payer: Self-pay | Admitting: *Deleted

## 2013-08-29 NOTE — Telephone Encounter (Signed)
Message copied by Earvin Hansen on Wed Aug 29, 2013 10:09 AM ------      Message from: Darlin Coco      Created: Mon Aug 27, 2013  8:02 PM       Please report to patient.  The recent labs are stable. Continue same medication and careful diet. Kidney function not as good. Drink more water. ------

## 2013-08-29 NOTE — Telephone Encounter (Signed)
Mailed copy of labs and left message to call if any questions  

## 2013-09-03 ENCOUNTER — Other Ambulatory Visit: Payer: Self-pay | Admitting: Cardiology

## 2013-10-29 ENCOUNTER — Other Ambulatory Visit: Payer: Self-pay | Admitting: Cardiology

## 2013-11-06 ENCOUNTER — Telehealth: Payer: Self-pay | Admitting: Internal Medicine

## 2013-11-06 NOTE — Telephone Encounter (Signed)
Pt would like to switch to dr Burnice Logan as new pcp. Pt needs wellness check up.

## 2013-11-06 NOTE — Telephone Encounter (Signed)
ok 

## 2013-11-06 NOTE — Telephone Encounter (Signed)
Pt has been sch

## 2013-11-26 ENCOUNTER — Other Ambulatory Visit: Payer: Self-pay | Admitting: Cardiology

## 2013-12-28 ENCOUNTER — Other Ambulatory Visit: Payer: Self-pay | Admitting: Cardiology

## 2014-01-13 ENCOUNTER — Other Ambulatory Visit: Payer: Self-pay | Admitting: Cardiology

## 2014-01-14 ENCOUNTER — Emergency Department (INDEPENDENT_AMBULATORY_CARE_PROVIDER_SITE_OTHER)
Admission: EM | Admit: 2014-01-14 | Discharge: 2014-01-14 | Disposition: A | Discharge status: Home / Self Care | Payer: Medicare Other | Source: Home / Self Care | Attending: Emergency Medicine | Admitting: Emergency Medicine

## 2014-01-14 ENCOUNTER — Encounter (HOSPITAL_COMMUNITY): Payer: Self-pay | Admitting: Emergency Medicine

## 2014-01-14 DIAGNOSIS — R05 Cough: Secondary | ICD-10-CM

## 2014-01-14 DIAGNOSIS — R059 Cough, unspecified: Secondary | ICD-10-CM

## 2014-01-14 MED ORDER — OMEPRAZOLE 20 MG PO CPDR: 20.0000 mg | DELAYED_RELEASE_CAPSULE | Freq: Every day | ORAL | Status: DC

## 2014-01-14 MED ORDER — DESLORATADINE 5 MG PO TABS: 5.0000 mg | ORAL_TABLET | Freq: Every day | ORAL | Status: DC

## 2014-01-14 NOTE — ED Provider Notes (Signed)
CSN: [id number]     Arrival date & time 01/14/14  3009 History   First MD Initiated Contact with Patient 01/14/14 [phone]     Chief Complaint  Patient presents with  . Cough   (Consider location/radiation/quality/duration/timing/severity/associated sxs/prior Treatment) HPI He is here today for evaluation of cough. He reports having a cough for the last month. It is associated with some mild nasal congestion and sore throat. He denies fevers or shortness of breath. [facility] 10 [facility], and prescribed a five-day course of antibiotics. He states the antibiotic seemed to help a little bit, but he continues to have cough. The cough is worse at night when he lays flat. He also describes a little bit of wheezing at night. He denies any nausea, vomiting, abdominal pain. No chest pain. He is a former smoker, he quit 30+ years ago.  Past Medical History  Diagnosis Date  . Hypertension   . Polycythemia   . DVT (deep venous thrombosis)   . Hypercholesterolemia   . Dry cough   . DVT (deep venous thrombosis)    Past Surgical History  Procedure Laterality Date  . Tonsillectomy     Family History  Problem Relation Age of Onset  . Alzheimer's disease Mother   . Pancreatic cancer Father   . Hypertension Father   . Heart attack Father   . Cancer Father   . Hypertension Sister    History  Substance Use Topics  . Smoking status: Former Smoker    Quit date: 10/12/1987  . Smokeless tobacco: Never Used  . Alcohol Use: Yes     Comment: rarely    Review of Systems  Constitutional: Negative for fever and chills.  HENT: Positive for congestion and sore throat. Negative for rhinorrhea and voice change.   Eyes: Negative.   Respiratory: Positive for cough and wheezing. Negative for chest tightness and shortness of breath.   Cardiovascular: Negative.   Gastrointestinal: Negative.     Allergies  Review of patient's allergies indicates no known allergies.  Home  Medications   Prior to Admission medications   Medication Sig Start Date End Date Taking? Authorizing Provider  amLODipine (NORVASC) 5 MG tablet Take 1 tablet (5 mg total) by mouth daily. 01/15/13 01/15/14  Darlin Coco, MD  amLODipine (NORVASC) 5 MG tablet TAKE 1 TABLET BY MOUTH DAILY 11/26/13   Darlin Coco, MD  desloratadine (CLARINEX) 5 MG tablet Take 1 tablet (5 mg total) by mouth daily. 01/14/14   Melony Overly, MD  dimenhyDRINATE (DRAMAMINE) 50 MG tablet Take 75 mg by mouth at bedtime as needed. For sleep     Historical Provider, MD  metoprolol tartrate (LOPRESSOR) 25 MG tablet TAKE 1 TABLET BY MOUTH TWICE DAILY 09/03/13   Darlin Coco, MD  omeprazole (PRILOSEC) 20 MG capsule Take 1 capsule (20 mg total) by mouth daily. 01/14/14   Melony Overly, MD  pravastatin (PRAVACHOL) 20 MG tablet Take 1 tablet (20 mg total) by mouth daily. 01/15/13   Darlin Coco, MD  pravastatin (PRAVACHOL) 20 MG tablet TAKE 1 TABLET BY MOUTH DAILY 12/28/13   Darlin Coco, MD  Rivaroxaban (XARELTO) 20 MG TABS Take 1 tablet (20 mg total) by mouth daily. 01/09/13   Darlin Coco, MD  spironolactone (ALDACTONE) 25 MG tablet TAKE 1 TABLET BY MOUTH DAILY 09/03/13   Darlin Coco, MD   BP 150/81  Pulse 68  Temp(Src) 97.8 F (36.6 C) (Oral)  Resp 18  SpO2 96% Physical  Exam  Constitutional: He is oriented to person, place, and time. He appears well-developed and well-nourished. No distress.  HENT:  Head: Normocephalic and atraumatic.  Right Ear: External ear normal.  Left Ear: External ear normal.  Mouth/Throat: Oropharynx is clear and moist. No oropharyngeal exudate.  Eyes: Conjunctivae are normal. Right eye exhibits no discharge. Left eye exhibits no discharge.  Neck: Normal range of motion. Neck supple.  Cardiovascular: Normal rate, regular rhythm and normal heart sounds.  Exam reveals no friction rub.   No murmur heard. Pulmonary/Chest: Effort normal and breath sounds normal. No respiratory  distress. He has no wheezes. He has no rales.  Neurological: He is alert and oriented to person, place, and time.  Skin: Skin is warm and dry. No rash noted.    ED Course  Procedures (including critical care time) Labs Review Labs Reviewed - No data to display  Imaging Review No results found.   MDM   1. Cough    His exam is normal. I suspect this cough is related more to allergies or some silent reflux. Discussed with the patient that I doubt the continuing cough is due to infection at this point. Will treat with Clarinex and omeprazole. Recommended followup with his PCP in 1-2 weeks.    Melony Overly, MD 01/14/14 [phone]

## 2014-01-14 NOTE — ED Notes (Signed)
Pt  Reports  Symptoms  Of  Cough  /  Congested         X  4  Weeks    Seen       Last  [facility]  At  [facility]       Completed  A  Course  Of  Anti  Biotics    -  Pt     Reports  Still having uri  Symptoms

## 2014-01-14 NOTE — Discharge Instructions (Signed)
I think your cough may be coming from allergies or reflux. There is no signs of pneumonia or infection on your exam. Take the allergy pill and acid blocker daily. I think you will start to see some improvement in the next week.  Follow up with your regular doctor in the next 2 weeks.

## 2014-01-18 ENCOUNTER — Encounter: Payer: Medicare Other | Admitting: Internal Medicine

## 2014-01-21 ENCOUNTER — Other Ambulatory Visit: Payer: Medicare Other

## 2014-01-28 ENCOUNTER — Ambulatory Visit (INDEPENDENT_AMBULATORY_CARE_PROVIDER_SITE_OTHER): Payer: Medicare Other | Admitting: Internal Medicine

## 2014-01-28 ENCOUNTER — Encounter: Payer: Self-pay | Admitting: Internal Medicine

## 2014-01-28 VITALS — BP 120/70 | HR 66 | Temp 98.0°F | Resp 20 | Ht 73.0 in | Wt 213.0 lb

## 2014-01-28 DIAGNOSIS — E7800 Pure hypercholesterolemia, unspecified: Secondary | ICD-10-CM

## 2014-01-28 DIAGNOSIS — D751 Secondary polycythemia: Secondary | ICD-10-CM

## 2014-01-28 DIAGNOSIS — D682 Hereditary deficiency of other clotting factors: Secondary | ICD-10-CM

## 2014-01-28 DIAGNOSIS — I10 Essential (primary) hypertension: Secondary | ICD-10-CM

## 2014-01-28 DIAGNOSIS — Z0000 Encounter for general adult medical examination without abnormal findings: Secondary | ICD-10-CM

## 2014-01-28 DIAGNOSIS — D45 Polycythemia vera: Secondary | ICD-10-CM

## 2014-01-28 NOTE — Progress Notes (Signed)
Subjective:    Patient ID: Patrick Sosa, male    DOB: 07/14/1945, 68 y.o.   MRN: [id number]  HPI  68 year old patient, retired Engineer, maintenance ([employer]), who is in today for his annual exam.  Medical problems include history of hypertension, dyslipidemia, and polycythemia.  He has a history recurrent DVT with factor V deficiency.  Remains on chronic anticoagulation.  No concerns or complaints.  Recent laboratory studies reviewed.  He did have screening colonoscopy in 2010 that revealed  significant diverticular disease, as well as internal hemorrhoids  Past Medical History  Diagnosis Date  . Hypertension   . Polycythemia   . DVT (deep venous thrombosis)   . Hypercholesterolemia   . Dry cough   . DVT (deep venous thrombosis)     History   Social History  . Marital Status: Married    Spouse Name: N/A    Number of Children: N/A  . Years of Education: N/A   Occupational History  . Not on file.   Social History Main Topics  . Smoking status: Former Smoker    Quit date: 10/12/1987  . Smokeless tobacco: Never Used  . Alcohol Use: Yes     Comment: rarely  . Drug Use: No  . Sexual Activity: Yes   Other Topics Concern  . Not on file   Social History Narrative  . No narrative on file    Past Surgical History  Procedure Laterality Date  . Tonsillectomy      Family History  Problem Relation Age of Onset  . Alzheimer's disease Mother   . Pancreatic cancer Father   . Hypertension Father   . Heart attack Father   . Cancer Father   . Hypertension Sister     No Known Allergies  Current Outpatient Prescriptions on File Prior to Visit  Medication Sig Dispense Refill  . amLODipine (NORVASC) 5 MG tablet TAKE 1 TABLET BY MOUTH DAILY  30 tablet  3  . dimenhyDRINATE (DRAMAMINE) 50 MG tablet Take 75 mg by mouth at bedtime as needed. For sleep       . metoprolol tartrate (LOPRESSOR) 25 MG tablet TAKE 1 TABLET BY MOUTH TWICE DAILY  180 tablet  3  . pravastatin (PRAVACHOL) 20 MG tablet Take 1  tablet (20 mg total) by mouth daily.  90 tablet  3  . pravastatin (PRAVACHOL) 20 MG tablet TAKE 1 TABLET BY MOUTH DAILY  90 tablet  0  . spironolactone (ALDACTONE) 25 MG tablet TAKE 1 TABLET BY MOUTH DAILY  90 tablet  3  . XARELTO 20 MG TABS tablet TAKE 1 TABLET BY MOUTH EVERY DAY  180 tablet  1   No current facility-administered medications on file prior to visit.    BP 120/70  Pulse 66  Temp(Src) 98 F (36.7 C) (Oral)  Resp 20  Ht 6\' 1"  (1.854 m)  Wt 213 lb (96.616 kg)  BMI 28.11 kg/m2  SpO2 98%   1. Risk factors, based on past  M,S,F history.  Risk factors include hypertension, and dyslipidemia.  He has remote tobacco history, but discontinued at age 64.  He has a history of polycythemia  2.  Physical activities: No exercise limitation.  Walks his dog daily  3.  Depression/mood: No history depression or mood disorder  4.  Hearing: No deficits  5.  ADL's: Independent in all aspects of daily living  6.  Fall risk: Low  7.  Home safety: No problems identified  8.  Height weight, and visual acuity;  height and weight stable.  Wears glasses.  Does obtain an annual eye, examination.  Each November  9.  Counseling: Heart healthy diet regular exercise encouraged  10. Lab orders based on risk factors: Laboratory profile including lipid panel reviewed  11. Referral : Not appropriate at this time  12. Care plan: Continue heart healthy diet regular exercise.  Patient appears to be near ideal body weight  13. Cognitive assessment: Alert and oriented with normal affect.  No cognitive dysfunction  14. Screening: Eye examination in November.  Repeat colonoscopy 2020  15. Provider List Update: Cardiology and GI as well as ophthalmology    Review of Systems  Constitutional: Negative for fever, chills, activity change, appetite change and fatigue.  HENT: Negative for congestion, dental problem, ear pain, hearing loss, mouth sores, rhinorrhea, sinus pressure, sneezing, tinnitus,  trouble swallowing and voice change.   Eyes: Negative for photophobia, pain, redness and visual disturbance.  Respiratory: Positive for cough. Negative for apnea, choking, chest tightness, shortness of breath and wheezing.   Cardiovascular: Negative for chest pain, palpitations and leg swelling.  Gastrointestinal: Negative for nausea, vomiting, abdominal pain, diarrhea, constipation, blood in stool, abdominal distention, anal bleeding and rectal pain.  Genitourinary: Negative for dysuria, urgency, frequency, hematuria, flank pain, decreased urine volume, discharge, penile swelling, scrotal swelling, difficulty urinating, genital sores and testicular pain.  Musculoskeletal: Negative for arthralgias, back pain, gait problem, joint swelling, myalgias, neck pain and neck stiffness.  Skin: Negative for color change, rash and wound.  Neurological: Negative for dizziness, tremors, seizures, syncope, facial asymmetry, speech difficulty, weakness, light-headedness, numbness and headaches.  Hematological: Negative for adenopathy. Does not bruise/bleed easily.  Psychiatric/Behavioral: Negative for suicidal ideas, hallucinations, behavioral problems, confusion, sleep disturbance, self-injury, dysphoric mood, decreased concentration and agitation. The patient is not nervous/anxious.        Objective:   Physical Exam  Constitutional: He appears well-developed and well-nourished.  HENT:  Head: Normocephalic and atraumatic.  Right Ear: External ear normal.  Left Ear: External ear normal.  Nose: Nose normal.  Mouth/Throat: Oropharynx is clear and moist.  Eyes: Conjunctivae and EOM are normal. Pupils are equal, round, and reactive to light. No scleral icterus.  Neck: Normal range of motion. Neck supple. No JVD present. No thyromegaly present.  Cardiovascular: Regular rhythm, normal heart sounds and intact distal pulses.  Exam reveals no gallop and no friction rub.   No murmur heard. Pedal pulses slightly  diminished on the left  Pulmonary/Chest: Effort normal. He exhibits no tenderness.  A few crackles, right base  Abdominal: Soft. Bowel sounds are normal. He exhibits no distension and no mass. There is no tenderness.  Genitourinary: Penis normal. Guaiac negative stool.  Prostatic enlargement  Musculoskeletal: Normal range of motion. He exhibits no edema and no tenderness.  Lymphadenopathy:    He has no cervical adenopathy.  Neurological: He is alert. He has normal reflexes. No cranial nerve deficit. Coordination normal.  Skin: Skin is warm and dry. No rash noted.  Psychiatric: He has a normal mood and affect. His behavior is normal.          Assessment & Plan:   Preventive health examination. Hypertension.  Well controlled on present regimen.  No change Dyslipidemia.  Continue statin therapy Recurrent DVT/factor V Leiden deficiency.  Continue chronic anticoagulation.  Patient may have to switch that to come in and did 2 proximal concerns History of polycythemia Post viral cough.  We'll continue to observe cough is improving  Recheck one year

## 2014-01-28 NOTE — Patient Instructions (Signed)
Limit your sodium (Salt) intake    It is important that you exercise regularly, at least 20 minutes 3 to 4 times per week.  If you develop chest pain or shortness of breath seek  medical attention.  Please check your blood pressure on a regular basis.  If it is consistently greater than 150/90, please make an office appointment.  Health Maintenance A healthy lifestyle and preventative care can promote health and wellness.  Maintain regular health, dental, and eye exams.  Eat a healthy diet. Foods like vegetables, fruits, whole grains, low-fat dairy products, and lean protein foods contain the nutrients you need and are low in calories. Decrease your intake of foods high in solid fats, added sugars, and salt. Get information about a proper diet from your health care provider, if necessary.  Regular physical exercise is one of the most important things you can do for your health. Most adults should get at least 150 minutes of moderate-intensity exercise (any activity that increases your heart rate and causes you to sweat) each week. In addition, most adults need muscle-strengthening exercises on 2 or more days a week.   Maintain a healthy weight. The body mass index (BMI) is a screening tool to identify possible weight problems. It provides an estimate of body fat based on height and weight. Your health care provider can find your BMI and can help you achieve or maintain a healthy weight. For males 20 years and older:  A BMI below 18.5 is considered underweight.  A BMI of 18.5 to 24.9 is normal.  A BMI of 25 to 29.9 is considered overweight.  A BMI of 30 and above is considered obese.  Maintain normal blood lipids and cholesterol by exercising and minimizing your intake of saturated fat. Eat a balanced diet with plenty of fruits and vegetables. Blood tests for lipids and cholesterol should begin at age 28 and be repeated every 5 years. If your lipid or cholesterol levels are high, you are  over age 19, or you are at high risk for heart disease, you may need your cholesterol levels checked more frequently.Ongoing high lipid and cholesterol levels should be treated with medicines if diet and exercise are not working.  If you smoke, find out from your health care provider how to quit. If you do not use tobacco, do not start.  Lung cancer screening is recommended for adults aged 60-80 years who are at high risk for developing lung cancer because of a history of smoking. A yearly low-dose CT scan of the lungs is recommended for people who have at least a 30-pack-year history of smoking and are current smokers or have quit within the past 15 years. A pack year of smoking is smoking an average of 1 pack of cigarettes a day for 1 year (for example, a 30-pack-year history of smoking could mean smoking 1 pack a day for 30 years or 2 packs a day for 15 years). Yearly screening should continue until the smoker has stopped smoking for at least 15 years. Yearly screening should be stopped for people who develop a health problem that would prevent them from having lung cancer treatment.  If you choose to drink alcohol, do not have more than 2 drinks per day. One drink is considered to be 12 oz (360 mL) of beer, 5 oz (150 mL) of wine, or 1.5 oz (45 mL) of liquor.  Avoid the use of street drugs. Do not share needles with anyone. Ask for help if you  need support or instructions about stopping the use of drugs.  High blood pressure causes heart disease and increases the risk of stroke. Blood pressure should be checked at least every 1-2 years. Ongoing high blood pressure should be treated with medicines if weight loss and exercise are not effective.  If you are 71-23 years old, ask your health care provider if you should take aspirin to prevent heart disease.  Diabetes screening involves taking a blood sample to check your fasting blood sugar level. This should be done once every 3 years after age 68 if  you are at a normal weight and without risk factors for diabetes. Testing should be considered at a younger age or be carried out more frequently if you are overweight and have at least 1 risk factor for diabetes.  Colorectal cancer can be detected and often prevented. Most routine colorectal cancer screening begins at the age of 35 and continues through age 66. However, your health care provider may recommend screening at an earlier age if you have risk factors for colon cancer. On a yearly basis, your health care provider may provide home test kits to check for hidden blood in the stool. A small camera at the end of a tube may be used to directly examine the colon (sigmoidoscopy or colonoscopy) to detect the earliest forms of colorectal cancer. Talk to your health care provider about this at age 57 when routine screening begins. A direct exam of the colon should be repeated every 5-10 years through age 57, unless early forms of precancerous polyps or small growths are found.  People who are at an increased risk for hepatitis B should be screened for this virus. You are considered at high risk for hepatitis B if:  You were born in a country where hepatitis B occurs often. Talk with your health care provider about which countries are considered high risk.  Your parents were born in a high-risk country and you have not received a shot to protect against hepatitis B (hepatitis B vaccine).  You have HIV or AIDS.  You use needles to inject street drugs.  You live with, or have sex with, someone who has hepatitis B.  You are a man who has sex with other men (MSM).  You get hemodialysis treatment.  You take certain medicines for conditions like cancer, organ transplantation, and autoimmune conditions.  Hepatitis C blood testing is recommended for all people born from [birth year] through 1963 and any individual with known risk factors for hepatitis C.  Healthy men should no longer receive prostate-specific  antigen (PSA) blood tests as part of routine cancer screening. Talk to your health care provider about prostate cancer screening.  Testicular cancer screening is not recommended for adolescents or adult males who have no symptoms. Screening includes self-exam, a health care provider exam, and other screening tests. Consult with your health care provider about any symptoms you have or any concerns you have about testicular cancer.  Practice safe sex. Use condoms and avoid high-risk sexual practices to reduce the spread of sexually transmitted infections (STIs).  You should be screened for STIs, including gonorrhea and chlamydia if:  You are sexually active and are younger than 24 years.  You are older than 24 years, and your health care provider tells you that you are at risk for this type of infection.  Your sexual activity has changed since you were last screened, and you are at an increased risk for chlamydia or gonorrhea. Ask your health  care provider if you are at risk.  If you are at risk of being infected with HIV, it is recommended that you take a prescription medicine daily to prevent HIV infection. This is called pre-exposure prophylaxis (PrEP). You are considered at risk if:  You are a man who has sex with other men (MSM).  You are a heterosexual man who is sexually active with multiple partners.  You take drugs by injection.  You are sexually active with a partner who has HIV.  Talk with your health care provider about whether you are at high risk of being infected with HIV. If you choose to begin PrEP, you should first be tested for HIV. You should then be tested every 3 months for as long as you are taking PrEP.  Use sunscreen. Apply sunscreen liberally and repeatedly throughout the day. You should seek shade when your shadow is shorter than you. Protect yourself by wearing long sleeves, pants, a wide-brimmed hat, and sunglasses year round whenever you are outdoors.  Tell  your health care provider of new moles or changes in moles, especially if there is a change in shape or color. Also, tell your health care provider if a mole is larger than the size of a pencil eraser.  A one-time screening for abdominal aortic aneurysm (AAA) and surgical repair of large AAAs by ultrasound is recommended for men aged 4-75 years who are current or former smokers.  Stay current with your vaccines (immunizations). Document Released: 12/04/2007 Document Revised: 06/12/2013 Document Reviewed: 11/02/2010 [facility] Patient Information 2015 [place], [place]. This information is not intended to replace advice given to you by your health care provider. Make sure you discuss any questions you have with your health care provider. Cardiac Diet This diet can help prevent heart disease and stroke. Many factors influence your heart health, including eating and exercise habits. Coronary risk rises a lot with abnormal blood fat (lipid) levels. Cardiac meal planning includes limiting unhealthy fats, increasing healthy fats, and making other small dietary changes. General guidelines are as follows:  Adjust calorie intake to reach and maintain desirable body weight.  Limit total fat intake to less than 30% of total calories. Saturated fat should be less than 7% of calories.  Saturated fats are found in animal products and in some vegetable products. Saturated vegetable fats are found in coconut oil, cocoa butter, palm oil, and palm kernel oil. Read labels carefully to avoid these products as much as possible. Use butter in moderation. Choose tub margarines and oils that have 2 grams of fat or less. Good cooking oils are canola and olive oils.  Practice low-fat cooking techniques. Do not fry food. Instead, broil, bake, boil, steam, grill, roast on a rack, stir-fry, or microwave it. Other fat reducing suggestions include:  Remove the skin from poultry.  Remove all visible fat from meats.  Skim the fat  off stews, soups, and gravies before serving them.  Steam vegetables in water or broth instead of sauting them in fat.  Avoid foods with trans fat (or hydrogenated oils), such as commercially fried foods and commercially baked goods. Commercial shortening and deep-frying fats will contain trans fat.  Increase intake of fruits, vegetables, whole grains, and legumes to replace foods high in fat.  Increase consumption of nuts, legumes, and seeds to at least 4 servings weekly. One serving of a legume equals  cup, and 1 serving of nuts or seeds equals  cup.  Choose whole grains more often. Have 3 servings per  day (a serving is 1 ounce [oz]).  Eat 4 to 5 servings of vegetables per day. A serving of vegetables is 1 cup of raw leafy vegetables;  cup of raw or cooked cut-up vegetables;  cup of vegetable juice.  Eat 4 to 5 servings of fruit per day. A serving of fruit is 1 medium whole fruit;  cup of dried fruit;  cup of fresh, frozen, or canned fruit;  cup of 100% fruit juice.  Increase your intake of dietary fiber to 20 to 30 grams per day. Insoluble fiber may help lower your risk of heart disease and may help curb your appetite.  Soluble fiber binds cholesterol to be removed from the blood. Foods high in soluble fiber are dried beans, citrus fruits, oats, apples, bananas, broccoli, Brussels sprouts, and eggplant.  Try to include foods fortified with plant sterols or stanols, such as yogurt, breads, juices, or margarines. Choose several fortified foods to achieve a daily intake of 2 to 3 grams of plant sterols or stanols.  Foods with omega-3 fats can help reduce your risk of heart disease. Aim to have a 3.5 oz portion of fatty fish twice per week, such as salmon, mackerel, albacore tuna, sardines, lake trout, or herring. If you wish to take a fish oil supplement, choose one that contains 1 gram of both DHA and EPA.  Limit processed meats to 2 servings (3 oz portion) weekly.  Limit the sodium  in your diet to 1500 milligrams (mg) per day. If you have high blood pressure, talk to a registered dietitian about a DASH (Dietary Approaches to Stop Hypertension) eating plan.  Limit sweets and beverages with added sugar, such as soda, to no more than 5 servings per week. One serving is:   1 tablespoon sugar.  1 tablespoon jelly or jam.   cup sorbet.  1 cup lemonade.   cup regular soda. CHOOSING FOODS Starches  Allowed: Breads: All kinds (wheat, rye, raisin, white, oatmeal, [place], [place], and English muffin bread). Low-fat rolls: English muffins, frankfurter and hamburger buns, bagels, pita bread, tortillas (not fried). Pancakes, waffles, biscuits, and muffins made with recommended oil.  Avoid: Products made with saturated or trans fats, oils, or whole milk products. Butter rolls, cheese breads, croissants. Commercial doughnuts, muffins, sweet rolls, biscuits, waffles, pancakes, store-bought mixes. Crackers  Allowed: Low-fat crackers and snacks: Animal, graham, rye, saltine (with recommended oil, no lard), oyster, and matzo crackers. Bread sticks, melba toast, rusks, flatbread, pretzels, and light popcorn.  Avoid: High-fat crackers: cheese crackers, butter crackers, and those made with coconut, palm oil, or trans fat (hydrogenated oils). Buttered popcorn. Cereals  Allowed: Hot or cold whole-grain cereals.  Avoid: Cereals containing coconut, hydrogenated vegetable fat, or animal fat. Potatoes / Pasta / Rice  Allowed: All kinds of potatoes, rice, and pasta (such as macaroni, spaghetti, and noodles).  Avoid: Pasta or rice prepared with cream sauce or high-fat cheese. Chow mein noodles, [place] fries. Vegetables  Allowed: All vegetables and vegetable juices.  Avoid: Fried vegetables. Vegetables in cream, butter, or high-fat cheese sauces. Limit coconut. Fruit in cream or custard. Protein  Allowed: Limit your intake of meat, seafood, and poultry to no more than 6 oz (cooked  weight) per day. All lean, well-trimmed beef, veal, pork, and lamb. All chicken and [place] without skin. All fish and shellfish. Wild game: wild duck, rabbit, pheasant, and venison. Egg whites or low-cholesterol egg substitutes may be used as desired. Meatless dishes: recipes with dried beans, peas, lentils, and tofu (soybean  curd). Seeds and nuts: all seeds and most nuts.  Avoid: Prime grade and other heavily marbled and fatty meats, such as short ribs, spare ribs, rib eye roast or steak, frankfurters, sausage, bacon, and high-fat luncheon meats, mutton. Caviar. Commercially fried fish. Domestic duck, goose, venison sausage. Organ meats: liver, gizzard, heart, chitterlings, brains, kidney, sweetbreads. Dairy  Allowed: Low-fat cheeses: nonfat or low-fat cottage cheese (1% or 2% fat), cheeses made with part skim milk, such as mozzarella, farmers, string, or ricotta. (Cheeses should be labeled no more than 2 to 6 grams fat per oz.). Skim (or 1%) milk: liquid, powdered, or evaporated. Buttermilk made with low-fat milk. Drinks made with skim or low-fat milk or cocoa. Chocolate milk or cocoa made with skim or low-fat (1%) milk. Nonfat or low-fat yogurt.  Avoid: Whole milk cheeses, including colby, cheddar, mu[place], Monterey Jack, [place], [place], [place], American, Swiss, and blue. Creamed cottage cheese, cream cheese. Whole milk and whole milk products, including buttermilk or yogurt made from whole milk, drinks made from whole milk. Condensed milk, evaporated whole milk, and 2% milk. Soups and Combination Foods  Allowed: Low-fat low-sodium soups: broth, dehydrated soups, homemade broth, soups with the fat removed, homemade cream soups made with skim or low-fat milk. Low-fat spaghetti, lasagna, chili, and Spanish rice if low-fat ingredients and low-fat cooking techniques are used.  Avoid: Cream soups made with whole milk, cream, or high-fat cheese. All other soups. Desserts and Sweets  Allowed: Sherbet,  fruit ices, gelatins, meringues, and angel food cake. Homemade desserts with recommended fats, oils, and milk products. Jam, jelly, honey, marmalade, sugars, and syrups. Pure sugar candy, such as gum drops, hard candy, jelly beans, marshmallows, mints, and small amounts of dark chocolate.  Avoid: Commercially prepared cakes, pies, cookies, frosting, pudding, or mixes for these products. Desserts containing whole milk products, chocolate, coconut, lard, palm oil, or palm kernel oil. Ice cream or ice cream drinks. Candy that contains chocolate, coconut, butter, hydrogenated fat, or unknown ingredients. Buttered syrups. Fats and Oils  Allowed: Vegetable oils: safflower, sunflower, corn, soybean, cottonseed, sesame, canola, olive, or peanut. Non-hydrogenated margarines. Salad dressing or mayonnaise: homemade or commercial, made with a recommended oil. Low or nonfat salad dressing or mayonnaise.  Limit added fats and oils to 6 to 8 tsp per day (includes fats used in cooking, baking, salads, and spreads on bread). Remember to count the "hidden fats" in foods.  Avoid: Solid fats and shortenings: butter, lard, salt pork, bacon drippings. Gravy containing meat fat, shortening, or suet. Cocoa butter, coconut. Coconut oil, palm oil, palm kernel oil, or hydrogenated oils: these ingredients are often used in bakery products, nondairy creamers, whipped toppings, candy, and commercially fried foods. Read labels carefully. Salad dressings made of unknown oils, sour cream, or cheese, such as blue cheese and Roquefort. Cream, all kinds: half-and-half, light, heavy, or whipping. Sour cream or cream cheese (even if "light" or low-fat). Nondairy cream substitutes: coffee creamers and sour cream substitutes made with palm, palm kernel, hydrogenated oils, or coconut oil. Beverages  Allowed: Coffee (regular or decaffeinated), tea. Diet carbonated beverages, mineral water. Alcohol: Check with your caregiver. Moderation is  recommended.  Avoid: Whole milk, regular sodas, and juice drinks with added sugar. Condiments  Allowed: All seasonings and condiments. Cocoa powder. "Cream" sauces made with recommended ingredients.  Avoid: Carob powder made with hydrogenated fats. SAMPLE MENU Breakfast   cup orange juice   cup oatmeal  1 slice toast  1 tsp margarine  1 cup skim milk Lunch  [place] sandwich with 2 oz [place], 2 slices bread  Lettuce and tomato slices  Fresh fruit  Carrot sticks  Coffee or tea Snack  Fresh fruit or low-fat crackers Dinner  3 oz lean ground beef  1 baked potato  1 tsp margarine   cup asparagus  Lettuce salad  1 tbs non-creamy dressing   cup peach slices  1 cup skim milk Document Released: 03/16/2008 Document Revised: 12/07/2011 Document Reviewed: 08/07/2013 [employer] Patient Information 2015 [place], [place]. This information is not intended to replace advice given to you by your health care provider. Make sure you discuss any questions you have with your health care provider.

## 2014-01-29 ENCOUNTER — Telehealth: Payer: Self-pay | Admitting: Internal Medicine

## 2014-01-29 NOTE — Telephone Encounter (Signed)
Relevant patient education assigned to patient using Emmi. ° °

## 2014-02-11 ENCOUNTER — Other Ambulatory Visit: Payer: Self-pay | Admitting: Cardiology

## 2014-02-21 ENCOUNTER — Other Ambulatory Visit (INDEPENDENT_AMBULATORY_CARE_PROVIDER_SITE_OTHER): Payer: Medicare Other

## 2014-02-21 ENCOUNTER — Telehealth: Payer: Self-pay | Admitting: Cardiology

## 2014-02-21 DIAGNOSIS — D45 Polycythemia vera: Secondary | ICD-10-CM

## 2014-02-21 DIAGNOSIS — D751 Secondary polycythemia: Secondary | ICD-10-CM

## 2014-02-21 DIAGNOSIS — I119 Hypertensive heart disease without heart failure: Secondary | ICD-10-CM

## 2014-02-21 DIAGNOSIS — E7800 Pure hypercholesterolemia, unspecified: Secondary | ICD-10-CM

## 2014-02-21 LAB — LIPID PANEL
Cholesterol: 115 mg/dL (ref 0–200)
HDL: 22.3 mg/dL — AB (ref >39.00)
LDL Cholesterol: 54 mg/dL (ref 0–99)
NONHDL: 92.7
TRIGLYCERIDES: 193 mg/dL — AB (ref 0.0–149.0)
Total CHOL/HDL Ratio: 5
VLDL: 38.6 mg/dL (ref 0.0–40.0)

## 2014-02-21 LAB — CBC WITH DIFFERENTIAL/PLATELET
BASOS PCT: 0.6 % (ref 0.0–3.0)
Basophils Absolute: 0 10*3/uL (ref 0.0–0.1)
EOS ABS: 0.2 10*3/uL (ref 0.0–0.7)
EOS PCT: 3.4 % (ref 0.0–5.0)
HCT: 47.7 % (ref 39.0–52.0)
HEMOGLOBIN: 16.5 g/dL (ref 13.0–17.0)
LYMPHS PCT: 31.8 % (ref 12.0–46.0)
Lymphs Abs: 1.9 10*3/uL (ref 0.7–4.0)
MCHC: 34.5 g/dL (ref 30.0–36.0)
MCV: 102.5 fl — ABNORMAL HIGH (ref 78.0–100.0)
MONOS PCT: 8.7 % (ref 3.0–12.0)
Monocytes Absolute: 0.5 10*3/uL (ref 0.1–1.0)
NEUTROS ABS: 3.3 10*3/uL (ref 1.4–7.7)
NEUTROS PCT: 55.5 % (ref 43.0–77.0)
Platelets: 187 10*3/uL (ref 150.0–400.0)
RBC: 4.65 Mil/uL (ref 4.22–5.81)
RDW: 12.4 % (ref 11.5–15.5)
WBC: 6 10*3/uL (ref 4.0–10.5)

## 2014-02-21 LAB — HEPATIC FUNCTION PANEL
ALBUMIN: 4.2 g/dL (ref 3.5–5.2)
ALK PHOS: 48 U/L (ref 39–117)
ALT: 32 U/L (ref 0–53)
AST: 38 U/L — AB (ref 0–37)
Bilirubin, Direct: 0.1 mg/dL (ref 0.0–0.3)
TOTAL PROTEIN: 8 g/dL (ref 6.0–8.3)
Total Bilirubin: 1.1 mg/dL (ref 0.2–1.2)

## 2014-02-21 LAB — BASIC METABOLIC PANEL
BUN: 18 mg/dL (ref 6–23)
CALCIUM: 9.5 mg/dL (ref 8.4–10.5)
CO2: 26 mEq/L (ref 19–32)
CREATININE: 1.5 mg/dL (ref 0.4–1.5)
Chloride: 104 mEq/L (ref 96–112)
GFR: 48.68 mL/min — AB (ref >60.00)
Glucose, Bld: 79 mg/dL (ref 70–99)
Potassium: 3.7 mEq/L (ref 3.5–5.1)
SODIUM: 137 meq/L (ref 135–145)

## 2014-02-21 NOTE — Telephone Encounter (Signed)
Walk-In Patient form received from front reception with Mr. Baca document from [place] attached: given to Jerelene Redden on 9.3.15:djc

## 2014-02-23 ENCOUNTER — Other Ambulatory Visit: Payer: Self-pay | Admitting: Cardiology

## 2014-02-27 NOTE — Progress Notes (Signed)
Quick Note:  Please make copy of labs for patient visit. ______ 

## 2014-02-28 ENCOUNTER — Encounter: Payer: Self-pay | Admitting: Cardiology

## 2014-02-28 ENCOUNTER — Telehealth: Payer: Self-pay | Admitting: Cardiology

## 2014-02-28 ENCOUNTER — Ambulatory Visit (INDEPENDENT_AMBULATORY_CARE_PROVIDER_SITE_OTHER): Payer: Medicare Other | Admitting: Cardiology

## 2014-02-28 VITALS — BP 132/82 | HR 65 | Ht 73.0 in | Wt 212.1 lb

## 2014-02-28 DIAGNOSIS — E7800 Pure hypercholesterolemia, unspecified: Secondary | ICD-10-CM

## 2014-02-28 DIAGNOSIS — I119 Hypertensive heart disease without heart failure: Secondary | ICD-10-CM

## 2014-02-28 DIAGNOSIS — D682 Hereditary deficiency of other clotting factors: Secondary | ICD-10-CM

## 2014-02-28 DIAGNOSIS — D45 Polycythemia vera: Secondary | ICD-10-CM

## 2014-02-28 DIAGNOSIS — I82409 Acute embolism and thrombosis of unspecified deep veins of unspecified lower extremity: Secondary | ICD-10-CM

## 2014-02-28 DIAGNOSIS — D751 Secondary polycythemia: Secondary | ICD-10-CM

## 2014-02-28 NOTE — Assessment & Plan Note (Signed)
Patient has had no recurrent DVT.  He has not taken any recent long car trips.  He is not having any side effects from the Xarelto.

## 2014-02-28 NOTE — Patient Instructions (Signed)
No Xarelto on Saturday, Sunday, or Monday. Resume on Tuesday  Your physician wants you to follow-up in: 6 months with fasting labs (lp/bmet/hfp/cbc will receive a reminder letter in the mail two months in advance. If you don't receive a letter, please call our office to schedule the follow-up appointment.

## 2014-02-28 NOTE — Progress Notes (Signed)
Patrick Sosa Date of Birth:  21-Mar-1946 [address] [place], [place]  [address] [phone]  Fax   [phone]  HPI: This pleasant 68 year old gentleman is seen for a scheduled followup office visit. He has a history of essential hypertension and a history of previous deep vein thrombosis. He has a history of high cholesterol. He has a history of polycythemia.  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 He took a long trip in January 2015 and had no problems with recurrent phlebitis. He has had some recent dental problems and will be having dental surgery on March 04 2014.   Current Outpatient Prescriptions  Medication Sig Dispense Refill  . amLODipine (NORVASC) 5 MG tablet TAKE 1 TABLET BY MOUTH DAILY  90 tablet  0  . dimenhyDRINATE (DRAMAMINE) 50 MG tablet Take 75 mg by mouth at bedtime as needed. For sleep       . metoprolol tartrate (LOPRESSOR) 25 MG tablet TAKE 1 TABLET BY MOUTH TWICE DAILY  180 tablet  3  . spironolactone (ALDACTONE) 25 MG tablet TAKE 1 TABLET BY MOUTH DAILY  90 tablet  3  . XARELTO 20 MG TABS tablet TAKE 1 TABLET BY MOUTH EVERY DAY  180 tablet  1  . pravastatin (PRAVACHOL) 20 MG tablet Take 1 tablet (20 mg total) by mouth daily.  90 tablet  3   No current facility-administered medications for this visit.    No Known Allergies  Patient Active Problem List   Diagnosis Date Noted  . Hypertension     Priority: Medium  . Polycythemia     Priority: Medium  . DVT 08/15/2009    Priority: Medium  . HYPERTENSION 08/23/2007    Priority: Medium  . Benign hypertensive heart disease without heart failure 08/27/2013  . DVT (deep venous thrombosis)   . Hypercholesterolemia   . Dry cough   .  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 Former Smoker  . Quit date: 10/12/1987  Smokeless tobacco  . Never Used    History  Alcohol Use  . Yes    Comment: rarely    Family History  Problem Relation Age of Onset  . Alzheimer's disease Mother   . Pancreatic cancer Father   . Hypertension Father   . Heart attack Father   . Cancer Father   . Hypertension Sister     Review of Systems: The patient denies any heat or cold intolerance.  No weight gain or weight loss.  The patient denies headaches or blurry vision.  There is no cough or sputum production.  The patient denies dizziness.  There is no hematuria or hematochezia.  The patient denies any muscle aches or arthritis.  The patient denies any rash.  The patient denies frequent falling or instability.  There is no history of depression or anxiety.  All other systems were reviewed and are negative.   Physical Exam: Filed Vitals:   02/28/14 1054  BP: 132/82  Pulse: 65  The patient appears to be in no distress.  Head and neck exam reveals that the pupils are equal and reactive.  The extraocular movements are full.  There is no scleral icterus.  Mouth and pharynx are benign.  No lymphadenopathy.  No carotid bruits.  The jugular venous pressure is normal.  Thyroid is not enlarged or tender.  Chest is clear to percussion and auscultation.  No rales or rhonchi.  Expansion of the chest is symmetrical.  Heart reveals no abnormal lift or heave.  First and second heart sounds are normal.  There is no murmur gallop rub or click.  The abdomen is soft and nontender.  Bowel sounds are normoactive.  There is no hepatosplenomegaly or mass.  There are no abdominal bruits.  Extremities reveal no phlebitis or edema.  Pedal pulses are good.  There is no cyanosis or clubbing.  Neurologic exam is normal strength and no lateralizing weakness.  No sensory deficits.  Integument reveals no  rash   Assessment / Plan: 1. benign hypertensive heart disease without heart failure. 2. factor V Leiden deficiency on long-term anticoagulation with Xarelto.  Past history recurrent DVT  3.  Polycythemia 4. hypercholesterolemia   Plan: Continue same medication.  In anticipation of his dental surgery he will leave off his Xarelto for 2 days prior to surgery and resume it the day after surgery. Return in 6 months for followup office visit CBC lipid panel hepatic function panel and basal metabolic panel

## 2014-02-28 NOTE — Telephone Encounter (Signed)
Received request from Nurse fax box, documents faxed for surgical clearance. To: [facility] Fax number: [phone] Attention: [id number]/km

## 2014-02-28 NOTE — Assessment & Plan Note (Signed)
The patient has a history of polycythemia.  Recent lab work is satisfactory.  He has not required any recent phlebotomies.  [facility] will no longer except his blood because of being on Xarelto.

## 2014-02-28 NOTE — Assessment & Plan Note (Signed)
Blood pressure is stable on current therapy.  He is not having chest pain or shortness of breath.  No palpitations or fluttering of his heart

## 2014-03-28 ENCOUNTER — Other Ambulatory Visit: Payer: Self-pay | Admitting: Cardiology

## 2014-04-05 ENCOUNTER — Other Ambulatory Visit: Payer: Self-pay

## 2014-04-10 ENCOUNTER — Encounter: Payer: Self-pay | Admitting: Internal Medicine

## 2014-04-10 ENCOUNTER — Ambulatory Visit (INDEPENDENT_AMBULATORY_CARE_PROVIDER_SITE_OTHER): Payer: Medicare Other | Admitting: Internal Medicine

## 2014-04-10 VITALS — BP 134/90 | HR 71 | Temp 98.1°F | Resp 20 | Ht 73.0 in | Wt 210.0 lb

## 2014-04-10 DIAGNOSIS — N400 Enlarged prostate without lower urinary tract symptoms: Secondary | ICD-10-CM | POA: Insufficient documentation

## 2014-04-10 DIAGNOSIS — R300 Dysuria: Secondary | ICD-10-CM

## 2014-04-10 DIAGNOSIS — I10 Essential (primary) hypertension: Secondary | ICD-10-CM

## 2014-04-10 LAB — POCT URINALYSIS DIPSTICK
Bilirubin, UA: NEGATIVE
Glucose, UA: NEGATIVE
Leukocytes, UA: NEGATIVE
NITRITE UA: NEGATIVE
Spec Grav, UA: 1.015
Urobilinogen, UA: 0.2
pH, UA: 5.5

## 2014-04-10 MED ORDER — TAMSULOSIN HCL 0.4 MG PO CAPS
0.4000 mg | ORAL_CAPSULE | Freq: Every day | ORAL | Status: DC
Start: 2014-04-10 — End: 2014-07-30

## 2014-04-10 NOTE — Progress Notes (Signed)
Subjective:    Patient ID: Patrick Sosa, male    DOB: 07-19-1945, 68 y.o.   MRN: [id number]  HPI 68 year old patient who has essential hypertension.  He is seen today with a several-day history of urinary frequency, urgency, and some intermittent dysuria.  He also describes a fullness in the perineal area.  He has had a recent bleeding hemorrhoid that has resolved.  He has had a fairly recent colonoscopy.  He does have chronic nocturia x2  Past Medical History  Diagnosis Date  . Hypertension   . Polycythemia   . DVT (deep venous thrombosis)   . Hypercholesterolemia   . Dry cough   . DVT (deep venous thrombosis)     History   Social History  . Marital Status: Married    Spouse Name: N/A    Number of Children: N/A  . Years of Education: N/A   Occupational History  . Not on file.   Social History Main Topics  . Smoking status: Former Smoker    Quit date: 10/12/1987  . Smokeless tobacco: Never Used  . Alcohol Use: Yes     Comment: rarely  . Drug Use: No  . Sexual Activity: Yes   Other Topics Concern  . Not on file   Social History Narrative  . No narrative on file    Past Surgical History  Procedure Laterality Date  . Tonsillectomy      Family History  Problem Relation Age of Onset  . Alzheimer's disease Mother   . Pancreatic cancer Father   . Hypertension Father   . Heart attack Father   . Cancer Father   . Hypertension Sister     No Known Allergies  Current Outpatient Prescriptions on File Prior to Visit  Medication Sig Dispense Refill  . amLODipine (NORVASC) 5 MG tablet TAKE 1 TABLET BY MOUTH DAILY  90 tablet  0  . dimenhyDRINATE (DRAMAMINE) 50 MG tablet Take 75 mg by mouth at bedtime as needed. For sleep       . metoprolol tartrate (LOPRESSOR) 25 MG tablet TAKE 1 TABLET BY MOUTH TWICE DAILY  180 tablet  3  . pravastatin (PRAVACHOL) 20 MG tablet TAKE 1 TABLET BY MOUTH EVERY DAY  90 tablet  3  . spironolactone (ALDACTONE) 25 MG tablet TAKE 1 TABLET  BY MOUTH DAILY  90 tablet  3  . XARELTO 20 MG TABS tablet TAKE 1 TABLET BY MOUTH EVERY DAY  180 tablet  1   No current facility-administered medications on file prior to visit.    BP 134/90  Pulse 71  Temp(Src) 98.1 F (36.7 C) (Oral)  Resp 20  Ht 6\' 1"  (1.854 m)  Wt 210 lb (95.255 kg)  BMI 27.71 kg/m2  SpO2 95%      Review of Systems  Constitutional: Negative for fever, chills, appetite change and fatigue.  HENT: Negative for congestion, dental problem, ear pain, hearing loss, sore throat, tinnitus, trouble swallowing and voice change.   Eyes: Negative for pain, discharge and visual disturbance.  Respiratory: Negative for cough, chest tightness, wheezing and stridor.   Cardiovascular: Negative for chest pain, palpitations and leg swelling.  Gastrointestinal: Negative for nausea, vomiting, abdominal pain, diarrhea, constipation, blood in stool and abdominal distention.  Genitourinary: Positive for dysuria, urgency, frequency and difficulty urinating. Negative for hematuria, flank pain, discharge and genital sores.  Musculoskeletal: Negative for arthralgias, back pain, gait problem, joint swelling, myalgias and neck stiffness.  Skin: Negative for rash.  Neurological: Negative for  dizziness, syncope, speech difficulty, weakness, numbness and headaches.  Hematological: Negative for adenopathy. Does not bruise/bleed easily.  Psychiatric/Behavioral: Negative for behavioral problems and dysphoric mood. The patient is not nervous/anxious.        Objective:   Physical Exam  Constitutional: He appears well-developed and well-nourished. No distress.  Genitourinary: Guaiac negative stool.  Possibly mildly enlarged, symmetrical, smooth, and nontender          Assessment & Plan:   BPH.  We'll give a follow, Flomax Hypertension stable

## 2014-04-10 NOTE — Patient Instructions (Signed)
Benign Prostatic Hyperplasia An enlarged prostate (benign prostatic hyperplasia) is common in older men. You may experience the following:  Weak urine stream.  Dribbling.  Feeling like the bladder has not emptied completely.  Difficulty starting urination.  Getting up frequently at night to urinate.  Urinating more frequently during the day. HOME CARE INSTRUCTIONS  Monitor your prostatic hyperplasia for any changes. The following actions may help to alleviate any discomfort you are experiencing:  Give yourself time when you urinate.  Stay away from alcohol.  Avoid beverages containing caffeine, such as coffee, tea, and colas, because they can make the problem worse.  Avoid decongestants, antihistamines, and some prescription medicines that can make the problem worse.  Follow up with your health care provider for further treatment as recommended. SEEK MEDICAL CARE IF:  You are experiencing progressive difficulty voiding.  Your urine stream is progressively getting narrower.  You are awaking from sleep with the urge to void more frequently.  You are constantly feeling the need to void.  You experience loss of urine, especially in small amounts. SEEK IMMEDIATE MEDICAL CARE IF:   You develop increased pain with urination or are unable to urinate.  You develop severe abdominal pain, vomiting, a high fever, or fainting.  You develop back pain or blood in your urine. MAKE SURE YOU:   Understand these instructions.  Will watch your condition.  Will get help right away if you are not doing well or get worse. Document Released: 06/07/2005 Document Revised: 02/07/2013 Document Reviewed: 11/07/2012 [employer] Patient Information 2015 [employer]. This information is not intended to replace advice given to you by your health care provider. Make sure you discuss any questions you have with your health care provider.  

## 2014-04-10 NOTE — Progress Notes (Signed)
Pre visit review using our clinic review tool, if applicable. No additional management support is needed unless otherwise documented below in the visit note. 

## 2014-07-30 ENCOUNTER — Other Ambulatory Visit: Payer: Self-pay | Admitting: Internal Medicine

## 2014-08-24 ENCOUNTER — Other Ambulatory Visit: Payer: Self-pay | Admitting: Cardiology

## 2014-08-28 ENCOUNTER — Other Ambulatory Visit: Payer: Self-pay

## 2014-08-28 MED ORDER — METOPROLOL TARTRATE 25 MG PO TABS: 25.0000 mg | ORAL_TABLET | Freq: Two times a day (BID) | ORAL | Status: DC

## 2014-09-09 ENCOUNTER — Other Ambulatory Visit (INDEPENDENT_AMBULATORY_CARE_PROVIDER_SITE_OTHER): Payer: Medicare Other | Admitting: *Deleted

## 2014-09-09 DIAGNOSIS — E7800 Pure hypercholesterolemia, unspecified: Secondary | ICD-10-CM

## 2014-09-09 DIAGNOSIS — I119 Hypertensive heart disease without heart failure: Secondary | ICD-10-CM

## 2014-09-09 DIAGNOSIS — D751 Secondary polycythemia: Secondary | ICD-10-CM

## 2014-09-09 LAB — CBC WITH DIFFERENTIAL/PLATELET
BASOS ABS: 0 10*3/uL (ref 0.0–0.1)
Basophils Relative: 0.5 % (ref 0.0–3.0)
EOS ABS: 0.2 10*3/uL (ref 0.0–0.7)
Eosinophils Relative: 3.3 % (ref 0.0–5.0)
HCT: 44.9 % (ref 39.0–52.0)
Hemoglobin: 15.8 g/dL (ref 13.0–17.0)
LYMPHS PCT: 26.8 % (ref 12.0–46.0)
Lymphs Abs: 1.5 10*3/uL (ref 0.7–4.0)
MCHC: 35.2 g/dL (ref 30.0–36.0)
MCV: 100.3 fl — AB (ref 78.0–100.0)
MONO ABS: 0.6 10*3/uL (ref 0.1–1.0)
Monocytes Relative: 10.6 % (ref 3.0–12.0)
NEUTROS ABS: 3.4 10*3/uL (ref 1.4–7.7)
Neutrophils Relative %: 58.8 % (ref 43.0–77.0)
Platelets: 152 10*3/uL (ref 150.0–400.0)
RBC: 4.48 Mil/uL (ref 4.22–5.81)
RDW: 12.4 % (ref 11.5–15.5)
WBC: 5.7 10*3/uL (ref 4.0–10.5)

## 2014-09-09 LAB — BASIC METABOLIC PANEL
BUN: 19 mg/dL (ref 6–23)
CO2: 24 meq/L (ref 19–32)
Calcium: 9.4 mg/dL (ref 8.4–10.5)
Chloride: 104 mEq/L (ref 96–112)
Creatinine, Ser: 1.42 mg/dL (ref 0.40–1.50)
GFR: 52.97 mL/min — AB (ref >60.00)
GLUCOSE: 79 mg/dL (ref 70–99)
POTASSIUM: 4 meq/L (ref 3.5–5.1)
Sodium: 138 mEq/L (ref 135–145)

## 2014-09-09 LAB — LIPID PANEL
CHOL/HDL RATIO: 4
Cholesterol: 112 mg/dL (ref 0–200)
HDL: 26 mg/dL — AB (ref >39.00)
LDL CALC: 56 mg/dL (ref 0–99)
NONHDL: 86
TRIGLYCERIDES: 150 mg/dL — AB (ref 0.0–149.0)
VLDL: 30 mg/dL (ref 0.0–40.0)

## 2014-09-09 LAB — HEPATIC FUNCTION PANEL
ALK PHOS: 50 U/L (ref 39–117)
ALT: 26 U/L (ref 0–53)
AST: 34 U/L (ref 0–37)
Albumin: 4.2 g/dL (ref 3.5–5.2)
Bilirubin, Direct: 0.2 mg/dL (ref 0.0–0.3)
Total Bilirubin: 1 mg/dL (ref 0.2–1.2)
Total Protein: 7.6 g/dL (ref 6.0–8.3)

## 2014-09-09 NOTE — Progress Notes (Signed)
Quick Note:  Please make copy of labs for patient visit. ______ 

## 2014-09-09 NOTE — Addendum Note (Signed)
Addended by: Eulis Foster on: 09/09/2014 09:24 AM   Modules accepted: Orders

## 2014-09-11 ENCOUNTER — Encounter: Payer: Self-pay | Admitting: Cardiology

## 2014-09-11 ENCOUNTER — Ambulatory Visit (INDEPENDENT_AMBULATORY_CARE_PROVIDER_SITE_OTHER): Payer: Medicare Other | Admitting: Cardiology

## 2014-09-11 VITALS — BP 124/78 | HR 68 | Ht 73.0 in | Wt 209.8 lb

## 2014-09-11 DIAGNOSIS — D751 Secondary polycythemia: Secondary | ICD-10-CM

## 2014-09-11 DIAGNOSIS — E7800 Pure hypercholesterolemia, unspecified: Secondary | ICD-10-CM

## 2014-09-11 DIAGNOSIS — I119 Hypertensive heart disease without heart failure: Secondary | ICD-10-CM

## 2014-09-11 NOTE — Progress Notes (Signed)
Cardiology Office Note   Date:  09/11/2014   ID:  Patrick Sosa, DOB [birth year], MRN [id number]  PCP:  Nyoka Cowden, MD  Cardiologist:   Darlin Coco, MD   No chief complaint on file.     History of Present Illness: Patrick Sosa is a 69 y.o. male who presents for a six-month follow-up office visit.  This pleasant 69 year old gentleman is seen for a scheduled followup office visit. He has a history of essential hypertension and a history of previous deep vein thrombosis. He has a history of high cholesterol. He has a history of polycythemia. 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 He took a long trip in January 2015 and had no problems with recurrent phlebitis. He has had some recent dental problems and had dental surgery on March 04 2014.  The patient states that it was a difficult procedure.  He is supposed to go back for additional work but has not yet decided whether to do that or not. From the cardiac standpoint he has been feeling well.  No chest pain or shortness of breath.  No palpitations.  No recurrent DVT symptoms and he is tolerating the Xarelto without any evidence of external bleeding. Since we last saw him he was placed on Flomax for symptoms of BPH and has noted improvement in his symptoms.  Past Medical History  Diagnosis Date  . Hypertension   . Polycythemia   . DVT (deep venous thrombosis)   . Hypercholesterolemia   . Dry cough   . DVT (deep venous thrombosis)     Past Surgical History  Procedure Laterality Date  . Tonsillectomy       Current Outpatient Prescriptions  Medication Sig Dispense Refill  . amLODipine (NORVASC) 5 MG tablet TAKE 1 TABLET BY MOUTH DAILY 90 tablet 0  . dimenhyDRINATE (DRAMAMINE)  50 MG tablet Take 75 mg by mouth at bedtime as needed. For sleep     . metoprolol tartrate (LOPRESSOR) 25 MG tablet Take 1 tablet (25 mg total) by mouth 2 (two) times daily. 180 tablet 1  . pravastatin (PRAVACHOL) 20 MG tablet TAKE 1 TABLET BY MOUTH EVERY DAY 90 tablet 3  . spironolactone (ALDACTONE) 25 MG tablet TAKE 1 TABLET BY MOUTH EVERY DAY 90 tablet 0  . tamsulosin (FLOMAX) 0.4 MG CAPS capsule TAKE ONE CAPSULE BY MOUTH ONCE DAILY 30 capsule 5  . XARELTO 20 MG TABS tablet TAKE 1 TABLET BY MOUTH EVERY DAY 180 tablet 1   No current facility-administered medications for this visit.    Allergies:   Review of patient's allergies indicates no known allergies.    Social History:  The patient  reports that he quit smoking about 26 years ago. He has never used smokeless tobacco. He reports that he drinks alcohol. He reports that he does not use illicit drugs.   Family History:  The patient's family history includes Alzheimer's disease in his mother; Cancer in his father; Heart attack in his father; Hypertension in his father and sister; Pancreatic cancer in his father.    ROS:  Please see the history of present illness.   Otherwise, review of systems are positive for none.   All other systems are reviewed and negative.  PHYSICAL EXAM: VS:  BP 124/78 mmHg  Pulse 68  Ht 6\' 1"  (1.854 m)  Wt 209 lb 12.8 oz (95.165 kg)  BMI 27.69 kg/m2 , BMI Body mass index is 27.69 kg/(m^2). GEN: Well nourished, well developed, in no acute distress HEENT: normal Neck: no JVD, carotid bruits, or masses Cardiac: RRR; no murmurs, rubs, or gallops,no edema  Respiratory:  clear to auscultation bilaterally, normal work of breathing GI: soft, nontender, nondistended, + BS MS: no deformity or atrophy Skin: warm and dry, no rash Neuro:  Strength and sensation are intact Psych: euthymic mood, full affect   EKG:  EKG is ordered today. The ekg ordered today demonstrates normal sinus rhythm with sinus  arrhythmia.  No ischemic changes.   Recent Labs: 09/09/2014: ALT 26; BUN 19; Creatinine 1.42; Hemoglobin 15.8; Platelets 152.0; Potassium 4.0; Sodium 138    Lipid Panel    Component Value Date/Time   CHOL 112 09/09/2014 0924   TRIG 150.0* 09/09/2014 0924   HDL 26.00* 09/09/2014 0924   CHOLHDL 4 09/09/2014 0924   VLDL 30.0 09/09/2014 0924   LDLCALC 56 09/09/2014 0924   LDLDIRECT 65.4 02/23/2012 1049      Wt Readings from Last 3 Encounters:  09/11/14 209 lb 12.8 oz (95.165 kg)  04/10/14 210 lb (95.255 kg)  02/28/14 212 lb 1.9 oz (96.217 kg)      Other studies Reviewed:   ASSESSMENT AND PLAN:  1. benign hypertensive heart disease without heart failure. 2. factor V Leiden deficiency on long-term anticoagulation with Xarelto. Past history recurrent DVT  3. Polycythemia 4. hypercholesterolemia   Plan: Continue same medication. Lab work was reviewed today and is improved.  His HDL has improved and his renal function has improved. Return in 6 months for followup office visit CBC lipid panel hepatic function panel and basal metabolic panel   Current medicines are reviewed at length with the patient today.  The patient does not have concerns regarding medicines.  The following changes have been made:  no change   Orders Placed This Encounter  Procedures  . Lipid panel  . Hepatic function panel  . Basic metabolic panel  . CBC with Differential/Platelet  . Electrocardiogram report     Disposition: Recheck in 6 months for follow-up office visit.  Continue current medications.  At next visit we will check CBC lipid panel hepatic function panel and basal metabolic panel.  Continue regular walking exercise and try to lose weight.  He states that his dog is overweight and needs to lose more weight than he does.   Signed, Darlin Coco, MD  09/11/2014 10:16 AM    Patrick Sosa, [place], [place]  [address] Phone: [phone]; Fax:  [phone]

## 2014-09-11 NOTE — Patient Instructions (Signed)
Your physician recommends that you continue on your current medications as directed. Please refer to the Current Medication list given to you today.  Your physician wants you to follow-up in: 6 months with fasting labs (lp/bmet/hfp/CBC)  You will receive a reminder letter in the mail two months in advance. If you don't receive a letter, please call our office to schedule the follow-up appointment.

## 2014-09-17 ENCOUNTER — Other Ambulatory Visit: Payer: Self-pay | Admitting: Cardiology

## 2014-10-31 ENCOUNTER — Ambulatory Visit: Payer: Medicare Other | Admitting: Podiatry

## 2014-11-06 ENCOUNTER — Ambulatory Visit (INDEPENDENT_AMBULATORY_CARE_PROVIDER_SITE_OTHER): Payer: Medicare Other | Admitting: Podiatry

## 2014-11-06 ENCOUNTER — Ambulatory Visit (INDEPENDENT_AMBULATORY_CARE_PROVIDER_SITE_OTHER): Payer: Medicare Other

## 2014-11-06 ENCOUNTER — Encounter: Payer: Self-pay | Admitting: Podiatry

## 2014-11-06 VITALS — BP 138/81 | HR 62 | Resp 15

## 2014-11-06 DIAGNOSIS — M2041 Other hammer toe(s) (acquired), right foot: Secondary | ICD-10-CM | POA: Diagnosis not present

## 2014-11-06 DIAGNOSIS — M779 Enthesopathy, unspecified: Secondary | ICD-10-CM

## 2014-11-06 NOTE — Patient Instructions (Signed)

## 2014-11-06 NOTE — Progress Notes (Signed)
   Subjective:    Patient ID: Patrick Sosa, male    DOB: 01-30-1946, 69 y.o.   MRN: [id number]  HPI Pt presents with left foot pain in ball of foot, for 6 months, worsened over time. Right great ingrown nail and bilateral nail fungus are present   Review of Systems  All other systems reviewed and are negative.      Objective:   Physical Exam        Assessment & Plan:

## 2014-11-07 NOTE — Progress Notes (Signed)
Subjective:     Patient ID: Patrick Sosa, male   DOB: 11-09-1945, 69 y.o.   MRN: [id number]  HPI patient presents stating I been getting pain in the plantar of my left over right foot that has still been present and this nail on my right big toe I wanted you to check to see if there's anything we could do. Patient does have moderate collapse of the arch via lateral and has inflammation the lesser MPJs bilateral   Review of Systems  All other systems reviewed and are negative.      Objective:   Physical Exam  Constitutional: He is oriented to person, place, and time.  Cardiovascular: Intact distal pulses.   Musculoskeletal: Normal range of motion.  Neurological: He is oriented to person, place, and time.  Skin: Skin is warm.  Nursing note and vitals reviewed.  neurovascular status intact muscle strength adequate with range of motion of the subtalar midtarsal joint within normal limits. Patient does have quite a bit of collapse medial longitudinal arch bilateral with inflammation around the lesser MPJs left over right and a damaged right big toenail that is yellow and discolored but is not currently painful. 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 Patient will be seen back for is to recheck again in the next 6-8 weeks or earlier if necessary and will have orthotics dispensed

## 2014-11-24 ENCOUNTER — Other Ambulatory Visit: Payer: Self-pay | Admitting: Cardiology

## 2014-12-10 ENCOUNTER — Ambulatory Visit: Payer: Medicare Other | Admitting: *Deleted

## 2014-12-10 DIAGNOSIS — M779 Enthesopathy, unspecified: Secondary | ICD-10-CM

## 2014-12-10 NOTE — Patient Instructions (Signed)

## 2014-12-10 NOTE — Progress Notes (Signed)
Patient ID: KYLER GERMER, male   DOB: 18-Apr-1946, 69 y.o.   MRN: [id number] PICKING UP INSERTS

## 2014-12-16 ENCOUNTER — Other Ambulatory Visit: Payer: Self-pay

## 2015-01-02 ENCOUNTER — Other Ambulatory Visit: Payer: Self-pay | Admitting: Cardiology

## 2015-01-20 ENCOUNTER — Other Ambulatory Visit: Payer: Self-pay | Admitting: Internal Medicine

## 2015-02-21 ENCOUNTER — Other Ambulatory Visit: Payer: Self-pay | Admitting: Cardiology

## 2015-03-12 ENCOUNTER — Other Ambulatory Visit: Payer: Self-pay | Admitting: *Deleted

## 2015-03-12 MED ORDER — AMLODIPINE BESYLATE 5 MG PO TABS: 5.0000 mg | ORAL_TABLET | Freq: Every day | ORAL | Status: DC

## 2015-03-14 ENCOUNTER — Other Ambulatory Visit: Payer: Self-pay | Admitting: Cardiology

## 2015-03-17 ENCOUNTER — Other Ambulatory Visit (INDEPENDENT_AMBULATORY_CARE_PROVIDER_SITE_OTHER): Payer: Medicare Other | Admitting: *Deleted

## 2015-03-17 DIAGNOSIS — I119 Hypertensive heart disease without heart failure: Secondary | ICD-10-CM

## 2015-03-17 DIAGNOSIS — E7800 Pure hypercholesterolemia, unspecified: Secondary | ICD-10-CM

## 2015-03-17 DIAGNOSIS — D751 Secondary polycythemia: Secondary | ICD-10-CM | POA: Diagnosis not present

## 2015-03-17 LAB — HEPATIC FUNCTION PANEL
ALK PHOS: 52 U/L (ref 39–117)
ALT: 27 U/L (ref 0–53)
AST: 33 U/L (ref 0–37)
Albumin: 4.4 g/dL (ref 3.5–5.2)
BILIRUBIN TOTAL: 0.9 mg/dL (ref 0.2–1.2)
Bilirubin, Direct: 0.1 mg/dL (ref 0.0–0.3)
TOTAL PROTEIN: 7.8 g/dL (ref 6.0–8.3)

## 2015-03-17 LAB — LIPID PANEL
CHOLESTEROL: 120 mg/dL (ref 0–200)
HDL: 25.2 mg/dL — ABNORMAL LOW (ref >39.00)
LDL Cholesterol: 65 mg/dL (ref 0–99)
NonHDL: 95.12
TRIGLYCERIDES: 153 mg/dL — AB (ref 0.0–149.0)
Total CHOL/HDL Ratio: 5
VLDL: 30.6 mg/dL (ref 0.0–40.0)

## 2015-03-17 LAB — CBC WITH DIFFERENTIAL/PLATELET
BASOS ABS: 0 10*3/uL (ref 0.0–0.1)
Basophils Relative: 0.7 % (ref 0.0–3.0)
EOS ABS: 0.2 10*3/uL (ref 0.0–0.7)
EOS PCT: 3.7 % (ref 0.0–5.0)
HCT: 46.7 % (ref 39.0–52.0)
Hemoglobin: 16.1 g/dL (ref 13.0–17.0)
LYMPHS ABS: 1.8 10*3/uL (ref 0.7–4.0)
Lymphocytes Relative: 28.7 % (ref 12.0–46.0)
MCHC: 34.4 g/dL (ref 30.0–36.0)
MCV: 102.6 fl — ABNORMAL HIGH (ref 78.0–100.0)
MONO ABS: 0.6 10*3/uL (ref 0.1–1.0)
Monocytes Relative: 9.6 % (ref 3.0–12.0)
NEUTROS PCT: 57.3 % (ref 43.0–77.0)
Neutro Abs: 3.6 10*3/uL (ref 1.4–7.7)
Platelets: 165 10*3/uL (ref 150.0–400.0)
RBC: 4.55 Mil/uL (ref 4.22–5.81)
RDW: 12.1 % (ref 11.5–15.5)
WBC: 6.2 10*3/uL (ref 4.0–10.5)

## 2015-03-17 LAB — BASIC METABOLIC PANEL
BUN: 24 mg/dL — AB (ref 6–23)
CALCIUM: 9.1 mg/dL (ref 8.4–10.5)
CO2: 24 mEq/L (ref 19–32)
Chloride: 104 mEq/L (ref 96–112)
Creatinine, Ser: 1.29 mg/dL (ref 0.40–1.50)
GFR: 59.08 mL/min — AB (ref >60.00)
Glucose, Bld: 96 mg/dL (ref 70–99)
Potassium: 4.1 mEq/L (ref 3.5–5.1)
Sodium: 137 mEq/L (ref 135–145)

## 2015-03-18 NOTE — Progress Notes (Signed)
Quick Note:  Please report to patient. The recent labs are stable. Continue same medication and careful diet. ______ 

## 2015-03-21 ENCOUNTER — Ambulatory Visit (INDEPENDENT_AMBULATORY_CARE_PROVIDER_SITE_OTHER): Payer: Medicare Other | Admitting: Cardiology

## 2015-03-21 ENCOUNTER — Encounter: Payer: Self-pay | Admitting: Cardiology

## 2015-03-21 VITALS — BP 138/80 | HR 63 | Ht 73.0 in | Wt 208.2 lb

## 2015-03-21 DIAGNOSIS — I119 Hypertensive heart disease without heart failure: Secondary | ICD-10-CM | POA: Diagnosis not present

## 2015-03-21 DIAGNOSIS — E7800 Pure hypercholesterolemia, unspecified: Secondary | ICD-10-CM

## 2015-03-21 NOTE — Progress Notes (Signed)
Cardiology Office Note   Date:  03/21/2015   ID:  LUISENRIQUE Sosa, DOB February 26, 1946, MRN [id number]  PCP:  Nyoka Cowden, MD  Cardiologist: Darlin Coco MD  No chief complaint on file.     History of Present Illness: Patrick Sosa is a 69 y.o. male who presents for a six-month follow-up office visit.  This pleasant 69 year old gentleman is seen for a scheduled followup office visit. He has a history of essential hypertension and a history of previous deep vein thrombosis. He has a history of high cholesterol. He has a history of polycythemia. 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 He took a long trip in January 2015 and had no problems with recurrent phlebitis. From the cardiac standpoint he has been feeling well. No chest pain or shortness of breath.  No recent palpitations. No recurrent DVT symptoms and he is tolerating the Xarelto without any evidence of external bleeding. Since we last saw him he was placed on Flomax for symptoms of BPH and has noted improvement in his symptoms.  Past Medical History  Diagnosis Date  . Hypertension   . Polycythemia   . DVT (deep venous thrombosis)   . Hypercholesterolemia   . Dry cough   . DVT (deep venous thrombosis)     Past Surgical History  Procedure Laterality Date  . Tonsillectomy       Current Outpatient Prescriptions  Medication Sig Dispense Refill  . amLODipine (NORVASC) 5 MG tablet Take 1 tablet (5 mg total) by mouth daily. 30 tablet 5  . dimenhyDRINATE (DRAMAMINE) 50 MG tablet Take 75 mg by mouth at bedtime as needed. For sleep     . metoprolol tartrate (LOPRESSOR) 25 MG tablet TAKE 1 TABLET(25 MG) BY MOUTH TWICE DAILY 180 tablet 0  . pravastatin (PRAVACHOL) 20 MG tablet TAKE 1  TABLET BY MOUTH EVERY DAY 90 tablet 0  . spironolactone (ALDACTONE) 25 MG tablet TAKE 1 TABLET BY MOUTH EVERY DAY 90 tablet 1  . tamsulosin (FLOMAX) 0.4 MG CAPS capsule TAKE ONE CAPSULE BY MOUTH ONCE DAILY 30 capsule 5  . XARELTO 20 MG TABS tablet TAKE 1 TABLET BY MOUTH DAILY 180 tablet 0   No current facility-administered medications for this visit.    Allergies:   Review of patient's allergies indicates no known allergies.    Social History:  The patient  reports that he quit smoking about 27 years ago. He has never used smokeless tobacco. He reports that he drinks alcohol. He reports that he does not use illicit drugs.   Family History:  The patient's family history includes Alzheimer's disease in his mother; Cancer in his father; Heart attack in his father; Hypertension in his father and sister; Pancreatic cancer in his father.    ROS:  Please see the history of present illness.   Otherwise, review of systems are positive for none.   All other systems are reviewed and negative.    PHYSICAL EXAM: VS:  BP 138/80 mmHg  Pulse 63  Ht 6\' 1"  (1.854 m)  Wt 208 lb 3.2 oz (94.439 kg)  BMI 27.47 kg/m2 , BMI Body mass index is 27.47 kg/(m^2). GEN: Well nourished, well developed, in no acute distress HEENT: normal Neck: no  JVD, carotid bruits, or masses Cardiac: RRR; no murmurs, rubs, or gallops,no edema  Respiratory:  clear to auscultation bilaterally, normal work of breathing GI: soft, nontender, nondistended, + BS MS: no deformity or atrophy Skin: warm and dry, no rash Neuro:  Strength and sensation are intact Psych: euthymic mood, full affect   EKG:  EKG is not ordered today.    Recent Labs: 03/17/2015: ALT 27; BUN 24*; Creatinine, Ser 1.29; Hemoglobin 16.1; Platelets 165.0; Potassium 4.1; Sodium 137    Lipid Panel    Component Value Date/Time   CHOL 120 03/17/2015 0751   TRIG 153.0* 03/17/2015 0751   HDL 25.20* 03/17/2015 0751   CHOLHDL 5 03/17/2015 0751   VLDL 30.6  03/17/2015 0751   LDLCALC 65 03/17/2015 0751   LDLDIRECT 65.4 02/23/2012 1049      Wt Readings from Last 3 Encounters:  03/21/15 208 lb 3.2 oz (94.439 kg)  09/11/14 209 lb 12.8 oz (95.165 kg)  04/10/14 210 lb (95.255 kg)        ASSESSMENT AND PLAN:  1. benign hypertensive heart disease without heart failure. 2. factor V Leiden deficiency on long-term anticoagulation with Xarelto. Past history recurrent DVT  3. Polycythemia 4. hypercholesterolemia   Plan: Continue same medication. Lab work was reviewed today and is improved. His HDL has improved but his HDL is still low and I've encouraged him to exercise more.   his renal function has improved. Return in 6 months for followup office visit CBC lipid panel hepatic function panel and basal metabolic panel   Current medicines are reviewed at length with the patient today.  The patient does not have concerns regarding medicines.  The following changes have been made:  no change  Labs/ tests ordered today include:   Orders Placed This Encounter  Procedures  . Hepatic function panel  . Basic metabolic panel  . Lipid panel  . CBC with Differential/Platelet    Signed, Darlin Coco MD 03/21/2015 3:53 PM    [facility] [place], [place], [place]  [address] Phone: [phone]; Fax: [phone]

## 2015-03-21 NOTE — Patient Instructions (Signed)
Medication Instructions:  Your physician recommends that you continue on your current medications as directed. Please refer to the Current Medication list given to you today.  Labwork: none  Testing/Procedures: none  Follow-Up: Your physician recommends that you schedule a follow-up appointment in: 6 month ov/lp/bmt/hfp

## 2015-04-01 ENCOUNTER — Encounter: Payer: Self-pay | Admitting: Internal Medicine

## 2015-04-01 ENCOUNTER — Ambulatory Visit (INDEPENDENT_AMBULATORY_CARE_PROVIDER_SITE_OTHER): Payer: Medicare Other | Admitting: Internal Medicine

## 2015-04-01 VITALS — BP 118/80 | HR 66 | Temp 98.5°F | Resp 20 | Ht 72.25 in | Wt 208.0 lb

## 2015-04-01 DIAGNOSIS — N400 Enlarged prostate without lower urinary tract symptoms: Secondary | ICD-10-CM

## 2015-04-01 DIAGNOSIS — Z0000 Encounter for general adult medical examination without abnormal findings: Secondary | ICD-10-CM | POA: Diagnosis not present

## 2015-04-01 DIAGNOSIS — Z23 Encounter for immunization: Secondary | ICD-10-CM

## 2015-04-01 DIAGNOSIS — I10 Essential (primary) hypertension: Secondary | ICD-10-CM | POA: Diagnosis not present

## 2015-04-01 DIAGNOSIS — E7800 Pure hypercholesterolemia, unspecified: Secondary | ICD-10-CM | POA: Diagnosis not present

## 2015-04-01 NOTE — Progress Notes (Signed)
Subjective:    Patient ID: Patrick Sosa, male    DOB: 08-25-1945, 69 y.o.   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   Medical problems include history of hypertension, dyslipidemia, and polycythemia.  He has a history recurrent DVT with factor V deficiency.  Remains on chronic anticoagulation.  No concerns or complaints.  Recent laboratory studies reviewed.  He did have screening colonoscopy in 2010 that revealed  significant diverticular disease, as well as internal hemorrhoids. Now retired for 2 years  Past Medical History  Diagnosis Date  . Hypertension   . Polycythemia   . DVT (deep venous thrombosis) ([place])   . Hypercholesterolemia   . Dry cough   . DVT (deep venous thrombosis) ([place])     Social History   Social History  . Marital Status: Married    Spouse Name: N/A  . Number of Children: N/A  . Years of Education: N/A   Occupational History  . Not on file.   Social History Main Topics  . Smoking status: Former Smoker    Quit date: 10/12/1987  . Smokeless tobacco: Never Used  . Alcohol Use: No     Comment: rarely  . Drug Use: No  . Sexual Activity: Yes   Other Topics Concern  . Not on file   Social History Narrative    Past Surgical History  Procedure Laterality Date  . Tonsillectomy      Family History  Problem Relation Age of Onset  . Alzheimer's disease Mother   . Pancreatic cancer Father   . Hypertension Father   . Heart attack Father   . Cancer Father   . Hypertension Sister     No Known Allergies  Current Outpatient Prescriptions on File Prior to Visit  Medication Sig Dispense Refill  . amLODipine (NORVASC) 5 MG tablet Take 1 tablet (5 mg total) by mouth daily. 30 tablet 5  . dimenhyDRINATE (DRAMAMINE) 50 MG tablet Take 75 mg by mouth at bedtime as needed. For sleep     . metoprolol tartrate (LOPRESSOR) 25 MG tablet TAKE 1 TABLET(25 MG) BY MOUTH TWICE DAILY 180 tablet 0  . pravastatin  (PRAVACHOL) 20 MG tablet TAKE 1 TABLET BY MOUTH EVERY DAY 90 tablet 0  . spironolactone (ALDACTONE) 25 MG tablet TAKE 1 TABLET BY MOUTH EVERY DAY 90 tablet 1  . tamsulosin (FLOMAX) 0.4 MG CAPS capsule TAKE ONE CAPSULE BY MOUTH ONCE DAILY 30 capsule 5  . XARELTO 20 MG TABS tablet TAKE 1 TABLET BY MOUTH DAILY 180 tablet 0   No current facility-administered medications on file prior to visit.    BP 118/80 mmHg  Pulse 66  Temp(Src) 98.5 F (36.9 C) (Oral)  Resp 20  Ht 6' 0.25" (1.835 m)  Wt 208 lb (94.348 kg)  BMI 28.02 kg/m2  SpO2 98%   1. Risk factors, based on past  M,S,F history.  Risk factors include hypertension, and dyslipidemia.  He has remote tobacco history, but discontinued at age 69.  He has a history of polycythemia  2.  Physical activities: No exercise limitation.  Walks his dog daily.  Very active with yard work  3.  Depression/mood: No history depression or mood disorder  4.  Hearing: No deficits  5.  ADL's: Independent in all aspects of daily living  6.  Fall risk: Low  7.  Home safety: No problems identified  8.  Height weight, and visual  acuity; height and weight stable.  Cataract extraction surgery in January 2016.  Does obtain an annual eye, examination.  Each November.  9.  Counseling: Heart healthy diet regular exercise encouraged  10. Lab orders based on risk factors: Laboratory profile including lipid panel reviewed  11. Referral : Not appropriate at this time  12. Care plan: Continue heart healthy diet regular exercise.  Patient appears to be near ideal body weight  13. Cognitive assessment: Alert and oriented with normal affect.  No cognitive dysfunction  14. Screening: Eye examination in November.  Repeat colonoscopy 2020  15. Provider List Update: Cardiology and GI as well as ophthalmology    Review of Systems  Constitutional: Negative for fever, chills, activity change, appetite change and fatigue.  HENT: Positive for tinnitus. Negative  for congestion, dental problem, ear pain, hearing loss, mouth sores, rhinorrhea, sinus pressure, sneezing, trouble swallowing and voice change.   Eyes: Negative for photophobia, pain, redness and visual disturbance.  Respiratory: Positive for cough. Negative for apnea, choking, chest tightness, shortness of breath and wheezing.   Cardiovascular: Negative for chest pain, palpitations and leg swelling.  Gastrointestinal: Negative for nausea, vomiting, abdominal pain, diarrhea, constipation, blood in stool, abdominal distention, anal bleeding and rectal pain.  Genitourinary: Negative for dysuria, urgency, frequency, hematuria, flank pain, decreased urine volume, discharge, penile swelling, scrotal swelling, difficulty urinating, genital sores and testicular pain.  Musculoskeletal: Negative for myalgias, back pain, joint swelling, arthralgias, gait problem, neck pain and neck stiffness.  Skin: Negative for color change, rash and wound.  Neurological: Negative for dizziness, tremors, seizures, syncope, facial asymmetry, speech difficulty, weakness, light-headedness, numbness and headaches.  Hematological: Negative for adenopathy. Does not bruise/bleed easily.  Psychiatric/Behavioral: Negative for suicidal ideas, hallucinations, behavioral problems, confusion, sleep disturbance, self-injury, dysphoric mood, decreased concentration and agitation. The patient is not nervous/anxious.        Objective:   Physical Exam  Constitutional: He appears well-developed and well-nourished.  HENT:  Head: Normocephalic and atraumatic.  Right Ear: External ear normal.  Left Ear: External ear normal.  Nose: Nose normal.  Mouth/Throat: Oropharynx is clear and moist.  Eyes: Conjunctivae and EOM are normal. Pupils are equal, round, and reactive to light. No scleral icterus.  Neck: Normal range of motion. Neck supple. No JVD present. No thyromegaly present.  Cardiovascular: Regular rhythm, normal heart sounds and  intact distal pulses.  Exam reveals no gallop and no friction rub.   No murmur heard. Pedal pulses  diminished on the left  Pulmonary/Chest: Effort normal. He exhibits no tenderness.  A few crackles, right base  Abdominal: Soft. Bowel sounds are normal. He exhibits no distension and no mass. There is no tenderness.  Genitourinary: Penis normal. Guaiac negative stool.  Prostatic enlargement  Musculoskeletal: Normal range of motion. He exhibits no edema or tenderness.  Lymphadenopathy:    He has no cervical adenopathy.  Neurological: He is alert. He has normal reflexes. No cranial nerve deficit. Coordination normal.  Skin: Skin is warm and dry. No rash noted.  Psychiatric: He has a normal mood and affect. His behavior is normal.          Assessment & Plan:   Preventive health examination. Hypertension.  Well controlled on present regimen.  No change Dyslipidemia.  Continue statin therapy Recurrent DVT/factor V Leiden deficiency.  Continue chronic anticoagulation.  Patient may have to switch that to come in and did 2 proximal concerns History of polycythemia    Recheck one year

## 2015-04-01 NOTE — Patient Instructions (Signed)
Limit your sodium (Salt) intake  Please check your blood pressure on a regular basis.  If it is consistently greater than 150/90, please make an office appointment.    It is important that you exercise regularly, at least 20 minutes 3 to 4 times per week.  If you develop chest pain or shortness of breath seek  medical attention.  Health Maintenance, Male A healthy lifestyle and preventative care can promote health and wellness.  Maintain regular health, dental, and eye exams.  Eat a healthy diet. Foods like vegetables, fruits, whole grains, low-fat dairy products, and lean protein foods contain the nutrients you need and are low in calories. Decrease your intake of foods high in solid fats, added sugars, and salt. Get information about a proper diet from your health care provider, if necessary.  Regular physical exercise is one of the most important things you can do for your health. Most adults should get at least 150 minutes of moderate-intensity exercise (any activity that increases your heart rate and causes you to sweat) each week. In addition, most adults need muscle-strengthening exercises on 2 or more days a week.   Maintain a healthy weight. The body mass index (BMI) is a screening tool to identify possible weight problems. It provides an estimate of body fat based on height and weight. Your health care provider can find your BMI and can help you achieve or maintain a healthy weight. For males 20 years and older:  A BMI below 18.5 is considered underweight.  A BMI of 18.5 to 24.9 is normal.  A BMI of 25 to 29.9 is considered overweight.  A BMI of 30 and above is considered obese.  Maintain normal blood lipids and cholesterol by exercising and minimizing your intake of saturated fat. Eat a balanced diet with plenty of fruits and vegetables. Blood tests for lipids and cholesterol should begin at age 67 and be repeated every 5 years. If your lipid or cholesterol levels are high, you  are over age 53, or you are at high risk for heart disease, you may need your cholesterol levels checked more frequently.Ongoing high lipid and cholesterol levels should be treated with medicines if diet and exercise are not working.  If you smoke, find out from your health care provider how to quit. If you do not use tobacco, do not start.  Lung cancer screening is recommended for adults aged 25-80 years who are at high risk for developing lung cancer because of a history of smoking. A yearly low-dose CT scan of the lungs is recommended for people who have at least a 30-pack-year history of smoking and are current smokers or have quit within the past 15 years. A pack year of smoking is smoking an average of 1 pack of cigarettes a day for 1 year (for example, a 30-pack-year history of smoking could mean smoking 1 pack a day for 30 years or 2 packs a day for 15 years). Yearly screening should continue until the smoker has stopped smoking for at least 15 years. Yearly screening should be stopped for people who develop a health problem that would prevent them from having lung cancer treatment.  If you choose to drink alcohol, do not have more than 2 drinks per day. One drink is considered to be 12 oz (360 mL) of beer, 5 oz (150 mL) of wine, or 1.5 oz (45 mL) of liquor.  Avoid the use of street drugs. Do not share needles with anyone. Ask for help if  you need support or instructions about stopping the use of drugs.  High blood pressure causes heart disease and increases the risk of stroke. High blood pressure is more likely to develop in:  People who have blood pressure in the end of the normal range (100-139/85-89 mm Hg).  People who are overweight or obese.  People who are African American.  If you are 105-99 years of age, have your blood pressure checked every 3-5 years. If you are 83 years of age or older, have your blood pressure checked every year. You should have your blood pressure measured  twice--once when you are at a hospital or clinic, and once when you are not at a hospital or clinic. Record the average of the two measurements. To check your blood pressure when you are not at a hospital or clinic, you can use:  An automated blood pressure machine at a pharmacy.  A home blood pressure monitor.  If you are 4-63 years old, ask your health care provider if you should take aspirin to prevent heart disease.  Diabetes screening involves taking a blood sample to check your fasting blood sugar level. This should be done once every 3 years after age 53 if you are at a normal weight and without risk factors for diabetes. Testing should be considered at a younger age or be carried out more frequently if you are overweight and have at least 1 risk factor for diabetes.  Colorectal cancer can be detected and often prevented. Most routine colorectal cancer screening begins at the age of 21 and continues through age 57. However, your health care provider may recommend screening at an earlier age if you have risk factors for colon cancer. On a yearly basis, your health care provider may provide home test kits to check for hidden blood in the stool. A small camera at the end of a tube may be used to directly examine the colon (sigmoidoscopy or colonoscopy) to detect the earliest forms of colorectal cancer. Talk to your health care provider about this at age 54 when routine screening begins. A direct exam of the colon should be repeated every 5-10 years through age 30, unless early forms of precancerous polyps or small growths are found.  People who are at an increased risk for hepatitis B should be screened for this virus. You are considered at high risk for hepatitis B if:  You were born in a country where hepatitis B occurs often. Talk with your health care provider about which countries are considered high risk.  Your parents were born in a high-risk country and you have not received a shot to  protect against hepatitis B (hepatitis B vaccine).  You have HIV or AIDS.  You use needles to inject street drugs.  You live with, or have sex with, someone who has hepatitis B.  You are a man who has sex with other men (MSM).  You get hemodialysis treatment.  You take certain medicines for conditions like cancer, organ transplantation, and autoimmune conditions.  Hepatitis C blood testing is recommended for all people born from [birth year] through 1963 and any individual with known risk factors for hepatitis C.  Healthy men should no longer receive prostate-specific antigen (PSA) blood tests as part of routine cancer screening. Talk to your health care provider about prostate cancer screening.  Testicular cancer screening is not recommended for adolescents or adult males who have no symptoms. Screening includes self-exam, a health care provider exam, and other screening tests. Consult  with your health care provider about any symptoms you have or any concerns you have about testicular cancer.  Practice safe sex. Use condoms and avoid high-risk sexual practices to reduce the spread of sexually transmitted infections (STIs).  You should be screened for STIs, including gonorrhea and chlamydia if:  You are sexually active and are younger than 24 years.  You are older than 24 years, and your health care provider tells you that you are at risk for this type of infection.  Your sexual activity has changed since you were last screened, and you are at an increased risk for chlamydia or gonorrhea. Ask your health care provider if you are at risk.  If you are at risk of being infected with HIV, it is recommended that you take a prescription medicine daily to prevent HIV infection. This is called pre-exposure prophylaxis (PrEP). You are considered at risk if:  You are a man who has sex with other men (MSM).  You are a heterosexual man who is sexually active with multiple partners.  You take drugs by  injection.  You are sexually active with a partner who has HIV.  Talk with your health care provider about whether you are at high risk of being infected with HIV. If you choose to begin PrEP, you should first be tested for HIV. You should then be tested every 3 months for as long as you are taking PrEP.  Use sunscreen. Apply sunscreen liberally and repeatedly throughout the day. You should seek shade when your shadow is shorter than you. Protect yourself by wearing long sleeves, pants, a wide-brimmed hat, and sunglasses year round whenever you are outdoors.  Tell your health care provider of new moles or changes in moles, especially if there is a change in shape or color. Also, tell your health care provider if a mole is larger than the size of a pencil eraser.  A one-time screening for abdominal aortic aneurysm (AAA) and surgical repair of large AAAs by ultrasound is recommended for men aged 40-75 years who are current or former smokers.  Stay current with your vaccines (immunizations).   This information is not intended to replace advice given to you by your health care provider. Make sure you discuss any questions you have with your health care provider.   Document Released: 12/04/2007 Document Revised: 06/28/2014 Document Reviewed: 11/02/2010 Elsevier Interactive Patient Education [employer].

## 2015-04-01 NOTE — Progress Notes (Signed)
Pre visit review using our clinic review tool, if applicable. No additional management support is needed unless otherwise documented below in the visit note. 

## 2015-05-19 ENCOUNTER — Other Ambulatory Visit: Payer: Self-pay | Admitting: Cardiology

## 2015-06-10 ENCOUNTER — Other Ambulatory Visit: Payer: Self-pay | Admitting: Cardiology

## 2015-06-30 ENCOUNTER — Other Ambulatory Visit: Payer: Self-pay | Admitting: Cardiology

## 2015-07-13 ENCOUNTER — Other Ambulatory Visit: Payer: Self-pay | Admitting: Internal Medicine

## 2015-08-10 ENCOUNTER — Other Ambulatory Visit: Payer: Self-pay | Admitting: Cardiology

## 2015-09-19 ENCOUNTER — Other Ambulatory Visit (INDEPENDENT_AMBULATORY_CARE_PROVIDER_SITE_OTHER): Payer: Medicare Other | Admitting: *Deleted

## 2015-09-19 DIAGNOSIS — I10 Essential (primary) hypertension: Secondary | ICD-10-CM

## 2015-09-19 DIAGNOSIS — E7800 Pure hypercholesterolemia, unspecified: Secondary | ICD-10-CM | POA: Diagnosis not present

## 2015-09-19 LAB — CBC WITH DIFFERENTIAL/PLATELET
BASOS ABS: 0 10*3/uL (ref 0.0–0.1)
BASOS PCT: 0 % (ref 0–1)
EOS ABS: 0.2 10*3/uL (ref 0.0–0.7)
EOS PCT: 4 % (ref 0–5)
HCT: 44.6 % (ref 39.0–52.0)
Hemoglobin: 15.9 g/dL (ref 13.0–17.0)
Lymphocytes Relative: 30 % (ref 12–46)
Lymphs Abs: 1.7 10*3/uL (ref 0.7–4.0)
MCH: 35.1 pg — AB (ref 26.0–34.0)
MCHC: 35.7 g/dL (ref 30.0–36.0)
MCV: 98.5 fL (ref 78.0–100.0)
MONOS PCT: 9 % (ref 3–12)
MPV: 10.6 fL (ref 8.6–12.4)
Monocytes Absolute: 0.5 10*3/uL (ref 0.1–1.0)
NEUTROS PCT: 57 % (ref 43–77)
Neutro Abs: 3.2 10*3/uL (ref 1.7–7.7)
PLATELETS: 153 10*3/uL (ref 150–400)
RBC: 4.53 MIL/uL (ref 4.22–5.81)
RDW: 12.3 % (ref 11.5–15.5)
WBC: 5.6 10*3/uL (ref 4.0–10.5)

## 2015-09-19 LAB — BASIC METABOLIC PANEL
BUN: 17 mg/dL (ref 7–25)
CHLORIDE: 104 mmol/L (ref 98–110)
CO2: 28 mmol/L (ref 20–31)
CREATININE: 1.25 mg/dL (ref 0.70–1.25)
Calcium: 9.1 mg/dL (ref 8.6–10.3)
Glucose, Bld: 86 mg/dL (ref 65–99)
Potassium: 4.1 mmol/L (ref 3.5–5.3)
Sodium: 140 mmol/L (ref 135–146)

## 2015-09-19 LAB — HEPATIC FUNCTION PANEL
ALBUMIN: 4.3 g/dL (ref 3.6–5.1)
ALT: 26 U/L (ref 9–46)
AST: 33 U/L (ref 10–35)
Alkaline Phosphatase: 48 U/L (ref 40–115)
Bilirubin, Direct: 0.2 mg/dL (ref <=0.2)
Indirect Bilirubin: 0.6 mg/dL (ref 0.2–1.2)
TOTAL PROTEIN: 7.1 g/dL (ref 6.1–8.1)
Total Bilirubin: 0.8 mg/dL (ref 0.2–1.2)

## 2015-09-19 LAB — LIPID PANEL
Cholesterol: 113 mg/dL — ABNORMAL LOW (ref 125–200)
HDL: 25 mg/dL — ABNORMAL LOW (ref >=40)
LDL CALC: 56 mg/dL (ref <130)
Total CHOL/HDL Ratio: 4.5 Ratio (ref <=5.0)
Triglycerides: 162 mg/dL — ABNORMAL HIGH (ref <150)
VLDL: 32 mg/dL — AB (ref <30)

## 2015-09-19 NOTE — Addendum Note (Signed)
Addended by: Eulis Foster on: 09/19/2015 07:32 AM   Modules accepted: Orders

## 2015-09-23 ENCOUNTER — Ambulatory Visit (INDEPENDENT_AMBULATORY_CARE_PROVIDER_SITE_OTHER): Payer: Medicare Other | Admitting: Nurse Practitioner

## 2015-09-23 ENCOUNTER — Encounter: Payer: Self-pay | Admitting: Nurse Practitioner

## 2015-09-23 VITALS — BP 142/76 | HR 65 | Ht 74.0 in | Wt 204.1 lb

## 2015-09-23 DIAGNOSIS — Z7901 Long term (current) use of anticoagulants: Secondary | ICD-10-CM | POA: Diagnosis not present

## 2015-09-23 DIAGNOSIS — D751 Secondary polycythemia: Secondary | ICD-10-CM

## 2015-09-23 DIAGNOSIS — E7800 Pure hypercholesterolemia, unspecified: Secondary | ICD-10-CM | POA: Diagnosis not present

## 2015-09-23 DIAGNOSIS — I119 Hypertensive heart disease without heart failure: Secondary | ICD-10-CM | POA: Diagnosis not present

## 2015-09-23 NOTE — Progress Notes (Signed)
CARDIOLOGY OFFICE NOTE  Date:  09/23/2015    Patrick Sosa Date of Birth: 08-25-1945 Medical Record [id number]  PCP:  Nyoka Cowden, MD  Cardiologist:  Former patient of Dr. Sherryl Barters    Chief Complaint  Patient presents with  . Hyperlipidemia  . Hypertension    6 month check - former patient of Dr. Sherryl Barters    History of Present Illness: Patrick Sosa is a 70 y.o. male who presents today for a follow up visit. Former patient of Dr. Sherryl Barters.   He has a history of essential hypertension, DVT, HLD, polycythemia and is on chronic Xarelto. He is no longer able to donate blood to the [facility] because he is on Xarelto.  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   Last seen back in October - felt to be doing well.  Comes back today. Here alone. Doing well. He is quite active. Walks regularly. No chest pain. Not short of breath. Has some "buldging" of the lower left leg - resolved with compression stockings. He is not having any issue with his Xarelto. No bleeding, bruising or falls. Tolerating his medicines. Recent labs noted. BP typically lower at home.   Past Medical History  Diagnosis Date  . Hypertension   . Polycythemia   . DVT (deep venous thrombosis) ([place])   . Hypercholesterolemia   . Dry cough   . DVT (deep venous thrombosis) [facility])     Past Surgical History  Procedure Laterality Date  . Tonsillectomy       Medications: Current Outpatient Prescriptions  Medication Sig Dispense Refill  . amLODipine (NORVASC) 5 MG tablet TAKE 1 TABLET(5 MG) BY MOUTH DAILY 90 tablet 1  . dimenhyDRINATE (DRAMAMINE) 50 MG tablet Take 75 mg by mouth at bedtime as needed. For sleep     . metoprolol tartrate (LOPRESSOR) 25 MG tablet TAKE 1 TABLET(25 MG) BY MOUTH TWICE DAILY 180 tablet 3  . pravastatin (PRAVACHOL) 20 MG tablet TAKE 1 TABLET BY MOUTH EVERY DAY 90 tablet 2  . spironolactone (ALDACTONE) 25 MG tablet TAKE 1 TABLET BY MOUTH EVERY  DAY 90 tablet 3  . tamsulosin (FLOMAX) 0.4 MG CAPS capsule TAKE 1 CAPSULE BY MOUTH EVERY DAY 90 capsule 1  . XARELTO 20 MG TABS tablet TAKE 1 TABLET BY MOUTH DAILY 180 tablet 0   No current facility-administered medications for this visit.    Allergies: No Known Allergies  Social History: The patient  reports that he quit smoking about 27 years ago. He has never used smokeless tobacco. He reports that he does not drink alcohol or use illicit drugs.   Family History: The patient's family history includes Alzheimer's disease in his mother; Cancer in his father; Heart attack in his father; Hypertension in his father and sister; Pancreatic cancer in his father.   Review of Systems: Please see the history of present illness.   Otherwise, the review of systems is positive for none.   All other systems are reviewed and negative.   Physical Exam: VS:  BP 142/76 mmHg  Pulse 65  Ht 6\' 2"  (1.88 m)  Wt 204 lb 1.9 oz (92.588 kg)  BMI 26.20 kg/m2 .  BMI Body mass index is 26.2 kg/(m^2).  Wt Readings from Last 3 Encounters:  09/23/15 204 lb 1.9 oz (92.588 kg)  04/01/15 208 lb (94.348 kg)  03/21/15 208 lb 3.2 oz (94.439 kg)   BP by me is down to 128/80.  General: Pleasant. Well  developed, well nourished and in no acute distress.  HEENT: Normal. Neck: Supple, no JVD, carotid bruits, or masses noted.  Cardiac: Regular rate and rhythm. No murmurs, rubs, or gallops. No edema.  Respiratory:  Lungs are clear to auscultation bilaterally with normal work of breathing.  GI: Soft and nontender.  MS: No deformity or atrophy. Gait and ROM intact. Skin: Warm and dry. Color is normal.  Neuro:  Strength and sensation are intact and no gross focal deficits noted.  Psych: Alert, appropriate and with normal affect.   LABORATORY DATA:  EKG:  EKG is ordered today. This demonstrates NSR.  Lab Results  Component Value Date   WBC 5.6 09/19/2015   HGB 15.9 09/19/2015   HCT 44.6 09/19/2015   PLT 153  09/19/2015   GLUCOSE 86 09/19/2015   CHOL 113* 09/19/2015   TRIG 162* 09/19/2015   HDL 25* 09/19/2015   LDLDIRECT 65.4 02/23/2012   LDLCALC 56 09/19/2015   ALT 26 09/19/2015   AST 33 09/19/2015   NA 140 09/19/2015   K 4.1 09/19/2015   CL 104 09/19/2015   CREATININE 1.25 09/19/2015   BUN 17 09/19/2015   CO2 28 09/19/2015   TSH 2.43 06/03/2010   PSA 1.77 06/03/2010   INR 1.1 ratio* 08/15/2009    BNP (last 3 results) No results for input(s): BNP in the last 8760 hours.  ProBNP (last 3 results) No results for input(s): PROBNP in the last 8760 hours.   Other Studies Reviewed Today:   Assessment/Plan: 1. HTN - BP looks good on current regimen.   2. Factor V Leiden deficiency on long-term anticoagulation with Xarelto. No problems noted.  3.  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 Polycythemia - last HGB was 15.9  5. HLD - on statin therapy - has had recent labs. Reviewed with patient today.   Current medicines are reviewed with the patient today.  The patient does not have concerns regarding medicines other than what has been noted above.  The following changes have been made:  See above.  Labs/ tests ordered today include:    Orders Placed This Encounter  Procedures  . EKG 12-Lead     Disposition:   FU with me in 6 months.   Patient is agreeable to this plan and will call if any problems develop in the interim.   Signed: Burtis Junes, RN, ANP-C 09/23/2015 9:01 AM  [facility] [address] [place], [place]  [address] Phone: [phone] Fax: [phone]

## 2015-09-23 NOTE — Patient Instructions (Addendum)
We will be checking the following labs today - NONE   Medication Instructions:    Continue with your current medicines.     Testing/Procedures To Be Arranged:  N/A  Follow-Up:   See me in 6 months    Other Special Instructions:   Keep up the good work!    If you need a refill on your cardiac medications before your next appointment, please call your pharmacy.   Call the [facility] office at [phone] if you have any questions, problems or concerns.      

## 2015-12-25 ENCOUNTER — Other Ambulatory Visit: Payer: Self-pay | Admitting: *Deleted

## 2015-12-25 MED ORDER — RIVAROXABAN 20 MG PO TABS: 20.0000 mg | ORAL_TABLET | Freq: Every day | ORAL | Status: DC

## 2016-01-04 ENCOUNTER — Other Ambulatory Visit: Payer: Self-pay | Admitting: Internal Medicine

## 2016-01-05 NOTE — Telephone Encounter (Signed)
Rx refill sent to pharmacy. 

## 2016-03-10 ENCOUNTER — Other Ambulatory Visit: Payer: Self-pay

## 2016-03-10 ENCOUNTER — Encounter: Payer: Self-pay | Admitting: Nurse Practitioner

## 2016-03-10 MED ORDER — PRAVASTATIN SODIUM 20 MG PO TABS: 20.0000 mg | ORAL_TABLET | Freq: Every day | ORAL | 2 refills | Status: DC

## 2016-03-24 ENCOUNTER — Encounter: Payer: Self-pay | Admitting: Nurse Practitioner

## 2016-03-24 ENCOUNTER — Ambulatory Visit (INDEPENDENT_AMBULATORY_CARE_PROVIDER_SITE_OTHER): Payer: Medicare Other | Admitting: Nurse Practitioner

## 2016-03-24 VITALS — BP 128/80 | HR 67 | Ht 74.0 in | Wt 196.1 lb

## 2016-03-24 DIAGNOSIS — D751 Secondary polycythemia: Secondary | ICD-10-CM | POA: Diagnosis not present

## 2016-03-24 DIAGNOSIS — E7800 Pure hypercholesterolemia, unspecified: Secondary | ICD-10-CM | POA: Diagnosis not present

## 2016-03-24 DIAGNOSIS — I119 Hypertensive heart disease without heart failure: Secondary | ICD-10-CM | POA: Diagnosis not present

## 2016-03-24 DIAGNOSIS — Z7901 Long term (current) use of anticoagulants: Secondary | ICD-10-CM | POA: Diagnosis not present

## 2016-03-24 LAB — BASIC METABOLIC PANEL
BUN: 22 mg/dL (ref 7–25)
CO2: 24 mmol/L (ref 20–31)
Calcium: 9.8 mg/dL (ref 8.6–10.3)
Chloride: 101 mmol/L (ref 98–110)
Creat: 1.49 mg/dL — ABNORMAL HIGH (ref 0.70–1.25)
Glucose, Bld: 93 mg/dL (ref 65–99)
Potassium: 4.2 mmol/L (ref 3.5–5.3)
Sodium: 137 mmol/L (ref 135–146)

## 2016-03-24 LAB — HEPATIC FUNCTION PANEL
ALT: 20 U/L (ref 9–46)
AST: 30 U/L (ref 10–35)
Albumin: 4.5 g/dL (ref 3.6–5.1)
Alkaline Phosphatase: 47 U/L (ref 40–115)
Bilirubin, Direct: 0.3 mg/dL — ABNORMAL HIGH (ref <=0.2)
Indirect Bilirubin: 0.6 mg/dL (ref 0.2–1.2)
Total Bilirubin: 0.9 mg/dL (ref 0.2–1.2)
Total Protein: 7.6 g/dL (ref 6.1–8.1)

## 2016-03-24 LAB — LIPID PANEL
Cholesterol: 117 mg/dL — ABNORMAL LOW (ref 125–200)
HDL: 26 mg/dL — ABNORMAL LOW (ref >=40)
LDL Cholesterol: 70 mg/dL (ref <130)
Total CHOL/HDL Ratio: 4.5 Ratio (ref <=5.0)
Triglycerides: 107 mg/dL (ref <150)
VLDL: 21 mg/dL (ref <30)

## 2016-03-24 LAB — CBC
HCT: 46.9 % (ref 38.5–50.0)
Hemoglobin: 16.5 g/dL (ref 13.2–17.1)
MCH: 34.5 pg — ABNORMAL HIGH (ref 27.0–33.0)
MCHC: 35.2 g/dL (ref 32.0–36.0)
MCV: 98.1 fL (ref 80.0–100.0)
MPV: 11.5 fL (ref 7.5–12.5)
Platelets: 181 10*3/uL (ref 140–400)
RBC: 4.78 MIL/uL (ref 4.20–5.80)
RDW: 12.2 % (ref 11.0–15.0)
WBC: 6.2 10*3/uL (ref 3.8–10.8)

## 2016-03-24 NOTE — Patient Instructions (Addendum)
We will be checking the following labs today - BMET, CBC, HPF, Lipids   Medication Instructions:    Continue with your current medicines.     Testing/Procedures To Be Arranged:  N/A  Follow-Up:   See me in 6 months with fasting labs    Other Special Instructions:   Keep up the good work!    If you need a refill on your cardiac medications before your next appointment, please call your pharmacy.   Call the [place] office at [phone] if you have any questions, problems or concerns.

## 2016-03-24 NOTE — Progress Notes (Signed)
CARDIOLOGY OFFICE NOTE  Date:  03/24/2016    Patrick Sosa Date of Birth: March 28, 1946 Medical Record [id number]  PCP:  Nyoka Cowden, MD  Cardiologist:  Mare Ferrari  Chief Complaint  Patient presents with  . Hypertension    6 month check - former patient of Dr. Sherryl Barters    History of Present Illness: Patrick Sosa is a 70 y.o. male who presents today for a 6 month check. Former patient of Dr. Sherryl Barters.   He has a history of essential hypertension, DVT, HLD, polycythemia and is on chronic Xarelto. He is no longer able to donate blood to the [facility] because he is on Xarelto.  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   Last seen back in April and he was doing well.   Comes back today. Here alone. Doing very well. Walking daily. Has lost a little more weight - attributes to the past summer. No chest pain. Breathing is good. Has had a recent nose bleed - one time occurrence. Not dizzy or lightheaded. BP is good at home. He is happy with how he is doing.    Past Medical History:  Diagnosis Date  . Chronic anticoagulation   . Dry cough   . DVT (deep venous thrombosis) ([place])   . Factor V deficiency ([place])   . Hypercholesterolemia   . Hypertension   . Polycythemia     Past Surgical History:  Procedure Laterality Date  . TONSILLECTOMY       Medications: Current Outpatient Prescriptions  Medication Sig Dispense Refill  . amLODipine (NORVASC) 5 MG tablet TAKE 1 TABLET(5 MG) BY MOUTH DAILY 90 tablet 1  . dimenhyDRINATE (DRAMAMINE) 50 MG tablet Take 75 mg by mouth at bedtime as needed. For sleep     . metoprolol tartrate (LOPRESSOR) 25 MG tablet TAKE 1 TABLET(25 MG) BY MOUTH TWICE DAILY 180 tablet 3  . pravastatin (PRAVACHOL) 20 MG tablet Take 1 tablet (20 mg total) by mouth daily. 90 tablet 2  . rivaroxaban (XARELTO) 20 MG TABS tablet Take 1 tablet (20 mg total) by mouth daily. 180 tablet 1  . spironolactone (ALDACTONE) 25 MG  tablet TAKE 1 TABLET BY MOUTH EVERY DAY 90 tablet 3  . tamsulosin (FLOMAX) 0.4 MG CAPS capsule TAKE 1 CAPSULE BY MOUTH EVERY DAY 90 capsule 0   No current facility-administered medications for this visit.     Allergies: No Known Allergies  Social History: The patient  reports that he quit smoking about 28 years ago. He has never used smokeless tobacco. He reports that he does not drink alcohol or use drugs.   Family History: The patient's family history includes Alzheimer's disease in his mother; Cancer in his father; Heart attack in his father; Hypertension in his father and sister; Pancreatic cancer in his father.   Review of Systems: Please see the history of present illness.   Otherwise, the review of systems is positive for none.   All other systems are reviewed and negative.   Physical Exam: VS:  BP 128/80   Pulse 67   Ht 6\' 2"  (1.88 m)   Wt 196 lb 1.9 oz (89 kg)   SpO2 96% Comment: at rest  BMI 25.18 kg/m  .  BMI Body mass index is 25.18 kg/m.  Wt Readings from Last 3 Encounters:  03/24/16 196 lb 1.9 oz (89 kg)  09/23/15 204 lb 1.9 oz (92.6 kg)  04/01/15 208 lb (94.3 kg)    General:  Pleasant. Well developed, well nourished and in no acute distress.   HEENT: Normal.  Neck: Supple, no JVD, carotid bruits, or masses noted.  Cardiac: Regular rate and rhythm. No murmurs, rubs, or gallops. No edema.  Respiratory:  Lungs are clear to auscultation bilaterally with normal work of breathing.  GI: Soft and nontender.  MS: No deformity or atrophy. Gait and ROM intact.  Skin: Warm and dry. Color is normal.  Neuro:  Strength and sensation are intact and no gross focal deficits noted.  Psych: Alert, appropriate and with normal affect.   LABORATORY DATA:  EKG:  EKG is not ordered today..  Lab Results  Component Value Date   WBC 5.6 09/19/2015   HGB 15.9 09/19/2015   HCT 44.6 09/19/2015   PLT 153 09/19/2015   GLUCOSE 86 09/19/2015   CHOL 113 (L) 09/19/2015   TRIG 162  (H) 09/19/2015   HDL 25 (L) 09/19/2015   LDLDIRECT 65.4 02/23/2012   LDLCALC 56 09/19/2015   ALT 26 09/19/2015   AST 33 09/19/2015   NA 140 09/19/2015   K 4.1 09/19/2015   CL 104 09/19/2015   CREATININE 1.25 09/19/2015   BUN 17 09/19/2015   CO2 28 09/19/2015   TSH 2.43 06/03/2010   PSA 1.77 06/03/2010   INR 1.1 ratio (H) 08/15/2009    BNP (last 3 results) No results for input(s): BNP in the last 8760 hours.  ProBNP (last 3 results) No results for input(s): PROBNP in the last 8760 hours.   Other Studies Reviewed Today:   Assessment/Plan: 1. HTN - BP looks good on current regimen.   2. Factor V Leiden deficiency on long-term anticoagulation with Xarelto. No problems noted.  3.  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 Polycythemia - last HGB was 15.9 0- rechecking today  5. HLD - on statin therapy - rechecking labs today  Current medicines are reviewed with the patient today.  The patient does not have concerns regarding medicines other than what has been noted above.  The following changes have been made:  See above.  Labs/ tests ordered today include:    Orders Placed This Encounter  Procedures  . Basic metabolic panel  . CBC  . Hepatic function panel  . Lipid panel     Disposition:   FU with me in 6 months.   Patient is agreeable to this plan and will call if any problems develop in the interim.   Signed: Burtis Junes, RN, ANP-C 03/24/2016 8:10 AM  [facility] [address] [place], [place]  [address] Phone: [phone] Fax: [phone]

## 2016-03-25 ENCOUNTER — Other Ambulatory Visit: Payer: Self-pay | Admitting: *Deleted

## 2016-03-25 DIAGNOSIS — R748 Abnormal levels of other serum enzymes: Secondary | ICD-10-CM

## 2016-04-05 ENCOUNTER — Other Ambulatory Visit: Payer: Self-pay | Admitting: Internal Medicine

## 2016-04-12 ENCOUNTER — Telehealth: Payer: Self-pay | Admitting: Nurse Practitioner

## 2016-04-12 NOTE — Telephone Encounter (Signed)
S/w pt stated nose has been bleeding frequently. Stated bleed before ov appointment.  Bleed 3 times over the weekend.  Does stop.  Stated pt can try plain Claritin to dry up nose. If keeps continuing will have to go to ER or ENT to get cauterized. Will Send to Truitt Merle, NP, to [place].

## 2016-04-12 NOTE — Telephone Encounter (Signed)
New Message  Pt c/o medication issue:  1. Name of Medication: Xarelto   2. How are you currently taking this medication (dosage and times per day)? 20mg    3. Are you having a reaction (difficulty breathing--STAT)? Per pt having noses bleeds  4. What is your medication issue? Per pt call requesting to speak with RN. Pt states he has been having nose bleeds recently and would like to discuss with RN. Please call back to discuss

## 2016-04-13 NOTE — Telephone Encounter (Signed)
Needs  to see ENT.

## 2016-04-13 NOTE — Telephone Encounter (Signed)
Pt is aware if nose bleeds continue to see ENT as dicussed yesterday.

## 2016-04-14 ENCOUNTER — Encounter: Payer: Self-pay | Admitting: Internal Medicine

## 2016-04-14 ENCOUNTER — Ambulatory Visit (INDEPENDENT_AMBULATORY_CARE_PROVIDER_SITE_OTHER): Payer: Medicare Other | Admitting: Internal Medicine

## 2016-04-14 VITALS — BP 114/80 | HR 65 | Temp 98.3°F | Resp 18 | Ht 72.5 in | Wt 196.0 lb

## 2016-04-14 DIAGNOSIS — E7800 Pure hypercholesterolemia, unspecified: Secondary | ICD-10-CM | POA: Diagnosis not present

## 2016-04-14 DIAGNOSIS — Z0000 Encounter for general adult medical examination without abnormal findings: Secondary | ICD-10-CM

## 2016-04-14 DIAGNOSIS — Z23 Encounter for immunization: Secondary | ICD-10-CM | POA: Diagnosis not present

## 2016-04-14 DIAGNOSIS — I10 Essential (primary) hypertension: Secondary | ICD-10-CM | POA: Diagnosis not present

## 2016-04-14 NOTE — Progress Notes (Signed)
Subjective:    Patient ID: Patrick Sosa, male    DOB: Aug 25, 1945, 69 y.o.   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   Medical problems include history of hypertension, dyslipidemia, and polycythemia.  He has a history recurrent DVT with factor V deficiency.  Remains on chronic anticoagulation.  No concerns or complaints.  Recent laboratory studies reviewed.  He did have screening colonoscopy in 2010 that revealed  significant diverticular disease, as well as internal hemorrhoids. Now retired for 3 years  Past Medical History:  Diagnosis Date  . Chronic anticoagulation   . Dry cough   . DVT (deep venous thrombosis) ([place])   . Factor V deficiency ([place])   . Hypercholesterolemia   . Hypertension   . Polycythemia     Social History   Social History  . Marital status: Married    Spouse name: N/A  . Number of children: N/A  . Years of education: N/A   Occupational History  . Not on file.   Social History Main Topics  . Smoking status: Former Smoker    Quit date: 10/12/1987  . Smokeless tobacco: Never Used  . Alcohol use No     Comment: rarely  . Drug use: No  . Sexual activity: Yes   Other Topics Concern  . Not on file   Social History Narrative  . No narrative on file    Past Surgical History:  Procedure Laterality Date  . TONSILLECTOMY      Family History  Problem Relation Age of Onset  . Pancreatic cancer Father   . Hypertension Father   . Heart attack Father   . Cancer Father   . Alzheimer's disease Mother   . Hypertension Sister     No Known Allergies  Current Outpatient Prescriptions on File Prior to Visit  Medication Sig Dispense Refill  . amLODipine (NORVASC) 5 MG tablet TAKE 1 TABLET(5 MG) BY MOUTH DAILY 90 tablet 1  . dimenhyDRINATE (DRAMAMINE) 50 MG tablet Take 75 mg by mouth at bedtime as needed. For sleep     . metoprolol tartrate (LOPRESSOR) 25 MG tablet TAKE 1 TABLET(25 MG) BY MOUTH  TWICE DAILY 180 tablet 3  . pravastatin (PRAVACHOL) 20 MG tablet Take 1 tablet (20 mg total) by mouth daily. 90 tablet 2  . rivaroxaban (XARELTO) 20 MG TABS tablet Take 1 tablet (20 mg total) by mouth daily. 180 tablet 1  . spironolactone (ALDACTONE) 25 MG tablet TAKE 1 TABLET BY MOUTH EVERY DAY 90 tablet 3  . tamsulosin (FLOMAX) 0.4 MG CAPS capsule TAKE 1 CAPSULE BY MOUTH EVERY DAY 90 capsule 0   No current facility-administered medications on file prior to visit.     BP 114/80 (BP Location: Left Arm, Patient Position: Sitting, Cuff Size: Normal)   Pulse 65   Temp 98.3 F (36.8 C) (Oral)   Resp 18   Ht 6' 0.5" (1.842 m)   Wt 196 lb (88.9 kg)   SpO2 98%   BMI 26.22 kg/m   Medicare wellness  1. Risk factors, based on past  M,S,F history.  Risk factors include hypertension, and dyslipidemia.  He has remote tobacco history, but discontinued at age 79.  He has a history of polycythemia  2.  Physical activities: No exercise limitation.  Walks his dog daily.  Very active with yard work  3.  Depression/mood: No history depression or mood disorder  4.  Hearing: No deficits  5.  ADL's: Independent in all aspects of daily living  6.  Fall risk: Low  7.  Home safety: No problems identified  8.  Height weight, and visual acuity; height and weight stable.  Cataract extraction surgery in January 2016.  Does obtain an annual eye, examination.  Each November.  9.  Counseling: Heart healthy diet regular exercise encouraged  10. Lab orders based on risk factors: Laboratory profile including lipid panel reviewed  11. Referral : Not appropriate at this time  12. Care plan: Continue heart healthy diet regular exercise.  Patient appears to be near ideal body weight  13. Cognitive assessment: Alert and oriented with normal affect.  No cognitive dysfunction  14. Screening: Eye examination in November.  Repeat colonoscopy 2020  15. Provider List Update: Cardiology and GI as well as  ophthalmology    Review of Systems  Constitutional: Negative for activity change, appetite change, chills, fatigue and fever.  HENT: Positive for tinnitus. Negative for congestion, dental problem, ear pain, hearing loss, mouth sores, rhinorrhea, sinus pressure, sneezing, trouble swallowing and voice change.   Eyes: Negative for photophobia, pain, redness and visual disturbance.  Respiratory: Positive for cough. Negative for apnea, choking, chest tightness, shortness of breath and wheezing.   Cardiovascular: Negative for chest pain, palpitations and leg swelling.  Gastrointestinal: Negative for abdominal distention, abdominal pain, anal bleeding, blood in stool, constipation, diarrhea, nausea, rectal pain and vomiting.  Genitourinary: Negative for decreased urine volume, difficulty urinating, discharge, dysuria, flank pain, frequency, genital sores, hematuria, penile swelling, scrotal swelling, testicular pain and urgency.  Musculoskeletal: Negative for arthralgias, back pain, gait problem, joint swelling, myalgias, neck pain and neck stiffness.  Skin: Negative for color change, rash and wound.  Neurological: Negative for dizziness, tremors, seizures, syncope, facial asymmetry, speech difficulty, weakness, light-headedness, numbness and headaches.  Hematological: Negative for adenopathy. Does not bruise/bleed easily.  Psychiatric/Behavioral: Negative for agitation, behavioral problems, confusion, decreased concentration, dysphoric mood, hallucinations, self-injury, sleep disturbance and suicidal ideas. The patient is not nervous/anxious.        Objective:   Physical Exam  Constitutional: He appears well-developed and well-nourished.  HENT:  Head: Normocephalic and atraumatic.  Right Ear: External ear normal.  Left Ear: External ear normal.  Nose: Nose normal.  Mouth/Throat: Oropharynx is clear and moist.  Eyes: Conjunctivae and EOM are normal. Pupils are equal, round, and reactive to  light. No scleral icterus.  Neck: Normal range of motion. Neck supple. No JVD present. No thyromegaly present.  Cardiovascular: Regular rhythm, normal heart sounds and intact distal pulses.  Exam reveals no gallop and no friction rub.   No murmur heard. Pedal pulses  diminished on the left  Pulmonary/Chest: Effort normal and breath sounds normal. No respiratory distress. He has no wheezes. He has no rales. He exhibits no tenderness.  Abdominal: Soft. Bowel sounds are normal. He exhibits no distension and no mass. There is no tenderness.  Genitourinary: Penis normal. Rectal exam shows guaiac negative stool.  Genitourinary Comments: Prostatic enlargement  Musculoskeletal: Normal range of motion. He exhibits no edema or tenderness.  Lymphadenopathy:    He has no cervical adenopathy.  Neurological: He is alert. He has normal reflexes. No cranial nerve deficit. Coordination normal.  Skin: Skin is warm and dry. No rash noted.  Psychiatric: He has a normal mood and affect. His behavior is normal.          Assessment & Plan:   Preventive health examination. Hypertension.  Well controlled  on present regimen.Frequent low normal blood pressure readings.  Will discontinue diuretic therapy Dyslipidemia.  Continue statin therapy Recurrent DVT/factor V Leiden deficiency.  Continue chronic anticoagulation.  Recurrent epistaxis right nares for the past few weeks.  May need ENT referral History of polycythemia    Recheck one year  Nyoka Cowden

## 2016-04-14 NOTE — Patient Instructions (Addendum)
Discontinue spironolactone  Limit your sodium (Salt) intake  Please check your blood pressure on a regular basis.  If it is consistently greater than 150/90, please make an office appointment.    It is important that you exercise regularly, at least 20 minutes 3 to 4 times per week.  If you develop chest pain or shortness of breath seek  medical attention.  Return in one year for follow-up

## 2016-04-14 NOTE — Progress Notes (Signed)
Pre visit review using our clinic review tool, if applicable. No additional management support is needed unless otherwise documented below in the visit note. 

## 2016-04-21 ENCOUNTER — Other Ambulatory Visit: Payer: Medicare Other | Admitting: *Deleted

## 2016-04-21 DIAGNOSIS — R748 Abnormal levels of other serum enzymes: Secondary | ICD-10-CM

## 2016-04-21 LAB — BASIC METABOLIC PANEL
BUN: 14 mg/dL (ref 7–25)
CO2: 27 mmol/L (ref 20–31)
Calcium: 9.1 mg/dL (ref 8.6–10.3)
Chloride: 104 mmol/L (ref 98–110)
Creat: 1.26 mg/dL — ABNORMAL HIGH (ref 0.70–1.25)
Glucose, Bld: 86 mg/dL (ref 65–99)
Potassium: 3.6 mmol/L (ref 3.5–5.3)
Sodium: 140 mmol/L (ref 135–146)

## 2016-04-22 ENCOUNTER — Encounter: Payer: Self-pay | Admitting: Nurse Practitioner

## 2016-04-22 ENCOUNTER — Other Ambulatory Visit: Payer: Self-pay

## 2016-04-22 MED ORDER — AMLODIPINE BESYLATE 5 MG PO TABS: 5.0000 mg | ORAL_TABLET | Freq: Every day | ORAL | 3 refills | Status: DC

## 2016-04-26 ENCOUNTER — Other Ambulatory Visit: Payer: Self-pay | Admitting: *Deleted

## 2016-04-26 ENCOUNTER — Telehealth: Payer: Self-pay | Admitting: *Deleted

## 2016-04-26 DIAGNOSIS — E876 Hypokalemia: Secondary | ICD-10-CM

## 2016-04-26 NOTE — Telephone Encounter (Signed)
S/w pt according to pt's email Cecille Rubin responded back to pt that pt needed to come in two weeks for repeat bmet for hypokalemia. Orders in and linked, appointment made.

## 2016-05-10 ENCOUNTER — Other Ambulatory Visit: Payer: Medicare Other | Admitting: *Deleted

## 2016-05-10 DIAGNOSIS — E876 Hypokalemia: Secondary | ICD-10-CM

## 2016-05-10 LAB — BASIC METABOLIC PANEL
BUN: 18 mg/dL (ref 7–25)
CO2: 30 mmol/L (ref 20–31)
Calcium: 9.4 mg/dL (ref 8.6–10.3)
Chloride: 104 mmol/L (ref 98–110)
Creat: 1.14 mg/dL (ref 0.70–1.25)
Glucose, Bld: 91 mg/dL (ref 65–99)
Potassium: 4.2 mmol/L (ref 3.5–5.3)
Sodium: 140 mmol/L (ref 135–146)

## 2016-05-17 ENCOUNTER — Other Ambulatory Visit: Payer: Self-pay | Admitting: *Deleted

## 2016-05-17 MED ORDER — METOPROLOL TARTRATE 25 MG PO TABS: 25.0000 mg | ORAL_TABLET | Freq: Two times a day (BID) | ORAL | 3 refills | Status: DC

## 2016-06-21 HISTORY — PX: CATARACT EXTRACTION, BILATERAL: SHX1313

## 2016-07-04 ENCOUNTER — Other Ambulatory Visit: Payer: Self-pay | Admitting: Internal Medicine

## 2016-09-10 ENCOUNTER — Encounter: Payer: Self-pay | Admitting: Nurse Practitioner

## 2016-09-14 ENCOUNTER — Telehealth: Payer: Self-pay | Admitting: Nurse Practitioner

## 2016-09-14 MED ORDER — SPIRONOLACTONE 25 MG PO TABS: 25.0000 mg | ORAL_TABLET | Freq: Every day | ORAL | 3 refills | Status: DC

## 2016-09-14 MED ORDER — AMLODIPINE BESYLATE 5 MG PO TABS: 5.0000 mg | ORAL_TABLET | Freq: Two times a day (BID) | ORAL | 3 refills | Status: DC

## 2016-09-14 NOTE — Telephone Encounter (Signed)
s/w pt is aware of Lori's recommendations, medication list updated.

## 2016-09-14 NOTE — Telephone Encounter (Signed)
New Message      Pt would like to come in today, he bp is to high 156/91 172/93 last night . (there are currently no openings on Lori schedule for today) He has appt 09/22/16

## 2016-09-14 NOTE — Telephone Encounter (Signed)
s/w pt bp has been elevated last couple of weeks.  160/90 in the am, 170/90 pm.  Started Aldactone (25 mg) daily.  S/w Cecille Rubin recommendations are as follows:  Keep record of Bp bring to office visit Increase Amlodipine (5 mg ) bid Bring bp cuff to next visit.   Will call pt back.

## 2016-09-21 NOTE — Progress Notes (Signed)
CARDIOLOGY OFFICE NOTE  Date:  09/22/2016    Patrick Sosa Date of Birth: 01-04-1946 Medical Record #[id number]  PCP:  Nyoka Cowden, MD  Cardiologist:  Servando Snare    Chief Complaint  Patient presents with  . Hypertension    Follow up visit     History of Present Illness: Patrick Sosa is a 71 y.o. male who presents today for a 6 month check. Former patient of Dr. Sherryl Barters. He follows with me.   He has a history of essential hypertension, DVT, HLD, polycythemia and is on chronic Xarelto. He is no longer able to donate blood to the [employer] because he is on Xarelto. 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   Last seen back in October and he was doing well.   Called last week - BP was running high - his medicines were adjusted. He was asked to keep his follow up.   Comes back today. Here alone. Notes that he had gotten off track with walking - probably getting too much salt as well - this was probably the trigger for recent increase in his BP. BP has come down with the increase in Norvasc. He feels good. No chest pain. Not short of breath. Not dizzy or lightheaded. No palpitations that bother him. No bleeding/bruising issues. He is back walking now. He is happy with how he is doing.   Past Medical History:  Diagnosis Date  . Chronic anticoagulation   . Dry cough   . DVT (deep venous thrombosis) ([place])   . Factor V deficiency ([place])   . Hypercholesterolemia   . Hypertension   . Polycythemia     Past Surgical History:  Procedure Laterality Date  . TONSILLECTOMY       Medications: Current Outpatient Prescriptions  Medication Sig Dispense Refill  . amLODipine (NORVASC) 5 MG tablet Take 1 tablet (5 mg total) by mouth 2 (two) times daily. 90 tablet 3  . dimenhyDRINATE (DRAMAMINE) 50 MG tablet Take 75 mg by mouth at bedtime as needed. For sleep     . metoprolol tartrate (LOPRESSOR) 25 MG tablet Take 1 tablet (25 mg total) by mouth 2  (two) times daily. 180 tablet 3  . pravastatin (PRAVACHOL) 20 MG tablet Take 1 tablet (20 mg total) by mouth daily. 90 tablet 2  . rivaroxaban (XARELTO) 20 MG TABS tablet Take 1 tablet (20 mg total) by mouth daily. 180 tablet 1  . spironolactone (ALDACTONE) 25 MG tablet Take 1 tablet (25 mg total) by mouth daily. 90 tablet 3  . tamsulosin (FLOMAX) 0.4 MG CAPS capsule TAKE 1 CAPSULE BY MOUTH EVERY DAY 90 capsule 0   No current facility-administered medications for this visit.     Allergies: No Known Allergies  Social History: The patient  reports that he quit smoking about 28 years ago. He has never used smokeless tobacco. He reports that he does not drink alcohol or use drugs.   Family History: The patient's family history includes Alzheimer's disease in his mother; Cancer in his father; Heart attack in his father; Hypertension in his father and sister; Pancreatic cancer in his father.   Review of Systems: Please see the history of present illness.   Otherwise, the review of systems is positive for none.   All other systems are reviewed and negative.   Physical Exam: VS:  BP 130/80   Pulse 65   Ht 6\' 2"  (1.88 m)   Wt 197 lb 6.4 oz (  89.5 kg)   BMI 25.34 kg/m  .  BMI Body mass index is 25.34 kg/m.  Wt Readings from Last 3 Encounters:  09/22/16 197 lb 6.4 oz (89.5 kg)  04/14/16 196 lb (88.9 kg)  03/24/16 196 lb 1.9 oz (89 kg)    General: Pleasant. Well developed, well nourished and in no acute distress.   HEENT: Normal.  Neck: Supple, no JVD, carotid bruits, or masses noted.  Cardiac: Regular rate and rhythm. No murmurs, rubs, or gallops. No edema.  Respiratory:  Lungs are clear to auscultation bilaterally with normal work of breathing.  GI: Soft and nontender.  MS: No deformity or atrophy. Gait and ROM intact.  Skin: Warm and dry. Color is normal.  Neuro:  Strength and sensation are intact and no gross focal deficits noted.  Psych: Alert, appropriate and with normal  affect.   LABORATORY DATA:  EKG:  EKG is ordered today.  This shows NSR with PVC.   Lab Results  Component Value Date   WBC 6.2 03/24/2016   HGB 16.5 03/24/2016   HCT 46.9 03/24/2016   PLT 181 03/24/2016   GLUCOSE 91 05/10/2016   CHOL 117 (L) 03/24/2016   TRIG 107 03/24/2016   HDL 26 (L) 03/24/2016   LDLDIRECT 65.4 02/23/2012   LDLCALC 70 03/24/2016   ALT 20 03/24/2016   AST 30 03/24/2016   NA 140 05/10/2016   K 4.2 05/10/2016   CL 104 05/10/2016   CREATININE 1.14 05/10/2016   BUN 18 05/10/2016   CO2 30 05/10/2016   TSH 2.43 06/03/2010   PSA 1.77 06/03/2010   INR 1.1 ratio (H) 08/15/2009    BNP (last 3 results) No results for input(s): BNP in the last 8760 hours.  ProBNP (last 3 results) No results for input(s): PROBNP in the last 8760 hours.   Other Studies Reviewed Today:   Assessment/Plan:  1. HTN - recent elevation - meds adjusted - suspect this may be more related to salt - will have him continue to monitor - he is using a wrist cuff - it runs a little higher than BP here today.   2. Factor V Leiden deficiency on long-term anticoagulation with Xarelto. No problems noted.  3. 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 Polycythemia -  rechecking today  5. HLD - on statin therapy - rechecking labs today  6. PVC - no symptoms noted.   Current medicines are reviewed with the patient today.  The patient does not have concerns regarding medicines other than what has been noted above.  The following changes have been made:  See above.  Labs/ tests ordered today include:    Orders Placed This Encounter  Procedures  . Basic metabolic panel  . CBC  . Hepatic function panel  . Lipid panel  . EKG 12-Lead     Disposition:   FU with me in 6 months with fasting labs.    Patient is agreeable to this plan and will call if any problems develop in the interim.   SignedTruitt Merle, NP  09/22/2016 8:12 AM  [facility] [address] [place], [place]  [address] Phone: [phone] Fax: [phone]

## 2016-09-22 ENCOUNTER — Ambulatory Visit (INDEPENDENT_AMBULATORY_CARE_PROVIDER_SITE_OTHER): Payer: Medicare Other | Admitting: Nurse Practitioner

## 2016-09-22 ENCOUNTER — Telehealth: Payer: Self-pay | Admitting: Nurse Practitioner

## 2016-09-22 ENCOUNTER — Encounter: Payer: Self-pay | Admitting: Nurse Practitioner

## 2016-09-22 VITALS — BP 130/80 | HR 65 | Ht 74.0 in | Wt 197.4 lb

## 2016-09-22 DIAGNOSIS — D751 Secondary polycythemia: Secondary | ICD-10-CM | POA: Diagnosis not present

## 2016-09-22 DIAGNOSIS — Z7901 Long term (current) use of anticoagulants: Secondary | ICD-10-CM

## 2016-09-22 DIAGNOSIS — E7800 Pure hypercholesterolemia, unspecified: Secondary | ICD-10-CM | POA: Diagnosis not present

## 2016-09-22 DIAGNOSIS — I119 Hypertensive heart disease without heart failure: Secondary | ICD-10-CM

## 2016-09-22 LAB — HEPATIC FUNCTION PANEL
ALT: 18 IU/L (ref 0–44)
AST: 29 IU/L (ref 0–40)
Albumin: 4.6 g/dL (ref 3.6–4.8)
Alkaline Phosphatase: 66 IU/L (ref 39–117)
Bilirubin Total: 0.7 mg/dL (ref 0.0–1.2)
Bilirubin, Direct: 0.16 mg/dL (ref 0.00–0.40)
Total Protein: 7.8 g/dL (ref 6.0–8.5)

## 2016-09-22 LAB — BASIC METABOLIC PANEL
BUN/Creatinine Ratio: 17 (ref 10–24)
BUN: 23 mg/dL (ref 8–27)
CO2: 25 mmol/L (ref 18–29)
Calcium: 9.6 mg/dL (ref 8.6–10.2)
Chloride: 99 mmol/L (ref 96–106)
Creatinine, Ser: 1.38 mg/dL — ABNORMAL HIGH (ref 0.76–1.27)
GFR calc Af Amer: 60 mL/min/{1.73_m2} (ref >59)
GFR calc non Af Amer: 52 mL/min/{1.73_m2} — ABNORMAL LOW (ref >59)
Glucose: 93 mg/dL (ref 65–99)
Potassium: 4.5 mmol/L (ref 3.5–5.2)
Sodium: 140 mmol/L (ref 134–144)

## 2016-09-22 LAB — LIPID PANEL
Chol/HDL Ratio: 4.1 ratio (ref 0.0–5.0)
Cholesterol, Total: 106 mg/dL (ref 100–199)
HDL: 26 mg/dL — ABNORMAL LOW (ref >39)
LDL Calculated: 57 mg/dL (ref 0–99)
Triglycerides: 116 mg/dL (ref 0–149)
VLDL Cholesterol Cal: 23 mg/dL (ref 5–40)

## 2016-09-22 LAB — CBC
Hematocrit: 46.3 % (ref 37.5–51.0)
Hemoglobin: 16.4 g/dL (ref 13.0–17.7)
MCH: 35.4 pg — ABNORMAL HIGH (ref 26.6–33.0)
MCHC: 35.4 g/dL (ref 31.5–35.7)
MCV: 100 fL — ABNORMAL HIGH (ref 79–97)
Platelets: 170 10*3/uL (ref 150–379)
RBC: 4.63 x10E6/uL (ref 4.14–5.80)
RDW: 12.3 % (ref 12.3–15.4)
WBC: 4.9 10*3/uL (ref 3.4–10.8)

## 2016-09-22 MED ORDER — AMLODIPINE BESYLATE 5 MG PO TABS: 5.0000 mg | ORAL_TABLET | Freq: Two times a day (BID) | ORAL | 3 refills | Status: DC

## 2016-09-22 MED ORDER — SPIRONOLACTONE 25 MG PO TABS: 25.0000 mg | ORAL_TABLET | Freq: Every day | ORAL | 3 refills | Status: DC

## 2016-09-22 NOTE — Telephone Encounter (Signed)
Follow Up:    Please verify the directions for pt's Amlodipine.

## 2016-09-22 NOTE — Patient Instructions (Addendum)
We will be checking the following labs today - BMET, CBC, HPF, Lipids   Medication Instructions:    Continue with your current medicines.   I sent in your refills today.     Testing/Procedures To Be Arranged:  N/A  Follow-Up:   See [place] back in 6 months with fasting labs.     Other Special Instructions:   N/A    If you need a refill on your cardiac medications before your next appointment, please call your pharmacy.   Call the [place] office at [phone] if you have any questions, problems or concerns.

## 2016-09-22 NOTE — Telephone Encounter (Signed)
s/w pharmacist Nathanial Millman apologized sent in #[phone] on amlodipine, #180 was to be sent in.  Medication was clarified today.

## 2016-09-23 ENCOUNTER — Telehealth: Payer: Self-pay | Admitting: *Deleted

## 2016-09-23 NOTE — Telephone Encounter (Signed)
-----   Message from Burtis Junes, NP sent at 09/22/2016  4:01 PM EDT ----- Ok to report. Labs are stable - little mild kidney impairment - make sure drinking enough water - avoid NSAIDs - would continue on current regimen. Will plan to recheck on return.

## 2016-09-23 NOTE — Telephone Encounter (Signed)
Pt notified of lab results by phone with verbal understanding. Pt states he seldomly uses NSAID's. Pt advised to increase H2o. Pt is agreeable to plan of care and thanked me for my call today.

## 2016-10-01 ENCOUNTER — Other Ambulatory Visit: Payer: Self-pay | Admitting: Internal Medicine

## 2016-12-09 ENCOUNTER — Other Ambulatory Visit: Payer: Self-pay | Admitting: Nurse Practitioner

## 2016-12-20 ENCOUNTER — Other Ambulatory Visit: Payer: Self-pay | Admitting: Nurse Practitioner

## 2016-12-26 ENCOUNTER — Other Ambulatory Visit: Payer: Self-pay | Admitting: Internal Medicine

## 2017-03-10 ENCOUNTER — Encounter: Payer: Self-pay | Admitting: Internal Medicine

## 2017-03-23 ENCOUNTER — Other Ambulatory Visit: Payer: Self-pay | Admitting: Internal Medicine

## 2017-04-04 ENCOUNTER — Encounter: Payer: Self-pay | Admitting: Nurse Practitioner

## 2017-04-04 ENCOUNTER — Ambulatory Visit (INDEPENDENT_AMBULATORY_CARE_PROVIDER_SITE_OTHER): Payer: Medicare Other | Admitting: Nurse Practitioner

## 2017-04-04 VITALS — BP 118/76 | HR 57 | Ht 74.0 in | Wt 199.8 lb

## 2017-04-04 DIAGNOSIS — E7800 Pure hypercholesterolemia, unspecified: Secondary | ICD-10-CM

## 2017-04-04 DIAGNOSIS — I119 Hypertensive heart disease without heart failure: Secondary | ICD-10-CM | POA: Diagnosis not present

## 2017-04-04 DIAGNOSIS — Z79899 Other long term (current) drug therapy: Secondary | ICD-10-CM

## 2017-04-04 DIAGNOSIS — Z7901 Long term (current) use of anticoagulants: Secondary | ICD-10-CM

## 2017-04-04 LAB — BASIC METABOLIC PANEL
BUN/Creatinine Ratio: 13 (ref 10–24)
BUN: 19 mg/dL (ref 8–27)
CO2: 24 mmol/L (ref 20–29)
Calcium: 9.5 mg/dL (ref 8.6–10.2)
Chloride: 100 mmol/L (ref 96–106)
Creatinine, Ser: 1.47 mg/dL — ABNORMAL HIGH (ref 0.76–1.27)
GFR calc Af Amer: 56 mL/min/{1.73_m2} — ABNORMAL LOW (ref >59)
GFR calc non Af Amer: 48 mL/min/{1.73_m2} — ABNORMAL LOW (ref >59)
Glucose: 88 mg/dL (ref 65–99)
Potassium: 4.7 mmol/L (ref 3.5–5.2)
Sodium: 137 mmol/L (ref 134–144)

## 2017-04-04 LAB — HEPATIC FUNCTION PANEL
ALT: 24 IU/L (ref 0–44)
AST: 35 IU/L (ref 0–40)
Albumin: 4.6 g/dL (ref 3.6–4.8)
Alkaline Phosphatase: 56 IU/L (ref 39–117)
Bilirubin Total: 0.9 mg/dL (ref 0.0–1.2)
Bilirubin, Direct: 0.2 mg/dL (ref 0.00–0.40)
Total Protein: 7.7 g/dL (ref 6.0–8.5)

## 2017-04-04 LAB — CBC
Hematocrit: 44.3 % (ref 37.5–51.0)
Hemoglobin: 16.1 g/dL (ref 13.0–17.7)
MCH: 35.5 pg — ABNORMAL HIGH (ref 26.6–33.0)
MCHC: 36.3 g/dL — ABNORMAL HIGH (ref 31.5–35.7)
MCV: 98 fL — ABNORMAL HIGH (ref 79–97)
Platelets: 164 10*3/uL (ref 150–379)
RBC: 4.53 x10E6/uL (ref 4.14–5.80)
RDW: 12.1 % — ABNORMAL LOW (ref 12.3–15.4)
WBC: 4.8 10*3/uL (ref 3.4–10.8)

## 2017-04-04 LAB — LIPID PANEL
Chol/HDL Ratio: 4.2 ratio (ref 0.0–5.0)
Cholesterol, Total: 114 mg/dL (ref 100–199)
HDL: 27 mg/dL — ABNORMAL LOW (ref >39)
LDL Calculated: 58 mg/dL (ref 0–99)
Triglycerides: 143 mg/dL (ref 0–149)
VLDL Cholesterol Cal: 29 mg/dL (ref 5–40)

## 2017-04-04 NOTE — Progress Notes (Signed)
CARDIOLOGY OFFICE NOTE  Date:  04/04/2017    Patrick Sosa Date of Birth: 1945-10-05 Medical Record #[id number]  PCP:  Marletta Lor, MD  Cardiologist:  Servando Snare   Chief Complaint  Patient presents with  . Hypertension  . Hyperlipidemia    6 month check.    History of Present Illness: Patrick Sosa is a 71 y.o. male who presents today for a 6 month check. Former patient of Dr. Sherryl Barters. He primarily follows with me.   He has a history of essential hypertension, DVT, HLD, polycythemia and is on chronic Xarelto. He is no longer able to donate blood to the [facility] because he is on Xarelto. 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   Last seen back in April and he was doing well but BP had been running higher and we adjusted his medicines.   Comes back today. Here alone. Doing well. BP is good at home. He feels well. No chest pain. Breathing is good. Not dizzy or lightheaded. Tolerating his medicines without issue. No bleeding/bruising issues. He feels like he is doing well. Exercises regularly.   Past Medical History:  Diagnosis Date  . Chronic anticoagulation   . Dry cough   . DVT (deep venous thrombosis) ([place])   . Factor V deficiency ([place])   . Hypercholesterolemia   . Hypertension   . Polycythemia     Past Surgical History:  Procedure Laterality Date  . TONSILLECTOMY       Medications: Current Meds  Medication Sig  . amLODipine (NORVASC) 5 MG tablet one tablet daily  . metoprolol tartrate (LOPRESSOR) 25 MG tablet Take 25 mg by mouth 2 (two) times daily.   . pravastatin (PRAVACHOL) 20 MG tablet one tablet daily  . spironolactone (ALDACTONE) 25 MG tablet Take 25 mg by mouth daily.   . tamsulosin (FLOMAX) 0.4 MG CAPS capsule one tablet daily  . XARELTO 20 MG TABS tablet Take 20 mg by mouth daily with supper.   . [DISCONTINUED] amLODipine (NORVASC) 5 MG tablet Take 1 tablet (5 mg total) by mouth 2 (two) times daily.  (Patient taking differently: Take 5 mg by mouth daily. )  . [DISCONTINUED] amLODipine (NORVASC) 5 MG tablet Take 5 mg by mouth daily.  . [DISCONTINUED] dimenhyDRINATE (DRAMAMINE) 50 MG tablet Take 75 mg by mouth at bedtime as needed. For sleep   . [DISCONTINUED] metoprolol tartrate (LOPRESSOR) 25 MG tablet Take 1 tablet (25 mg total) by mouth 2 (two) times daily.  . [DISCONTINUED] pravastatin (PRAVACHOL) 20 MG tablet TAKE 1 TABLET BY MOUTH DAILY  . [DISCONTINUED] rivaroxaban (XARELTO) 20 MG TABS tablet Take 1 tablet (20 mg total) by mouth daily with supper.  . [DISCONTINUED] spironolactone (ALDACTONE) 25 MG tablet Take 1 tablet (25 mg total) by mouth daily.  . [DISCONTINUED] tamsulosin (FLOMAX) 0.4 MG CAPS capsule TAKE ONE CAPSULE BY MOUTH EVERY DAY     Allergies: No Known Allergies  Social History: The patient  reports that he quit smoking about 29 years ago. He has never used smokeless tobacco. He reports that he does not drink alcohol or use drugs.   Family History: The patient's family history includes Alzheimer's disease in his mother; Cancer in his father; Heart attack in his father; Hypertension in his father and sister; Pancreatic cancer in his father.   Review of Systems: Please see the history of present illness.   Otherwise, the review of systems is positive for none.   All  other systems are reviewed and negative.   Physical Exam: VS:  BP 118/76 (BP Location: Left Arm, Patient Position: Sitting, Cuff Size: Normal)   Pulse (!) 57   Ht 6\' 2"  (1.88 m)   Wt 199 lb 12.8 oz (90.6 kg)   SpO2 98% Comment: at rest  BMI 25.65 kg/m  .  BMI Body mass index is 25.65 kg/m.  Wt Readings from Last 3 Encounters:  04/04/17 199 lb 12.8 oz (90.6 kg)  09/22/16 197 lb 6.4 oz (89.5 kg)  04/14/16 196 lb (88.9 kg)    General: Pleasant. Well developed, well nourished and in no acute distress.   HEENT: Normal.  Neck: Supple, no JVD, carotid bruits, or masses noted.  Cardiac: Regular rate  and rhythm. No murmurs, rubs, or gallops. No edema.  Respiratory:  Lungs are clear to auscultation bilaterally with normal work of breathing.  GI: Soft and nontender.  MS: No deformity or atrophy. Gait and ROM intact.  Skin: Warm and dry. Color is normal.  Neuro:  Strength and sensation are intact and no gross focal deficits noted.  Psych: Alert, appropriate and with normal affect.   LABORATORY DATA:  EKG:  EKG is not ordered today.  Lab Results  Component Value Date   WBC 4.9 09/22/2016   HGB 16.4 09/22/2016   HCT 46.3 09/22/2016   PLT 170 09/22/2016   GLUCOSE 93 09/22/2016   CHOL 106 09/22/2016   TRIG 116 09/22/2016   HDL 26 (L) 09/22/2016   LDLDIRECT 65.4 02/23/2012   LDLCALC 57 09/22/2016   ALT 18 09/22/2016   AST 29 09/22/2016   NA 140 09/22/2016   K 4.5 09/22/2016   CL 99 09/22/2016   CREATININE 1.38 (H) 09/22/2016   BUN 23 09/22/2016   CO2 25 09/22/2016   TSH 2.43 06/03/2010   PSA 1.77 06/03/2010   INR 1.1 ratio (H) 08/15/2009     BNP (last 3 results) No results for input(s): BNP in the last 8760 hours.  ProBNP (last 3 results) No results for input(s): PROBNP in the last 8760 hours.   Other Studies Reviewed Today:   Assessment/Plan: 1. HTN - BP is great on his current regimen. No changes made today.   2. Factor V Leiden deficiency on long-term anticoagulation with Xarelto. No problems noted. Lab today.   3. 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 Polycythemia -  rechecking today  5. HLD - on statin therapy - rechecking labs today  6. PVC - resolved - no symptoms noted.   Current medicines are reviewed with the patient today.  The patient does not have concerns regarding medicines other than what has been noted above.  The following changes have been made:  See above.  Labs/ tests ordered today include:    Orders Placed This Encounter  Procedures  . Basic metabolic panel  . CBC  . Lipid panel  .  Hepatic function panel     Disposition:   FU with me in 6 months.   Patient is agreeable to this plan and will call if any problems develop in the interim.   SignedTruitt Merle, NP  04/04/2017 8:24 AM  [facility] [address] [place], [place]  [address] Phone: [phone] Fax: [phone]

## 2017-04-04 NOTE — Patient Instructions (Addendum)
We will be checking the following labs today - BMET, CBC, HPF, Lipids   Medication Instructions:    Continue with your current medicines.     Testing/Procedures To Be Arranged:  N/A  Follow-Up:   See me in 6 months    Other Special Instructions:   N/A    If you need a refill on your cardiac medications before your next appointment, please call your pharmacy.   Call the [facility] office at [phone] if you have any questions, problems or concerns.      

## 2017-04-05 ENCOUNTER — Other Ambulatory Visit: Payer: Self-pay | Admitting: *Deleted

## 2017-04-05 DIAGNOSIS — R748 Abnormal levels of other serum enzymes: Secondary | ICD-10-CM

## 2017-04-19 ENCOUNTER — Ambulatory Visit (INDEPENDENT_AMBULATORY_CARE_PROVIDER_SITE_OTHER): Payer: Medicare Other | Admitting: Internal Medicine

## 2017-04-19 ENCOUNTER — Encounter: Payer: Self-pay | Admitting: Internal Medicine

## 2017-04-19 VITALS — BP 130/80 | HR 58 | Temp 98.1°F | Ht 74.0 in | Wt 204.2 lb

## 2017-04-19 DIAGNOSIS — Z23 Encounter for immunization: Secondary | ICD-10-CM

## 2017-04-19 DIAGNOSIS — E7800 Pure hypercholesterolemia, unspecified: Secondary | ICD-10-CM

## 2017-04-19 DIAGNOSIS — I10 Essential (primary) hypertension: Secondary | ICD-10-CM | POA: Diagnosis not present

## 2017-04-19 DIAGNOSIS — D751 Secondary polycythemia: Secondary | ICD-10-CM

## 2017-04-19 DIAGNOSIS — Z0000 Encounter for general adult medical examination without abnormal findings: Secondary | ICD-10-CM | POA: Diagnosis not present

## 2017-04-19 NOTE — Progress Notes (Signed)
Subjective:    Patient ID: MCGWIRE DASARO, male    DOB: 1946-05-02, 71 y.o.   MRN: [id number]  HPI  71 year old patient who is seen today for a preventive care examination and subsequent Medicare wellness visit He is followed by cardiology with a history of essential hypertension. He has a history of recurrent DVT and hypercoagulability secondary to factor V Leiden deficiency. Doing quite well. Aldactone recently down titrated slightly ue to slight increase in creatinine with normal potassium. His recent had screening lab  Family history.  Father died, place and pancreatic cancer Mother history of Alzheimer's disease Family history positive for hypertension  Last colonoscopy 8 years ago  Past Medical History:  Diagnosis Date  . Chronic anticoagulation   . Dry cough   . DVT (deep venous thrombosis) ([place])   . Factor V deficiency ([place])   . Hypercholesterolemia   . Hypertension   . Polycythemia      Social History   Social History  . Marital status: Married    Spouse name: N/A  . Number of children: N/A  . Years of education: N/A   Occupational History  . Not on file.   Social History Main Topics  . Smoking status: Former Smoker    Quit date: 10/12/1987  . Smokeless tobacco: Never Used  . Alcohol use No     Comment: rarely  . Drug use: No  . Sexual activity: Yes   Other Topics Concern  . Not on file   Social History Narrative  . No narrative on file    Past Surgical History:  Procedure Laterality Date  . TONSILLECTOMY      Family History  Problem Relation Age of Onset  . Pancreatic cancer Father   . Hypertension Father   . Heart attack Father   . Cancer Father   . Alzheimer's disease Mother   . Hypertension Sister     No Known Allergies  Current Outpatient Prescriptions on File Prior to Visit  Medication Sig Dispense Refill  . amLODipine (NORVASC) 5 MG tablet one tablet daily  3  . metoprolol tartrate (LOPRESSOR) 25 MG tablet Take 25 mg by mouth  2 (two) times daily.   3  . pravastatin (PRAVACHOL) 20 MG tablet one tablet daily  3  . spironolactone (ALDACTONE) 25 MG tablet Take 12.5 mg by mouth daily.  3  . tamsulosin (FLOMAX) 0.4 MG CAPS capsule one tablet daily  0  . XARELTO 20 MG TABS tablet Take 20 mg by mouth daily with supper.   1   No current facility-administered medications on file prior to visit.     BP 130/80 (BP Location: Left Arm, Patient Position: Sitting, Cuff Size: Normal)   Pulse (!) 58   Temp 98.1 F (36.7 C) (Oral)   Ht 6\' 2"  (1.88 m)   Wt 204 lb 3.2 oz (92.6 kg)   SpO2 98%   BMI 26.22 kg/m   Subsequent Medicare wellness visit  1. Risk factors, based on past  M,S,F history.  Risk factors include hypertension, and dyslipidemia.  He has remote tobacco history, but discontinued at age 61.  He has a history of polycythemia  2.  Physical activities: No exercise limitation.  Walks his dog daily.  Very active with yard work  3.  Depression/mood: No history depression or mood disorder  4.  Hearing: has chronic tinnitus since age 60; wife complains a bit of some mild hearing impairment  5.  ADL's: Independent in all aspects of  daily living  6.  Fall risk: Low  7.  Home safety: No problems identified  8.  Height weight, and visual acuity; height and weight stable.  Cataract extraction surgery in January 2016.  Does obtain an annual eye, examination.    9.  Counseling: Heart healthy diet regular exercise encouraged  10. Lab orders based on risk factors: Laboratory profile including lipid panel reviewed  11. Referral : Not appropriate at this time.  Follow-up cardiology as scheduled  12. Care plan: Continue heart healthy diet regular exercise.  Patient appears to be near ideal body weight  13. Cognitive assessment: Alert and oriented with normal affect.  No cognitive dysfunction  14. Screening: Eye examination in November.  Repeat colonoscopy 2020  15. Provider List Update: Cardiology  and GI as well as ophthalmology Review of Systems  Constitutional: Negative for appetite change, chills, fatigue and fever.  HENT: Positive for tinnitus. Negative for congestion, dental problem, ear pain, hearing loss, sore throat, trouble swallowing and voice change.   Eyes: Negative for pain, discharge and visual disturbance.  Respiratory: Negative for cough, chest tightness, wheezing and stridor.   Cardiovascular: Negative for chest pain, palpitations and leg swelling.  Gastrointestinal: Negative for abdominal distention, abdominal pain, blood in stool, constipation, diarrhea, nausea and vomiting.  Genitourinary: Negative for difficulty urinating, discharge, flank pain, genital sores, hematuria and urgency.  Musculoskeletal: Negative for arthralgias, back pain, gait problem, joint swelling, myalgias and neck stiffness.  Skin: Negative for rash.  Neurological: Negative for dizziness, syncope, speech difficulty, weakness, numbness and headaches.  Hematological: Negative for adenopathy. Does not bruise/bleed easily.  Psychiatric/Behavioral: Negative for behavioral problems and dysphoric mood. The patient is not nervous/anxious.        Objective:   Physical Exam  Constitutional: He appears well-developed and well-nourished.  HENT:  Head: Normocephalic and atraumatic.  Right Ear: External ear normal.  Left Ear: External ear normal.  Nose: Nose normal.  Mouth/Throat: Oropharynx is clear and moist.  Eyes: Pupils are equal, round, and reactive to light. Conjunctivae and EOM are normal. No scleral icterus.  Neck: Normal range of motion. Neck supple. No JVD present. No thyromegaly present.  Cardiovascular: Regular rhythm, normal heart sounds and intact distal pulses.  Exam reveals no gallop and no friction rub.   No murmur heard. Decreased left posterior tibial pulse  Pulmonary/Chest: Effort normal and breath sounds normal. He exhibits no tenderness.  Abdominal: Soft. Bowel sounds are  normal. He exhibits no distension and no mass. There is no tenderness.  Genitourinary: Prostate normal and penis normal. Rectal exam shows guaiac negative stool.  Musculoskeletal: Normal range of motion. He exhibits no edema or tenderness.  Lymphadenopathy:    He has no cervical adenopathy.  Neurological: He is alert. He has normal reflexes. No cranial nerve deficit. Coordination normal.  Skin: Skin is warm and dry. No rash noted.  Psychiatric: He has a normal mood and affect. His behavior is normal.          Assessment & Plan:   Preventive health examination Social Medicare wellness visit History chronic DVT.  Continue chronic anticoagulation Factor V Leiden deficiency Mild BPH  History polycythemia  Follow-up one year or as needed  Nyoka Cowden

## 2017-04-19 NOTE — Patient Instructions (Signed)
Limit your sodium (Salt) intake    It is important that you exercise regularly, at least 20 minutes 3 to 4 times per week.  If you develop chest pain or shortness of breath seek  medical attention.  Return in one year for follow-up  Cardiology follow-up as scheduled

## 2017-05-06 ENCOUNTER — Other Ambulatory Visit: Payer: Medicare Other | Admitting: *Deleted

## 2017-05-06 DIAGNOSIS — R748 Abnormal levels of other serum enzymes: Secondary | ICD-10-CM

## 2017-05-06 LAB — BASIC METABOLIC PANEL
BUN/Creatinine Ratio: 11 (ref 10–24)
BUN: 15 mg/dL (ref 8–27)
CO2: 27 mmol/L (ref 20–29)
Calcium: 9.5 mg/dL (ref 8.6–10.2)
Chloride: 99 mmol/L (ref 96–106)
Creatinine, Ser: 1.33 mg/dL — ABNORMAL HIGH (ref 0.76–1.27)
GFR calc Af Amer: 63 mL/min/{1.73_m2} (ref >59)
GFR calc non Af Amer: 55 mL/min/{1.73_m2} — ABNORMAL LOW (ref >59)
Glucose: 91 mg/dL (ref 65–99)
Potassium: 4.1 mmol/L (ref 3.5–5.2)
Sodium: 141 mmol/L (ref 134–144)

## 2017-05-08 ENCOUNTER — Other Ambulatory Visit: Payer: Self-pay | Admitting: Nurse Practitioner

## 2017-05-09 ENCOUNTER — Telehealth: Payer: Self-pay | Admitting: Nurse Practitioner

## 2017-05-09 NOTE — Telephone Encounter (Signed)
-----   Message from Michae Kava, [place] sent at 05/09/2017  3:00 PM EST -----   ----- Message ----- From: Michae Kava, CMA Sent: 05/09/2017   9:53 AM To: Marletta Lor, MD  Left message to go over lab results.

## 2017-05-09 NOTE — Telephone Encounter (Signed)
Mr.Werth is returning a call about his lab results . Thanks

## 2017-05-09 NOTE — Telephone Encounter (Signed)
Pt has been notified of lab results by phone with verbal understanding. Pt aware to continue on current Tx plan. Pt is agreeable to plan of care. Pt thanked me for my call today.

## 2017-06-16 ENCOUNTER — Other Ambulatory Visit: Payer: Self-pay | Admitting: Nurse Practitioner

## 2017-06-16 ENCOUNTER — Other Ambulatory Visit: Payer: Self-pay | Admitting: Internal Medicine

## 2017-06-16 NOTE — Telephone Encounter (Signed)
Xarelto 20mg  refill request received; pt is 71 yrs old, wt- 90.6kg, Crea-1.33 on 05/06/17, last seen by Truitt Merle on 04/04/17, CrCl-57.6ml/min. Will send in refill to requested pharmacy.

## 2017-06-17 NOTE — Telephone Encounter (Signed)
Patient would like a refill for medication. Medication was discontinued 03/2017. Please advise if okay to refill. Thank you.

## 2017-06-20 NOTE — Telephone Encounter (Signed)
Okay for refill?  

## 2017-07-05 NOTE — Telephone Encounter (Signed)
Error

## 2017-09-12 ENCOUNTER — Other Ambulatory Visit: Payer: Self-pay | Admitting: Nurse Practitioner

## 2017-09-15 ENCOUNTER — Other Ambulatory Visit: Payer: Self-pay | Admitting: Internal Medicine

## 2017-10-10 ENCOUNTER — Encounter: Payer: Self-pay | Admitting: Nurse Practitioner

## 2017-10-10 ENCOUNTER — Ambulatory Visit (INDEPENDENT_AMBULATORY_CARE_PROVIDER_SITE_OTHER): Payer: Medicare Other | Admitting: Nurse Practitioner

## 2017-10-10 VITALS — BP 140/84 | HR 57 | Ht 74.0 in | Wt 206.4 lb

## 2017-10-10 DIAGNOSIS — Z79899 Other long term (current) drug therapy: Secondary | ICD-10-CM | POA: Diagnosis not present

## 2017-10-10 DIAGNOSIS — E7800 Pure hypercholesterolemia, unspecified: Secondary | ICD-10-CM | POA: Diagnosis not present

## 2017-10-10 DIAGNOSIS — Z7901 Long term (current) use of anticoagulants: Secondary | ICD-10-CM | POA: Diagnosis not present

## 2017-10-10 DIAGNOSIS — I119 Hypertensive heart disease without heart failure: Secondary | ICD-10-CM | POA: Diagnosis not present

## 2017-10-10 LAB — CBC
Hematocrit: 42.7 % (ref 37.5–51.0)
Hemoglobin: 15.5 g/dL (ref 13.0–17.7)
MCH: 35.9 pg — ABNORMAL HIGH (ref 26.6–33.0)
MCHC: 36.3 g/dL — ABNORMAL HIGH (ref 31.5–35.7)
MCV: 99 fL — ABNORMAL HIGH (ref 79–97)
Platelets: 157 10*3/uL (ref 150–379)
RBC: 4.32 x10E6/uL (ref 4.14–5.80)
RDW: 12.1 % — ABNORMAL LOW (ref 12.3–15.4)
WBC: 5.9 10*3/uL (ref 3.4–10.8)

## 2017-10-10 LAB — BASIC METABOLIC PANEL
BUN/Creatinine Ratio: 13 (ref 10–24)
BUN: 19 mg/dL (ref 8–27)
CO2: 24 mmol/L (ref 20–29)
Calcium: 9.1 mg/dL (ref 8.6–10.2)
Chloride: 101 mmol/L (ref 96–106)
Creatinine, Ser: 1.42 mg/dL — ABNORMAL HIGH (ref 0.76–1.27)
GFR calc Af Amer: 58 mL/min/{1.73_m2} — ABNORMAL LOW (ref >59)
GFR calc non Af Amer: 50 mL/min/{1.73_m2} — ABNORMAL LOW (ref >59)
Glucose: 99 mg/dL (ref 65–99)
Potassium: 4.1 mmol/L (ref 3.5–5.2)
Sodium: 136 mmol/L (ref 134–144)

## 2017-10-10 LAB — HEPATIC FUNCTION PANEL
ALT: 24 IU/L (ref 0–44)
AST: 29 IU/L (ref 0–40)
Albumin: 4.2 g/dL (ref 3.6–4.8)
Alkaline Phosphatase: 56 IU/L (ref 39–117)
Bilirubin Total: 0.8 mg/dL (ref 0.0–1.2)
Bilirubin, Direct: 0.19 mg/dL (ref 0.00–0.40)
Total Protein: 7.1 g/dL (ref 6.0–8.5)

## 2017-10-10 LAB — LIPID PANEL
Chol/HDL Ratio: 4 ratio (ref 0.0–5.0)
Cholesterol, Total: 117 mg/dL (ref 100–199)
HDL: 29 mg/dL — ABNORMAL LOW (ref >39)
LDL Calculated: 64 mg/dL (ref 0–99)
Triglycerides: 118 mg/dL (ref 0–149)
VLDL Cholesterol Cal: 24 mg/dL (ref 5–40)

## 2017-10-10 NOTE — Patient Instructions (Addendum)
We will be checking the following labs today - BMET, CBC, HPF and lipids   Medication Instructions:    Continue with your current medicines.     Testing/Procedures To Be Arranged:  N/A  Follow-Up:   See me in 6 months with fasting labs   Other Special Instructions:   Think about what we talked about today.     If you need a refill on your cardiac medications before your next appointment, please call your pharmacy.   Call the [place] office at [phone] if you have any questions, problems or concerns.

## 2017-10-10 NOTE — Progress Notes (Signed)
CARDIOLOGY OFFICE NOTE  Date:  10/10/2017    [name] Date of Birth: March 04, 1946 Medical Record #[id number]  PCP:  Marletta Lor, MD  Cardiologist:  Servando Snare    Chief Complaint  Patient presents with  . Hypertension  . Hyperlipidemia    6 month check    History of Present Illness: Patrick Sosa is a 72 y.o. male who presents today for a 6 month check. Former patient of Dr. Sherryl Barters. He primarily follows with me.   He has a history of essential hypertension, DVT, HLD, polycythemia and is on chronic Xarelto. He is no longer able to donate blood to the [employer] because he is on Xarelto. 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   Last seen back in October and he was doing well. BP controlled.   Comes back today. Here alone. He says he is doing well. BP better at home. No chest pain. Not short of breath. Not dizzy. No excessive bruising. No bleeding. He has been babysitting his 24 year old granddaughter every day - seems a little exhausting for him. Weight is up - he admits his diet could be better. Overall, he feels well and has no complaints.    Past Medical History:  Diagnosis Date  . Chronic anticoagulation   . Dry cough   . DVT (deep venous thrombosis) ([place])   . Factor V deficiency ([place])   . Hypercholesterolemia   . Hypertension   . Polycythemia     Past Surgical History:  Procedure Laterality Date  . TONSILLECTOMY       Medications: Current Meds  Medication Sig  . amLODipine (NORVASC) 5 MG tablet Take 1 tablet (5 mg total) by mouth daily.  . metoprolol tartrate (LOPRESSOR) 25 MG tablet TAKE 1 TABLET(25 MG) BY MOUTH TWICE DAILY  . pravastatin (PRAVACHOL) 20 MG tablet one tablet daily  . spironolactone (ALDACTONE) 25 MG tablet Take 0.5 tablets (12.5 mg total) by mouth daily.  . tamsulosin (FLOMAX) 0.4 MG CAPS capsule TAKE ONE CAPSULE BY MOUTH EVERY DAY  . XARELTO 20 MG TABS tablet TAKE 1 TABLET(20 MG) BY MOUTH DAILY  WITH SUPPER     Allergies: No Known Allergies  Social History: The patient  reports that he quit smoking about 30 years ago. He has never used smokeless tobacco. He reports that he does not drink alcohol or use drugs.   Family History: The patient's family history includes Alzheimer's disease in his mother; Cancer in his father; Heart attack in his father; Hypertension in his father and sister; Pancreatic cancer in his father.   Review of Systems: Please see the history of present illness.   Otherwise, the review of systems is positive for none.   All other systems are reviewed and negative.   Physical Exam: VS:  BP 140/84 (BP Location: Left Arm, Patient Position: Sitting, Cuff Size: Normal)   Pulse (!) 57   Ht 6\' 2"  (1.88 m)   Wt 206 lb 6.4 oz (93.6 kg)   BMI 26.50 kg/m  .  BMI Body mass index is 26.5 kg/m.  Wt Readings from Last 3 Encounters:  10/10/17 206 lb 6.4 oz (93.6 kg)  04/19/17 204 lb 3.2 oz (92.6 kg)  04/04/17 199 lb 12.8 oz (90.6 kg)    General: Pleasant. Well developed, well nourished and in no acute distress. He has gained some weight since last visit with me.   HEENT: Normal.  Neck: Supple, no JVD, carotid  bruits, or masses noted.  Cardiac: Regular rate and rhythm. No murmurs, rubs, or gallops. No edema.  Respiratory:  Lungs are clear to auscultation bilaterally with normal work of breathing.  GI: Soft and nontender.  MS: No deformity or atrophy. Gait and ROM intact.  Skin: Warm and dry. Color is normal.  Neuro:  Strength and sensation are intact and no gross focal deficits noted.  Psych: Alert, appropriate and with normal affect.   LABORATORY DATA:  EKG:  EKG is ordered today. This demonstrates Sinus brady - HR 57.   Lab Results  Component Value Date   WBC 4.8 04/04/2017   HGB 16.1 04/04/2017   HCT 44.3 04/04/2017   PLT 164 04/04/2017   GLUCOSE 91 05/06/2017   CHOL 114 04/04/2017   TRIG 143 04/04/2017   HDL 27 (L) 04/04/2017   LDLDIRECT 65.4  02/23/2012   LDLCALC 58 04/04/2017   ALT 24 04/04/2017   AST 35 04/04/2017   NA 141 05/06/2017   K 4.1 05/06/2017   CL 99 05/06/2017   CREATININE 1.33 (H) 05/06/2017   BUN 15 05/06/2017   CO2 27 05/06/2017   TSH 2.43 06/03/2010   PSA 1.77 06/03/2010   INR 1.1 ratio (H) 08/15/2009     BNP (last 3 results) No results for input(s): BNP in the last 8760 hours.  ProBNP (last 3 results) No results for input(s): PROBNP in the last 8760 hours.   Other Studies Reviewed Today:   Assessment/Plan:  1. HTN - BP ok here - better at home - he will continue to monitor. Encouraged him to get back to better CV risk factor modification.   2. Factor V Leiden deficiency on long-term anticoagulation with Xarelto. No problems noted. He needs lab today.   3. 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   4. Polycythemia - CBC toay  5. HLD - on statin therapy - rechecking labs today, Tolerated well.   Current medicines are reviewed with the patient today.  The patient does not have concerns regarding medicines other than what has been noted above.  The following changes have been made:  See above.  Labs/ tests ordered today include:    Orders Placed This Encounter  Procedures  . Basic metabolic panel  . CBC  . Hepatic function panel  . Lipid panel  . EKG 12-Lead     Disposition:   FU with me in 6 months.   Patient is agreeable to this plan and will call if any problems develop in the interim.   SignedTruitt Merle, NP  10/10/2017 8:30 AM  [facility] [address] [place], [place]  [address] Phone: [phone] Fax: [phone]

## 2017-12-02 ENCOUNTER — Other Ambulatory Visit: Payer: Self-pay | Admitting: Nurse Practitioner

## 2017-12-12 ENCOUNTER — Other Ambulatory Visit: Payer: Self-pay | Admitting: Internal Medicine

## 2017-12-14 NOTE — Telephone Encounter (Signed)
Sent to the pharmacy by e-scribe. 

## 2018-03-09 ENCOUNTER — Other Ambulatory Visit: Payer: Self-pay | Admitting: Nurse Practitioner

## 2018-03-13 ENCOUNTER — Other Ambulatory Visit: Payer: Self-pay | Admitting: Internal Medicine

## 2018-04-03 ENCOUNTER — Ambulatory Visit: Payer: Medicare Other | Admitting: Nurse Practitioner

## 2018-04-06 ENCOUNTER — Encounter: Payer: Self-pay | Admitting: Nurse Practitioner

## 2018-04-07 ENCOUNTER — Ambulatory Visit (INDEPENDENT_AMBULATORY_CARE_PROVIDER_SITE_OTHER): Payer: Medicare Other | Admitting: Nurse Practitioner

## 2018-04-07 ENCOUNTER — Encounter: Payer: Self-pay | Admitting: Nurse Practitioner

## 2018-04-07 VITALS — BP 120/80 | HR 52 | Ht 74.0 in | Wt 204.0 lb

## 2018-04-07 DIAGNOSIS — Z79899 Other long term (current) drug therapy: Secondary | ICD-10-CM

## 2018-04-07 DIAGNOSIS — E7800 Pure hypercholesterolemia, unspecified: Secondary | ICD-10-CM | POA: Diagnosis not present

## 2018-04-07 DIAGNOSIS — I119 Hypertensive heart disease without heart failure: Secondary | ICD-10-CM

## 2018-04-07 DIAGNOSIS — Z7901 Long term (current) use of anticoagulants: Secondary | ICD-10-CM

## 2018-04-07 LAB — LIPID PANEL
Chol/HDL Ratio: 4.3 ratio (ref 0.0–5.0)
Cholesterol, Total: 119 mg/dL (ref 100–199)
HDL: 28 mg/dL — ABNORMAL LOW (ref >39)
LDL Calculated: 59 mg/dL (ref 0–99)
Triglycerides: 158 mg/dL — ABNORMAL HIGH (ref 0–149)
VLDL Cholesterol Cal: 32 mg/dL (ref 5–40)

## 2018-04-07 LAB — HEPATIC FUNCTION PANEL
ALT: 31 IU/L (ref 0–44)
AST: 36 IU/L (ref 0–40)
Albumin: 4.6 g/dL (ref 3.6–4.8)
Alkaline Phosphatase: 62 IU/L (ref 39–117)
Bilirubin Total: 1 mg/dL (ref 0.0–1.2)
Bilirubin, Direct: 0.25 mg/dL (ref 0.00–0.40)
Total Protein: 7.6 g/dL (ref 6.0–8.5)

## 2018-04-07 LAB — CBC
Hematocrit: 46.4 % (ref 37.5–51.0)
Hemoglobin: 16.8 g/dL (ref 13.0–17.7)
MCH: 35.9 pg — ABNORMAL HIGH (ref 26.6–33.0)
MCHC: 36.2 g/dL — ABNORMAL HIGH (ref 31.5–35.7)
MCV: 99 fL — ABNORMAL HIGH (ref 79–97)
Platelets: 170 10*3/uL (ref 150–450)
RBC: 4.68 x10E6/uL (ref 4.14–5.80)
RDW: 12.9 % (ref 12.3–15.4)
WBC: 5.9 10*3/uL (ref 3.4–10.8)

## 2018-04-07 LAB — BASIC METABOLIC PANEL
BUN/Creatinine Ratio: 14 (ref 10–24)
BUN: 19 mg/dL (ref 8–27)
CO2: 25 mmol/L (ref 20–29)
Calcium: 9.4 mg/dL (ref 8.6–10.2)
Chloride: 99 mmol/L (ref 96–106)
Creatinine, Ser: 1.4 mg/dL — ABNORMAL HIGH (ref 0.76–1.27)
GFR calc Af Amer: 59 mL/min/{1.73_m2} — ABNORMAL LOW (ref >59)
GFR calc non Af Amer: 51 mL/min/{1.73_m2} — ABNORMAL LOW (ref >59)
Glucose: 96 mg/dL (ref 65–99)
Potassium: 4.3 mmol/L (ref 3.5–5.2)
Sodium: 139 mmol/L (ref 134–144)

## 2018-04-07 LAB — TSH: TSH: 3.67 u[IU]/mL (ref 0.450–4.500)

## 2018-04-07 NOTE — Patient Instructions (Addendum)
We will be checking the following labs today - BMET, CBC, HPF, TSH and lipids  If you have labs (blood work) drawn today and your tests are completely normal, you will receive your results only by: Marland Kitchen MyChart Message (if you have MyChart) OR . A paper copy in the mail If you have any lab test that is abnormal or we need to change your treatment, we will call you to review the results.   Medication Instructions:    Continue with your current medicines.    If you need a refill on your cardiac medications before your next appointment, please call your pharmacy.     Testing/Procedures To Be Arranged:  N/A  Follow-Up:   See me in 6 months with fasting labs    At [facility], you and your health needs are our priority.  As part of our continuing mission to provide you with exceptional heart care, we have created designated Provider Care Teams.  These Care Teams include your primary Cardiologist (physician) and Advanced Practice Providers (APPs -  Physician Assistants and Nurse Practitioners) who all work together to provide you with the care you need, when you need it.  Special Instructions:  . None  Call the [place] office at [phone] if you have any questions, problems or concerns.

## 2018-04-07 NOTE — Progress Notes (Signed)
CARDIOLOGY OFFICE NOTE  Date:  04/07/2018    Patrick Sosa Date of Birth: 1945-10-07 Medical Record #[id number]  PCP:  Marletta Lor, MD  Cardiologist:  Servando Snare     Chief Complaint  Patient presents with  . Hypertension    Follow up visit     History of Present Illness: Patrick Sosa is a 72 y.o. male who presents today for a follow up visit. Former patient of Dr. Sherryl Barters. Heprimarilyfollows with me.   He has a history of essential hypertension, DVT, HLD, polycythemia and is on chronic Xarelto. He is no longer able to donate blood to the [facility] because he is on Xarelto. 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   Last seen back in April and he was doing well. BP typically better at home. Was spending more time babysitting grand kids.   Comes back today. Here alone. His PCP has retired - he was told there was no doctor for him. He is a little upset about this. He feels well. No chest pain. Breathing is good. Not dizzy or lightheaded. No bleeding or excessive bruising. He is exercising - not as much - but feels well with this. Overall, has no real concerns.   Past Medical History:  Diagnosis Date  . Chronic anticoagulation   . Dry cough   . DVT (deep venous thrombosis) ([place])   . Factor V deficiency ([place])   . Hypercholesterolemia   . Hypertension   . Polycythemia     Past Surgical History:  Procedure Laterality Date  . TONSILLECTOMY       Medications: Current Meds  Medication Sig  . amLODipine (NORVASC) 5 MG tablet TAKE 1 TABLET(5 MG) BY MOUTH DAILY  . metoprolol tartrate (LOPRESSOR) 25 MG tablet TAKE 1 TABLET(25 MG) BY MOUTH TWICE DAILY  . pravastatin (PRAVACHOL) 20 MG tablet TAKE 1 TABLET BY MOUTH DAILY  . spironolactone (ALDACTONE) 25 MG tablet TAKE 1/2 TABLET(12.5 MG) BY MOUTH DAILY  . tamsulosin (FLOMAX) 0.4 MG CAPS capsule TAKE ONE CAPSULE BY MOUTH EVERY DAY  . XARELTO 20 MG TABS tablet TAKE 1 TABLET(20 MG) BY  MOUTH DAILY WITH SUPPER     Allergies: No Known Allergies  Social History: The patient  reports that he quit smoking about 30 years ago. He has never used smokeless tobacco. He reports that he does not drink alcohol or use drugs.   Family History: The patient's family history includes Alzheimer's disease in his mother; Cancer in his father; Heart attack in his father; Hypertension in his father and sister; Pancreatic cancer in his father.   Review of Systems: Please see the history of present illness.   Otherwise, the review of systems is positive for none.   All other systems are reviewed and negative.   Physical Exam: VS:  BP 120/80 (BP Location: Left Arm, Patient Position: Sitting, Cuff Size: Normal)   Pulse (!) 52   Ht 6\' 2"  (1.88 m)   Wt 204 lb (92.5 kg)   SpO2 97% Comment: at rest  BMI 26.19 kg/m  .  BMI Body mass index is 26.19 kg/m.  Wt Readings from Last 3 Encounters:  04/07/18 204 lb (92.5 kg)  10/10/17 206 lb 6.4 oz (93.6 kg)  04/19/17 204 lb 3.2 oz (92.6 kg)    General: Pleasant. Well developed, well nourished and in no acute distress.   HEENT: Normal.  Neck: Supple, no JVD, carotid bruits, or masses noted.  Cardiac: Regular  rate and rhythm. No murmurs, rubs, or gallops. No edema.  Respiratory:  Lungs are clear to auscultation bilaterally with normal work of breathing.  GI: Soft and nontender.  MS: No deformity or atrophy. Gait and ROM intact.  Skin: Warm and dry. Color is normal.  Neuro:  Strength and sensation are intact and no gross focal deficits noted.  Psych: Alert, appropriate and with normal affect.   LABORATORY DATA:  EKG:  EKG is not ordered today.  Lab Results  Component Value Date   WBC 5.9 10/10/2017   HGB 15.5 10/10/2017   HCT 42.7 10/10/2017   PLT 157 10/10/2017   GLUCOSE 99 10/10/2017   CHOL 117 10/10/2017   TRIG 118 10/10/2017   HDL 29 (L) 10/10/2017   LDLDIRECT 65.4 02/23/2012   LDLCALC 64 10/10/2017   ALT 24 10/10/2017   AST  29 10/10/2017   NA 136 10/10/2017   K 4.1 10/10/2017   CL 101 10/10/2017   CREATININE 1.42 (H) 10/10/2017   BUN 19 10/10/2017   CO2 24 10/10/2017   TSH 2.43 06/03/2010   PSA 1.77 06/03/2010   INR 1.1 ratio (H) 08/15/2009     BNP (last 3 results) No results for input(s): BNP in the last 8760 hours.  ProBNP (last 3 results) No results for input(s): PROBNP in the last 8760 hours.   Other Studies Reviewed Today:   Assessment/Plan:  1. HTN -BP is great - no changes with his current regimen.   2. Factor V Leiden deficiency on long-term anticoagulation with Xarelto.No bleeding/bruising. He needs labs today.    3. 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 No problems noted.   4. Polycythemia - lab today.   5. HLD - remains on statin - checking labs today.   Current medicines are reviewed with the patient today.  The patient does not have concerns regarding medicines other than what has been noted above.  The following changes have been made:  See above.  Labs/ tests ordered today include:    Orders Placed This Encounter  Procedures  . Basic metabolic panel  . CBC  . Hepatic function panel  . Lipid panel  . TSH     Disposition:   FU with me in 6 months with fasting labs.   Patient is agreeable to this plan and will call if any problems develop in the interim.   SignedTruitt Merle, NP  04/07/2018 8:35 AM  [facility] [address] [place], [place]  [address] Phone: [phone] Fax: [phone]

## 2018-04-18 ENCOUNTER — Encounter: Payer: Self-pay | Admitting: Adult Health

## 2018-04-18 ENCOUNTER — Other Ambulatory Visit: Payer: Self-pay | Admitting: Adult Health

## 2018-04-18 ENCOUNTER — Other Ambulatory Visit: Payer: Self-pay | Admitting: Nurse Practitioner

## 2018-04-18 ENCOUNTER — Ambulatory Visit (INDEPENDENT_AMBULATORY_CARE_PROVIDER_SITE_OTHER): Payer: Medicare Other | Admitting: Adult Health

## 2018-04-18 VITALS — BP 130/80 | Temp 98.3°F | Wt 206.0 lb

## 2018-04-18 DIAGNOSIS — Z23 Encounter for immunization: Secondary | ICD-10-CM

## 2018-04-18 DIAGNOSIS — Z125 Encounter for screening for malignant neoplasm of prostate: Secondary | ICD-10-CM | POA: Diagnosis not present

## 2018-04-18 DIAGNOSIS — I119 Hypertensive heart disease without heart failure: Secondary | ICD-10-CM

## 2018-04-18 DIAGNOSIS — Z7901 Long term (current) use of anticoagulants: Secondary | ICD-10-CM | POA: Diagnosis not present

## 2018-04-18 DIAGNOSIS — Z0000 Encounter for general adult medical examination without abnormal findings: Secondary | ICD-10-CM

## 2018-04-18 DIAGNOSIS — Z79899 Other long term (current) drug therapy: Secondary | ICD-10-CM

## 2018-04-18 DIAGNOSIS — R972 Elevated prostate specific antigen [PSA]: Secondary | ICD-10-CM

## 2018-04-18 DIAGNOSIS — E7800 Pure hypercholesterolemia, unspecified: Secondary | ICD-10-CM | POA: Diagnosis not present

## 2018-04-18 LAB — BASIC METABOLIC PANEL
BUN: 17 mg/dL (ref 6–23)
CHLORIDE: 101 meq/L (ref 96–112)
CO2: 29 mEq/L (ref 19–32)
Calcium: 9.5 mg/dL (ref 8.4–10.5)
Creatinine, Ser: 1.18 mg/dL (ref 0.40–1.50)
GFR: 64.88 mL/min (ref >60.00)
Glucose, Bld: 90 mg/dL (ref 70–99)
POTASSIUM: 4 meq/L (ref 3.5–5.1)
SODIUM: 137 meq/L (ref 135–145)

## 2018-04-18 LAB — PSA: PSA: 4.47 ng/mL — ABNORMAL HIGH (ref 0.10–4.00)

## 2018-04-18 NOTE — Progress Notes (Signed)
Patient presents to clinic today to establish care. He is a pleasant 72 year old male who  has a past medical history of Chronic anticoagulation, DVT (deep venous thrombosis) ([place]), Factor V deficiency ([place]), Hypercholesterolemia, Hypertension, and Polycythemia.  He is a former patient of Dr. Raliegh Ip who I am seeing today for transfer of care   Acute Concerns: Establish Care/CPE  Chronic Issues:  Hypertension - Managed by Cardiology. Prescribed Norvasc, Metoprolol, and Spirolactone  BP Readings from Last 3 Encounters:  04/18/18 130/80  04/07/18 120/80  10/10/17 140/84   Hyperlipidemia - Controlled with Statin - managed by cardiology  Lab Results  Component Value Date   CHOL 119 04/07/2018   HDL 28 (L) 04/07/2018   LDLCALC 59 04/07/2018   LDLDIRECT 65.4 02/23/2012   TRIG 158 (H) 04/07/2018   CHOLHDL 4.3 04/07/2018   Factor V deficiency - has had multiple DVT's in the past. Currently on lifelong anticoagulation with Xeralto. Managed by Cardiology   BPH - Controlled with Flomax. Denies any issues.   Health Maintenance: Dental --UTD Vision -- UTD Immunizations -- UTD Colonoscopy -- Due in 2020   Past Medical History:  Diagnosis Date  . Chronic anticoagulation   . DVT (deep venous thrombosis) ([place])   . Factor V deficiency ([place])   . Hypercholesterolemia   . Hypertension   . Polycythemia     Past Surgical History:  Procedure Laterality Date  . CATARACT EXTRACTION, BILATERAL Bilateral 2018  . TONSILLECTOMY      Current Outpatient Medications on File Prior to Visit  Medication Sig Dispense Refill  . amLODipine (NORVASC) 5 MG tablet TAKE 1 TABLET(5 MG) BY MOUTH DAILY 90 tablet 2  . dimenhyDRINATE (DRAMAMINE) 50 MG tablet Take 25 mg by mouth at bedtime as needed. As needed for sleep.    . pravastatin (PRAVACHOL) 20 MG tablet TAKE 1 TABLET BY MOUTH DAILY 90 tablet 3  . spironolactone (ALDACTONE) 25 MG tablet TAKE 1/2 TABLET(12.5 MG) BY MOUTH DAILY 45 tablet 2  .  tamsulosin (FLOMAX) 0.4 MG CAPS capsule TAKE ONE CAPSULE BY MOUTH EVERY DAY 90 capsule 0  . XARELTO 20 MG TABS tablet TAKE 1 TABLET(20 MG) BY MOUTH DAILY WITH SUPPER 90 tablet 1   No current facility-administered medications on file prior to visit.     No Known Allergies  Family History  Problem Relation Age of Onset  . Pancreatic cancer Father   . Hypertension Father   . Heart attack Father   . Alzheimer's disease Mother   . Hypertension Sister   . Breast cancer Sister     Social History   Socioeconomic History  . Marital status: Married    Spouse name: Not on file  . Number of children: Not on file  . Years of education: Not on file  . Highest education level: Not on file  Occupational History  . Not on file  Social Needs  . Financial resource strain: Not on file  . Food insecurity:    Worry: Not on file    Inability: Not on file  . Transportation needs:    Medical: Not on file    Non-medical: Not on file  Tobacco Use  . Smoking status: Former Smoker    Last attempt to quit: 10/12/1987    Years since quitting: 30.5  . Smokeless tobacco: Never Used  Substance and Sexual Activity  . Alcohol use: No    Alcohol/week: 0.0 standard drinks    Comment: rarely  . Drug use:  No  . Sexual activity: Yes  Lifestyle  . Physical activity:    Days per week: Not on file    Minutes per session: Not on file  . Stress: Not on file  Relationships  . Social connections:    Talks on phone: Not on file    Gets together: Not on file    Attends religious service: Not on file    Active member of club or organization: Not on file    Attends meetings of clubs or organizations: Not on file    Relationship status: Not on file  . Intimate partner violence:    Fear of current or ex partner: Not on file    Emotionally abused: Not on file    Physically abused: Not on file    Forced sexual activity: Not on file  Other Topics Concern  . 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   HENT: Negative.   Eyes: Negative.   Respiratory: Negative.   Cardiovascular: Negative.   Gastrointestinal: Negative.   Genitourinary: Negative.   Musculoskeletal: Negative.   Skin: Negative.   Neurological: Negative.   Endo/Heme/Allergies: Negative.   Psychiatric/Behavioral: Negative.   All other systems reviewed and are negative.     BP 130/80   Temp 98.3 F (36.8 C) (Oral)   Wt 206 lb (93.4 kg)   BMI 26.45 kg/m   Physical Exam  Constitutional: He is oriented to person, place, and time. He appears well-developed and well-nourished. No distress.  HENT:  Head: Normocephalic and atraumatic.  Right Ear: External ear normal.  Left Ear: External ear normal.  Nose: Nose normal.  Mouth/Throat: Oropharynx is clear and moist. No oropharyngeal exudate.  Eyes: Pupils are equal, round, and reactive to light. Conjunctivae and EOM are normal. Right eye exhibits no discharge. Left eye exhibits no discharge. No scleral icterus.  Neck: Normal range of motion. Neck supple. No JVD present. No tracheal deviation present. No thyromegaly present.  Cardiovascular: Normal rate, regular rhythm, normal heart sounds and intact distal pulses. Exam reveals no gallop and no friction rub.  No murmur heard. Pulmonary/Chest: Effort normal and breath sounds normal. No stridor. No respiratory distress. He has no wheezes. He has no rales. He exhibits no tenderness.  Abdominal: Soft. Bowel sounds are normal. He exhibits no distension and no mass. There is no tenderness. There is no rebound and no guarding. No hernia.  Musculoskeletal: Normal range of motion. He exhibits no edema, tenderness or deformity.  Lymphadenopathy:    He has no cervical adenopathy.  Neurological: He is alert and oriented to person, place, and time. He displays normal reflexes.  No cranial nerve deficit or sensory deficit. He exhibits normal muscle tone. Coordination normal.  Skin: Skin is warm and dry. No rash noted. He is not diaphoretic. No erythema. No pallor.  Psychiatric: He has a normal mood and affect. Judgment and thought content normal.  Nursing note and vitals reviewed.   Recent Results (from the past 2160 hour(s))  Basic metabolic panel     Status: Abnormal   Collection Time: 04/07/18  8:45 AM  Result Value Ref Range   Glucose 96 65 - 99 mg/dL   BUN 19 8 - 27 mg/dL  Creatinine, Ser 1.40 (H) 0.76 - 1.27 mg/dL   GFR calc non Af Amer 51 (L) >59 mL/min/1.73   GFR calc Af Amer 59 (L) >59 mL/min/1.73   BUN/Creatinine Ratio 14 10 - 24   Sodium 139 134 - 144 mmol/L   Potassium 4.3 3.5 - 5.2 mmol/L   Chloride 99 96 - 106 mmol/L   CO2 25 20 - 29 mmol/L   Calcium 9.4 8.6 - 10.2 mg/dL  CBC     Status: Abnormal   Collection Time: 04/07/18  8:45 AM  Result Value Ref Range   WBC 5.9 3.4 - 10.8 x10E3/uL   RBC 4.68 4.14 - 5.80 x10E6/uL   Hemoglobin 16.8 13.0 - 17.7 g/dL   Hematocrit 46.4 37.5 - 51.0 %   MCV 99 (H) 79 - 97 fL   MCH 35.9 (H) 26.6 - 33.0 pg   MCHC 36.2 (H) 31.5 - 35.7 g/dL   RDW 12.9 12.3 - 15.4 %   Platelets 170 150 - 450 x10E3/uL  Hepatic function panel     Status: None   Collection Time: 04/07/18  8:45 AM  Result Value Ref Range   Total Protein 7.6 6.0 - 8.5 g/dL   Albumin 4.6 3.6 - 4.8 g/dL   Bilirubin Total 1.0 0.0 - 1.2 mg/dL   Bilirubin, Direct 0.25 0.00 - 0.40 mg/dL   Alkaline Phosphatase 62 39 - 117 IU/L   AST 36 0 - 40 IU/L   ALT 31 0 - 44 IU/L  Lipid panel     Status: Abnormal   Collection Time: 04/07/18  8:45 AM  Result Value Ref Range   Cholesterol, Total 119 100 - 199 mg/dL   Triglycerides 158 (H) 0 - 149 mg/dL   HDL 28 (L) >39 mg/dL   VLDL Cholesterol Cal 32 5 - 40 mg/dL   LDL Calculated 59 0 - 99 mg/dL   Chol/HDL Ratio 4.3 0.0 - 5.0 ratio    Comment:                                   T. Chol/HDL Ratio                                              Men  Women                               1/2 Avg.Risk  3.4    3.3                                   Avg.Risk  5.0    4.4                                2X Avg.Risk  9.6    7.1                                3X Avg.Risk 23.4   11.0   TSH     Status: None   Collection Time: 04/07/18  8:45 AM  Result Value Ref Range   TSH  3.670 0.450 - 4.500 uIU/mL    Assessment/Plan: 1. Routine general medical examination at a health care facility - Reviewed blood work done by cardiology this month ( CBC, BMP, TSH, Lipid). He had mild decreased kidney function. Will recheck BMP and check PSA today - Encouraged to work on diet and exercise - Follow up in one year or sooner if needed  2. Prostate cancer screening  - PSA  3. Need for influenza vaccination  - Flu vaccine HIGH DOSE PF (Fluzone High dose)  Dorothyann Peng, NP

## 2018-04-18 NOTE — Patient Instructions (Signed)
It was great meeting you today   We will follow up with you regarding your blood work   Please let me know if you need anything  

## 2018-05-15 ENCOUNTER — Other Ambulatory Visit (INDEPENDENT_AMBULATORY_CARE_PROVIDER_SITE_OTHER): Payer: Medicare Other

## 2018-05-15 DIAGNOSIS — R972 Elevated prostate specific antigen [PSA]: Secondary | ICD-10-CM | POA: Diagnosis not present

## 2018-05-15 LAB — PSA: PSA: 4.02 ng/mL — AB (ref 0.10–4.00)

## 2018-05-17 ENCOUNTER — Other Ambulatory Visit: Payer: Self-pay | Admitting: Family Medicine

## 2018-05-17 DIAGNOSIS — R972 Elevated prostate specific antigen [PSA]: Secondary | ICD-10-CM

## 2018-05-31 ENCOUNTER — Encounter: Payer: Self-pay | Admitting: Adult Health

## 2018-05-31 ENCOUNTER — Other Ambulatory Visit: Payer: Self-pay | Admitting: Adult Health

## 2018-06-01 NOTE — Telephone Encounter (Signed)
Patrick Sosa, will you be taking over this medication?

## 2018-07-03 LAB — PSA: PSA: 3.41

## 2018-07-05 ENCOUNTER — Encounter: Payer: Self-pay | Admitting: Family Medicine

## 2018-07-07 ENCOUNTER — Other Ambulatory Visit: Payer: Self-pay | Admitting: Nurse Practitioner

## 2018-07-13 ENCOUNTER — Other Ambulatory Visit: Payer: Self-pay | Admitting: *Deleted

## 2018-07-18 ENCOUNTER — Other Ambulatory Visit: Payer: Self-pay | Admitting: Family Medicine

## 2018-07-18 DIAGNOSIS — Z1211 Encounter for screening for malignant neoplasm of colon: Secondary | ICD-10-CM

## 2018-07-25 ENCOUNTER — Encounter: Payer: Self-pay | Admitting: Physician Assistant

## 2018-08-07 ENCOUNTER — Telehealth: Payer: Self-pay

## 2018-08-07 ENCOUNTER — Ambulatory Visit (INDEPENDENT_AMBULATORY_CARE_PROVIDER_SITE_OTHER): Payer: Medicare Other | Admitting: Physician Assistant

## 2018-08-07 ENCOUNTER — Encounter: Payer: Self-pay | Admitting: Physician Assistant

## 2018-08-07 VITALS — BP 160/80 | HR 72 | Ht 71.75 in | Wt 208.4 lb

## 2018-08-07 DIAGNOSIS — Z1211 Encounter for screening for malignant neoplasm of colon: Secondary | ICD-10-CM | POA: Diagnosis not present

## 2018-08-07 DIAGNOSIS — Z7901 Long term (current) use of anticoagulants: Secondary | ICD-10-CM

## 2018-08-07 NOTE — Patient Instructions (Signed)
If you are age 71 or older, your body mass index should be between 23-30. Your Body mass index is 28.46 kg/m. If this is out of the aforementioned range listed, please consider follow up with your Primary Care Provider.  If you are age 71 or younger, your body mass index should be between 19-25. Your Body mass index is 28.46 kg/m. If this is out of the aformentioned range listed, please consider follow up with your Primary Care Provider.   You have been scheduled for a colonoscopy. Please follow written instructions given to you at your visit today.  Please pick up your prep supplies at the pharmacy within the next 1-3 days. If you use inhalers (even only as needed), please bring them with you on the day of your procedure. Your physician has requested that you go to www.startemmi.com and enter the access code given to you at your visit today. This web site gives a general overview about your procedure. However, you should still follow specific instructions given to you by our office regarding your preparation for the procedure.  You will be contacted by our office prior to your procedure for directions on holding your Xarelto.  If you do not hear from our office 1 week prior to your scheduled procedure, please call [phone] to discuss.   Thank you for choosing me and [facility].   Amy Esterwood, PA-C

## 2018-08-07 NOTE — Telephone Encounter (Signed)
Letter by Alfredia Ferguson, PA-C on 08/07/2018     [facility] [place], [place]  [address] Phone:  [phone]   Fax:  [phone]   08/07/2018   RE:      Patrick Sosa DOB:   11-May-1946 MRN:   [id number]   Dear Dr. Carlisle Cater,    We have scheduled the above patient for an endoscopic procedure. Our records show that he is on anticoagulation therapy.   Please advise as to whether the patient may come off his therapy of Xarelto one day prior to the colonoscopy procedure, which is scheduled for 08/16/18.  Please fax back/ or route to [place], [place] at [phone].   Sincerely,    Thurmon Fair, [facility]                       [name], [phone]                         1

## 2018-08-07 NOTE — Progress Notes (Signed)
Subjective:    Patient ID: Patrick Sosa, male    DOB: July 19, 1945, 73 y.o.   MRN: [id number]  HPI Ercell is a pleasant 73 year old white male, referred today by Dorothyann Peng NP for follow-up screening colonoscopy. Patient is known remotely to Dr. Sharlett Iles and had colonoscopy in January 2010 with finding of severe pandiverticulosis and internal hemorrhoids, there were no polyps. He currently denies any problems with abdominal pain or cramping, no changes in bowel habits, no constipation diarrhea melena or hematochezia. He denies any issues with chronic GERD or dysphasia. Patient is maintained on Xarelto with history of recurrent DVTs x3.  He has history of factor V Leiden deficiency, polycythemia, and hypertension.  His last DVT was about 7 years ago.  He has come off Xarelto in the past for procedures.  Review of Systems.Pertinent positive and negative review of systems were noted in the above HPI section.  All other review of systems was otherwise negative.  Outpatient Encounter Medications as of 08/07/2018  Medication Sig  . amLODipine (NORVASC) 5 MG tablet TAKE 1 TABLET(5 MG) BY MOUTH DAILY  . dimenhyDRINATE (DRAMAMINE) 50 MG tablet Take 25 mg by mouth at bedtime as needed. As needed for sleep.  . metoprolol tartrate (LOPRESSOR) 25 MG tablet TAKE 1 TABLET(25 MG) BY MOUTH TWICE DAILY  . pravastatin (PRAVACHOL) 20 MG tablet TAKE 1 TABLET BY MOUTH DAILY  . spironolactone (ALDACTONE) 25 MG tablet TAKE 1/2 TABLET(12.5 MG) BY MOUTH DAILY  . tamsulosin (FLOMAX) 0.4 MG CAPS capsule TAKE 1 CAPSULE BY MOUTH EVERY DAY  . XARELTO 20 MG TABS tablet TAKE 1 TABLET(20 MG) BY MOUTH DAILY WITH SUPPER   No facility-administered encounter medications on file as of 08/07/2018.    No Known Allergies Patient Active Problem List   Diagnosis Date Noted  . BPH (benign prostatic hyperplasia) 04/10/2014  . Benign hypertensive heart disease without heart failure 08/27/2013  . Polycythemia   . DVT (deep venous  thrombosis) ([place])   . Hypercholesterolemia   . 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 FACTOR V DEFICIENCY 08/20/2009  . Essential hypertension 08/23/2007   Social History   Socioeconomic History  . Marital status: Married    Spouse name: Not on file  . Number of children: 3  . Years of education: Not on file  . Highest education level: Not on file  Occupational History  . Occupation: retired  Scientific laboratory technician  . Financial resource strain: Not on file  . Food insecurity:    Worry: Not on file    Inability: Not on file  . Transportation needs:    Medical: Not on file    Non-medical: Not on file  Tobacco Use  . Smoking status: Former Smoker    Last attempt to quit: 10/11/1981    Years since quitting: 36.8  . Smokeless tobacco: Never Used  Substance and Sexual Activity  . Alcohol use: No    Alcohol/week: 0.0 standard drinks    Comment: rarely  . Drug use: No  . Sexual activity: Yes  Lifestyle  . Physical activity:    Days per week: Not on file    Minutes per session: Not on file  . Stress: Not on file  Relationships  . Social connections:    Talks on phone: Not on file    Gets together: Not on file    Attends religious service: Not on file    Active member of club or organization: Not on file    Attends meetings of clubs or organizations:  Not on file    Relationship status: Not on file  . Intimate partner violence:    Fear of current or ex partner: Not on file    Emotionally abused: Not on file    Physically abused: Not on file    Forced sexual activity: Not on file  Other Topics Concern  . 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 Hemenway family history includes Alzheimer's disease in his mother; Arthritis in his mother and sister; Breast cancer in his sister; Heart attack in his father; Hypertension in his father and sister; Pancreatic cancer in his  father; Rheum arthritis in his daughter.      Objective:    Vitals:   08/07/18 0900  BP: (!) 160/80  Pulse: 72    Physical Exam; well-developed older white male in no acute distress, pleasant, height 5 foot 11, weight 208, BMI 28.4.  HEENT; nontraumatic normocephalic EOMI PERRLA sclera anicteric, oral mucosa moist, Cardiovascular ;regular rate and rhythm with S1-S2 no murmur rub or gallop, Pulmonary; clear bilaterally, Abdomen; soft, nontender nondistended bowel sounds are active there is no palpable mass or hepatosplenomegaly.  Rectal; exam not done, Extremities ;no clubbing cyanosis or edema skin warm and dry, Neuropsych ;alert and oriented, grossly nonfocal mood and affect appropriate       Assessment & Plan:   #29 73 year old white male, due for screening colonoscopy.  He is currently asymptomatic and of average risk. Last colonoscopy 2010 no polyps  #2 severe pandiverticulosis #3.  Internal hemorrhoids #4.  Chronic anticoagulation-on Xarelto #5.  History of recurrent DVT x3 with underlying hypercoagulable disorder/factor V deficiency #6.  History of polycythemia #7.  Hypertension  Plan; Patient will be scheduled for colonoscopy with Dr. Carlean Purl.  Procedure was discussed in detail with the patient including indications risks and benefits and he is agreeable to proceed. Patient will need to hold Xarelto for 24 hours prior to procedure.  We will communicate with his PCP who prescribes Xarelto/Cory Nafziger NP to assure this is reasonable for this patient.  I did discuss  the very small ,but real possibility of a thrombotic event , with the patient, during the short period that he will be off Xarelto.  Amy Genia Harold PA-C 08/07/2018   Cc: Dorothyann Peng, NP

## 2018-08-08 ENCOUNTER — Encounter: Payer: Self-pay | Admitting: Family Medicine

## 2018-08-08 ENCOUNTER — Telehealth: Payer: Self-pay | Admitting: Physician Assistant

## 2018-08-08 NOTE — Telephone Encounter (Signed)
Called patient back and spoke with him regarding holding Zarelton 24 hours prior to colonoscopy.  Per Effie Berkshire NP, okay to hold 24 hours prior.  Patient verbalized understanding.  Letter from NP scanned to chart.

## 2018-08-08 NOTE — Telephone Encounter (Signed)
Pt returned your called. He did advised that the medication Xarelto was prescribe to him by [facility] and the nurse Vira Agar wrote the medication.

## 2018-08-16 ENCOUNTER — Encounter: Payer: Self-pay | Admitting: Internal Medicine

## 2018-08-16 ENCOUNTER — Ambulatory Visit (AMBULATORY_SURGERY_CENTER): Payer: Medicare Other | Admitting: Internal Medicine

## 2018-08-16 VITALS — BP 124/55 | HR 62 | Temp 98.0°F | Resp 20 | Ht 71.0 in | Wt 208.0 lb

## 2018-08-16 DIAGNOSIS — D120 Benign neoplasm of cecum: Secondary | ICD-10-CM

## 2018-08-16 DIAGNOSIS — Z1211 Encounter for screening for malignant neoplasm of colon: Secondary | ICD-10-CM | POA: Diagnosis not present

## 2018-08-16 DIAGNOSIS — D122 Benign neoplasm of ascending colon: Secondary | ICD-10-CM

## 2018-08-16 MED ORDER — SODIUM CHLORIDE 0.9 % IV SOLN: 500.0000 mL | Freq: Once | INTRAVENOUS | Status: DC

## 2018-08-16 NOTE — Progress Notes (Signed)
Report to PACU, RN, vss, BBS= Clear.  

## 2018-08-16 NOTE — Progress Notes (Signed)
Pt's states no medical or surgical changes since previsit or office visit. 

## 2018-08-16 NOTE — Patient Instructions (Addendum)
I found and removed 2 polyps.  I will let you know pathology results and when to have another routine colonoscopy by mail and/or My Chart.  Restart Xarelto on 08/18/2018.  I appreciate the opportunity to care for you. Gatha Mayer, MD, [facility]  Information on polyps and diverticulosis given to you today.  Await pathology results.  Resume Xarelto at prior dose in two days (Feb 28).  YOU HAD AN ENDOSCOPIC PROCEDURE TODAY AT [facility]:   Refer to the procedure report that was given to you for any specific questions about what was found during the examination.  If the procedure report does not answer your questions, please call your gastroenterologist to clarify.  If you requested that your care partner not be given the details of your procedure findings, then the procedure report has been included in a sealed envelope for you to review at your convenience later.  YOU SHOULD EXPECT: Some feelings of bloating in the abdomen. Passage of more gas than usual.  Walking can help get rid of the air that was put into your GI tract during the procedure and reduce the bloating. If you had a lower endoscopy (such as a colonoscopy or flexible sigmoidoscopy) you may notice spotting of blood in your stool or on the toilet paper. If you underwent a bowel prep for your procedure, you may not have a normal bowel movement for a few days.  Please Note:  You might notice some irritation and congestion in your nose or some drainage.  This is from the oxygen used during your procedure.  There is no need for concern and it should clear up in a day or so.  SYMPTOMS TO REPORT IMMEDIATELY:   Following lower endoscopy (colonoscopy or flexible sigmoidoscopy):  Excessive amounts of blood in the stool  Significant tenderness or worsening of abdominal pains  Swelling of the abdomen that is new, acute  Fever of 100F or higher  For urgent or emergent issues, a gastroenterologist can be reached  at any hour by calling [phone].   DIET:  We do recommend a small meal at first, but then you may proceed to your regular diet.  Drink plenty of fluids but you should avoid alcoholic beverages for 24 hours.  ACTIVITY:  You should plan to take it easy for the rest of today and you should NOT DRIVE or use heavy machinery until tomorrow (because of the sedation medicines used during the test).    FOLLOW UP: Our staff will call the number listed on your records the next business day following your procedure to check on you and address any questions or concerns that you may have regarding the information given to you following your procedure. If we do not reach you, we will leave a message.  However, if you are feeling well and you are not experiencing any problems, there is no need to return our call.  We will assume that you have returned to your regular daily activities without incident.  If any biopsies were taken you will be contacted by phone or by letter within the next 1-3 weeks.  Please call us at [phone] if you have not heard about the biopsies in 3 weeks.    SIGNATURES/CONFIDENTIALITY: You and/or your care partner have signed paperwork which will be entered into your electronic medical record.  These signatures attest to the fact that that the information above on your After Visit Summary has been reviewed and is understood.  Full responsibility of the confidentiality of this discharge information lies with you and/or your care-partner.

## 2018-08-16 NOTE — Op Note (Signed)
[place] Patient Name: Patrick Sosa Procedure Date: 08/16/2018 10:36 AM MRN: [id number] Endoscopist: Gatha Mayer , MD Age: 73 Referring MD:  Date of Birth: [birth year] Gender: Male Account #: [id number] Procedure:                Colonoscopy Indications:              Screening for colorectal malignant neoplasm, Last                            colonoscopy: 2009 Medicines:                Propofol per Anesthesia, Monitored Anesthesia Care Procedure:                Pre-Anesthesia Assessment:                           - Prior to the procedure, a History and Physical                            was performed, and patient medications and                            allergies were reviewed. The patient's tolerance of                            previous anesthesia was also reviewed. The risks                            and benefits of the procedure and the sedation                            options and risks were discussed with the patient.                            All questions were answered, and informed consent                            was obtained. Prior Anticoagulants: The patient                            last took Xarelto (rivaroxaban) 2 days prior to the                            procedure. ASA Grade Assessment: II - A patient                            with mild systemic disease. After reviewing the                            risks and benefits, the patient was deemed in                            satisfactory condition to undergo the procedure.  After obtaining informed consent, the colonoscope                            was passed under direct vision. Throughout the                            procedure, the patient's blood pressure, pulse, and                            oxygen saturations were monitored continuously. The                            Colonoscope was introduced through the anus and                            advanced to the the  cecum, identified by                            appendiceal orifice and ileocecal valve. The                            colonoscopy was performed without difficulty. The                            patient tolerated the procedure well. The quality                            of the bowel preparation was excellent. The                            ileocecal valve, appendiceal orifice, and rectum                            were photographed. The bowel preparation used was                            Miralax. Scope In: 10:51:52 AM Scope Out: 32:35:57 AM Scope Withdrawal Time: 0 hours 17 minutes 19 seconds  Total Procedure Duration: 0 hours 24 minutes 25 seconds  Findings:                 The perianal and digital rectal examinations were                            normal. Pertinent negatives include normal prostate                            (size, shape, and consistency).                           A 12 mm polyp was found in the ascending colon. The                            polyp was sessile. The polyp was removed with a  piecemeal technique using a cold snare. Resection                            and retrieval were complete. Verification of                            patient identification for the specimen was done.                            Estimated blood loss was minimal.                           A 1 mm polyp was found in the cecum. The polyp was                            sessile. The polyp was removed with a cold biopsy                            forceps. Resection and retrieval were complete.                            Verification of patient identification for the                            specimen was done. Estimated blood loss was minimal.                           Multiple diverticula were found in the sigmoid                            colon.                           The exam was otherwise without abnormality on                            direct and  retroflexion views. Complications:            No immediate complications. Estimated Blood Loss:     Estimated blood loss was minimal. Impression:               - One 12 mm polyp in the ascending colon, removed                            piecemeal using a cold snare. Resected and                            retrieved.                           - One 1 mm polyp in the cecum, removed with a cold                            biopsy forceps. Resected and retrieved.                           -  Diverticulosis in the sigmoid colon.                           - The examination was otherwise normal on direct                            and retroflexion views. Recommendation:           - Patient has a contact number available for                            emergencies. The signs and symptoms of potential                            delayed complications were discussed with the                            patient. Return to normal activities tomorrow.                            Written discharge instructions were provided to the                            patient.                           - Resume previous diet.                           - Continue present medications.                           - Resume Xarelto (rivaroxaban) at prior dose in 2                            days. 08/18/2018                           - Repeat colonoscopy is recommended. The                            colonoscopy date will be determined after pathology                            results from today's exam become available for                            review. Gatha Mayer, MD 08/16/2018 11:25:15 AM This report has been signed electronically.

## 2018-08-16 NOTE — Progress Notes (Signed)
Called to room to assist during endoscopic procedure.  Patient ID and intended procedure confirmed with present staff. Received instructions for my participation in the procedure from the performing physician.  

## 2018-08-17 ENCOUNTER — Telehealth: Payer: Self-pay

## 2018-08-17 NOTE — Telephone Encounter (Signed)
  Follow up Call-  Call back number 08/16/2018  Post procedure Call Back phone  # [phone]  Permission to leave phone message Yes  Some recent data might be hidden     Patient questions:  Do you have a fever, pain , or abdominal swelling? No. Pain Score  0 *  Have you tolerated food without any problems? Yes.    Have you been able to return to your normal activities? Yes.    Do you have any questions about your discharge instructions: Diet   No. Medications  No. Follow up visit  No.  Do you have questions or concerns about your Care? No.  Actions: * If pain score is 4 or above: No action needed, pain <4.

## 2018-08-20 ENCOUNTER — Encounter: Payer: Self-pay | Admitting: Internal Medicine

## 2018-08-20 DIAGNOSIS — Z860101 Personal history of adenomatous and serrated colon polyps: Secondary | ICD-10-CM | POA: Insufficient documentation

## 2018-08-20 DIAGNOSIS — Z86010 Personal history of colonic polyps: Secondary | ICD-10-CM | POA: Insufficient documentation

## 2018-08-20 HISTORY — DX: Personal history of adenomatous and serrated colon polyps: Z86.0101

## 2018-08-20 HISTORY — DX: Personal history of colonic polyps: Z86.010

## 2018-08-20 NOTE — Progress Notes (Signed)
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

## 2018-08-28 ENCOUNTER — Other Ambulatory Visit: Payer: Self-pay | Admitting: Nurse Practitioner

## 2018-09-25 ENCOUNTER — Telehealth: Payer: Self-pay | Admitting: Nurse Practitioner

## 2018-09-25 NOTE — Telephone Encounter (Signed)
Agrees to virtual visit for Tuesday April 14th at 8 am.   Patient has mychart account.  Burtis Junes, RN, [facility] [address] [place], [place]  [address] [phone]

## 2018-09-28 ENCOUNTER — Telehealth: Payer: Self-pay | Admitting: *Deleted

## 2018-09-28 NOTE — Telephone Encounter (Signed)
Virtual Visit Pre-Appointment Phone Call  Steps For Call:  1. Confirm consent - "In the setting of the current Covid19 crisis, you are scheduled for a (Doximity) visit with your provider on (Tuesday, April 14) at (8:00 am).  Just as we do with many in-office visits, in order for you to participate in this visit, we must obtain consent.  If you'd like, I can send this to your mychart (if signed up) or email for you to review.  Otherwise, I can obtain your verbal consent now.  All virtual visits are billed to your insurance company just like a normal visit would be.  By agreeing to a virtual visit, we'd like you to understand that the technology does not allow for your provider to perform an examination, and thus may limit your provider's ability to fully assess your condition.  Finally, though the technology is pretty good, we cannot assure that it will always work on either your or our end, and in the setting of a video visit, we may have to convert it to a phone-only visit.  In either situation, we cannot ensure that we have a secure connection.  Are you willing to proceed?"  2. Give patient instructions for WebEx download to smartphone as below if video visit  3. Advise patient to be prepared with any vital sign or heart rhythm information, their current medicines, and a piece of paper and pen handy for any instructions they may receive the day of their visit  4. Inform patient they will receive a phone call 15 minutes prior to their appointment time (may be from unknown caller ID) so they should be prepared to answer  5. Confirm that appointment type is correct in Epic appointment notes (video vs telephone)    TELEPHONE CALL NOTE  Patrick Sosa has been deemed a candidate for a follow-up tele-health visit to limit community exposure during the Covid-19 pandemic. I spoke with the patient via phone to ensure availability of phone/video source, confirm preferred email & phone number, and  discuss instructions and expectations.  I reminded Patrick Sosa to be prepared with any vital sign and/or heart rhythm information that could potentially be obtained via home monitoring, at the time of his visit. I reminded Patrick Sosa to expect a phone call at the time of his visit if his visit.  Did the patient verbally acknowledge consent to treatment? Yes.  Patrick Sosa Avanell Shackleton 09/28/2018 10:04 AM   DOWNLOADING THE [place], go to [employer] and type in WebEx in the search bar. [place] [employer], the blue/green circle. The app is free but as with any other app downloads, their phone may require them to verify saved payment information or Apple password. The patient does NOT have to create an account.  - If Android, ask patient to go to [employer] and type in WebEx in the search bar. [place] [employer], the blue/green circle. The app is free but as with any other app downloads, their phone may require them to verify saved payment information or Android password. The patient does NOT have to create an account.   CONSENT FOR TELE-HEALTH VISIT - PLEASE REVIEW  I hereby voluntarily request, consent and authorize [name] and its employed or contracted physicians, Engineer, materials, nurse practitioners or other licensed health care professionals Truitt Merle, NP), to provide me with telemedicine health care services (Doximity) as deemed necessary by the treating Practitioner.  I acknowledge and consent to receive the Services by the Practitioner via telemedicine. I understand that the telemedicine visit will involve communicating with the Practitioner through live audiovisual communication technology and the disclosure of certain medical information by electronic transmission. I acknowledge that I have been given the opportunity to request an in-person assessment or other available alternative prior to the telemedicine visit and  am voluntarily participating in the telemedicine visit.  I understand that I have the right to withhold or withdraw my consent to the use of telemedicine in the course of my care at any time, without affecting my right to future care or treatment, and that the Practitioner or I may terminate the telemedicine visit at any time. I understand that I have the right to inspect all information obtained and/or recorded in the course of the telemedicine visit and may receive copies of available information for a reasonable fee.  I understand that some of the potential risks of receiving the Services via telemedicine include:  Marland Kitchen Delay or interruption in medical evaluation due to technological equipment failure or disruption; . Information transmitted may not be sufficient (e.g. poor resolution of images) to allow for appropriate medical decision making by the Practitioner; and/or  . In rare instances, security protocols could fail, causing a breach of personal health information.  Furthermore, I acknowledge that it is my responsibility to provide information about my medical history, conditions and care that is complete and accurate to the best of my ability. I acknowledge that Practitioner's advice, recommendations, and/or decision may be based on factors not within their control, such as incomplete or inaccurate data provided by me or distortions of diagnostic images or specimens that may result from electronic transmissions. I understand that the practice of medicine is not an exact science and that Practitioner makes no warranties or guarantees regarding treatment outcomes. I acknowledge that I will receive a copy of this consent concurrently upon execution via email to the email address I last provided but may also request a printed copy by calling the office of [place].    I understand that my insurance will be billed for this visit.   I have read or had this consent read to me. . I understand the  contents of this consent, which adequately explains the benefits and risks of the Services being provided via telemedicine.  . I have been provided ample opportunity to ask questions regarding this consent and the Services and have had my questions answered to my satisfaction. . I give my informed consent for the services to be provided through the use of telemedicine in my medical care  By participating in this telemedicine visit I agree to the above.

## 2018-10-02 ENCOUNTER — Telehealth: Payer: Self-pay | Admitting: *Deleted

## 2018-10-02 NOTE — Telephone Encounter (Signed)
Virtual Visit Pre-Appointment Phone Call  Steps For Call:  1. Confirm consent - "In the setting of the current Covid19 crisis, you are scheduled for a (phone or video) visit with your provider on (Tuesday, April 14) at (8:00 am).  Just as we do with many in-office visits, in order for you to participate in this visit, we must obtain consent.  If you'd like, I can send this to your mychart (if signed up) or email for you to review.  Otherwise, I can obtain your verbal consent now.  All virtual visits are billed to your insurance company just like a normal visit would be.  By agreeing to a virtual visit, we'd like you to understand that the technology does not allow for your provider to perform an examination, and thus may limit your provider's ability to fully assess your condition.  Finally, though the technology is pretty good, we cannot assure that it will always work on either your or our end, and in the setting of a video visit, we may have to convert it to a phone-only visit.  In either situation, we cannot ensure that we have a secure connection.  Are you willing to proceed?"  2. Give patient instructions for WebEx download to smartphone as below if video visit  3. Advise patient to be prepared with any vital sign or heart rhythm information, their current medicines, and a piece of paper and pen handy for any instructions they may receive the day of their visit  4. Inform patient they will receive a phone call 15 minutes prior to their appointment time (may be from unknown caller ID) so they should be prepared to answer  5. Confirm that appointment type is correct in Epic appointment notes (video vs telephone)    TELEPHONE CALL NOTE  Patrick Sosa has been deemed a candidate for a follow-up tele-health visit to limit community exposure during the Covid-19 pandemic. I spoke with the patient via phone to ensure availability of phone/video source, confirm preferred email & phone number, and  discuss instructions and expectations.  I reminded [name] to be prepared with any vital sign and/or heart rhythm information that could potentially be obtained via home monitoring, at the time of his visit. I reminded CHRISTINA WALDROP to expect a phone call at the time of his visit if his visit.  Did the patient verbally acknowledge consent to treatment? Yes.  Talbert Trembath Avanell Shackleton 10/02/2018 4:01 PM   DOWNLOADING THE [place], go to [employer] and type in WebEx in the search bar. [place] [employer], the blue/green circle. The app is free but as with any other app downloads, their phone may require them to verify saved payment information or Apple password. The patient does NOT have to create an account.  - If Android, ask patient to go to [employer] and type in WebEx in the search bar. [place] [employer], the blue/green circle. The app is free but as with any other app downloads, their phone may require them to verify saved payment information or Android password. The patient does NOT have to create an account.   CONSENT FOR TELE-HEALTH VISIT - PLEASE REVIEW  I hereby voluntarily request, consent and authorize [name] and its employed or contracted physicians, physician assistants, nurse practitioners or other licensed health care professionals (the Practitioner), to provide me with telemedicine health care services (the "Services") as deemed necessary by the  treating Practitioner. I acknowledge and consent to receive the Services by the Practitioner via telemedicine. I understand that the telemedicine visit will involve communicating with the Practitioner through live audiovisual communication technology and the disclosure of certain medical information by electronic transmission. I acknowledge that I have been given the opportunity to request an in-person assessment or other available alternative prior to the telemedicine  visit and am voluntarily participating in the telemedicine visit.  I understand that I have the right to withhold or withdraw my consent to the use of telemedicine in the course of my care at any time, without affecting my right to future care or treatment, and that the Practitioner or I may terminate the telemedicine visit at any time. I understand that I have the right to inspect all information obtained and/or recorded in the course of the telemedicine visit and may receive copies of available information for a reasonable fee.  I understand that some of the potential risks of receiving the Services via telemedicine include:  Marland Kitchen Delay or interruption in medical evaluation due to technological equipment failure or disruption; . Information transmitted may not be sufficient (e.g. poor resolution of images) to allow for appropriate medical decision making by the Practitioner; and/or  . In rare instances, security protocols could fail, causing a breach of personal health information.  Furthermore, I acknowledge that it is my responsibility to provide information about my medical history, conditions and care that is complete and accurate to the best of my ability. I acknowledge that Practitioner's advice, recommendations, and/or decision may be based on factors not within their control, such as incomplete or inaccurate data provided by me or distortions of diagnostic images or specimens that may result from electronic transmissions. I understand that the practice of medicine is not an exact science and that Practitioner makes no warranties or guarantees regarding treatment outcomes. I acknowledge that I will receive a copy of this consent concurrently upon execution via email to the email address I last provided but may also request a printed copy by calling the office of [place].    I understand that my insurance will be billed for this visit.   I have read or had this consent read to me. . I  understand the contents of this consent, which adequately explains the benefits and risks of the Services being provided via telemedicine.  . I have been provided ample opportunity to ask questions regarding this consent and the Services and have had my questions answered to my satisfaction. . I give my informed consent for the services to be provided through the use of telemedicine in my medical care  By participating in this telemedicine visit I agree to the above.

## 2018-10-02 NOTE — Progress Notes (Signed)
Telehealth Visit     Virtual Visit via Video Note   This visit type was conducted due to national recommendations for restrictions regarding the COVID-19 Pandemic (e.g. social distancing) in an effort to limit this patient's exposure and mitigate transmission in our community.  Due to his co-morbid illnesses, this patient is at least at moderate risk for complications without adequate follow up.  This format is felt to be most appropriate for this patient at this time.  All issues noted in this document were discussed and addressed.  A limited physical exam was performed with this format.  Please refer to the patient's chart for his consent to telehealth for [facility].   Evaluation Performed:  Follow up visit.   This visit type was conducted due to national recommendations for restrictions regarding the COVID-19 Pandemic (e.g. social distancing).  This format is felt to be most appropriate for this patient at this time.  All issues noted in this document were discussed and addressed.  No physical exam was performed (except for noted visual exam findings with Video Visits).  Please refer to the patient's chart (MyChart message for video visits and phone note for telephone visits) for the patient's consent to telehealth for [facility].  Date:  10/03/2018   ID:  Patrick Sosa, DOB Jan 18, 1946, MRN [id number]  Patient Location:  Home  Provider location:   Home  PCP:  Dorothyann Peng, NP  Cardiologist:  Servando Snare & No primary care provider on file.  Electrophysiologist:  None   Chief Complaint:  6 month check.   History of Present Illness:    Patrick Sosa is a 73 y.o. male who presents via audio/video conferencing for a telehealth visit today.  Former patient of Dr. Sherryl Barters. Heprimarilyfollows with me. This was to be a 6 month check.   He has a history of essential hypertension, DVT, HLD, polycythemia and is on chronic Xarelto. He is no longer able to donate blood to the [employer] because he is on Xarelto. 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 He does not have a known history of CAD.   Last seen by me back in October of 2019 and he was doing well from our standpoint. His PCP had retired.   The patient does not have symptoms concerning for COVID-19 infection (fever, chills, cough, or new shortness of breath).   Seen today via Doximity video. He has consented for this visit. Doing well. No problems reported. He denies chest pain. Not short of breath. Not dizzy or lightheaded. No passing out. BP is great. Weight is down a few pounds. He is practicing social distancing and has been staying at home. No bleeding/excessive bruising. Did have his colonoscopy back in January - found 2 polyps and he will have repeat study in about 3 years. Overall, he feels well and has no concerns.   Past Medical History:  Diagnosis Date  . Chronic anticoagulation   . Diverticulosis   . DVT (deep venous thrombosis) ([place])   . Factor V deficiency ([place])   . Hx of adenomatous colonic polyps 08/20/2018  . Hypercholesterolemia   . Hypertension   . Kidney stones   . Pneumonia   . Polycythemia    Past Surgical History:  Procedure Laterality Date  . CATARACT EXTRACTION, BILATERAL Bilateral 2018  . COLONOSCOPY    . TONSILLECTOMY       Current Meds  Medication Sig  . amLODipine (NORVASC) 5 MG tablet TAKE 1 TABLET(5 MG)  BY MOUTH DAILY  . dimenhyDRINATE (DRAMAMINE) 50 MG tablet Take 25 mg by mouth at bedtime as needed. As needed for sleep.  . metoprolol tartrate (LOPRESSOR) 25 MG tablet TAKE 1 TABLET(25 MG) BY MOUTH TWICE DAILY  . pravastatin (PRAVACHOL) 20 MG tablet TAKE 1 TABLET BY MOUTH DAILY  . spironolactone (ALDACTONE) 25 MG tablet TAKE 1/2 TABLET(12.5 MG) BY MOUTH DAILY  . tamsulosin (FLOMAX) 0.4 MG CAPS capsule TAKE 1 CAPSULE BY MOUTH EVERY DAY  . XARELTO 20 MG TABS tablet TAKE 1 TABLET(20 MG) BY MOUTH DAILY WITH SUPPER     Allergies:   Patient has no  known allergies.   Social History   Tobacco Use  . Smoking status: Former Smoker    Last attempt to quit: 10/11/1981    Years since quitting: 37.0  . Smokeless tobacco: Never Used  Substance Use Topics  . Alcohol use: No    Alcohol/week: 0.0 standard drinks    Comment: rarely  . Drug use: No     Family Hx: The patient's family history includes Alzheimer's disease in his mother; Arthritis in his mother and sister; Breast cancer in his sister; Heart attack in his father; Hypertension in his father and sister; Pancreatic cancer in his father; Rheum arthritis in his daughter.  ROS:   Please see the history of present illness.   All other systems reviewed are negative except for none.    Objective:    Vital Signs:  BP 130/81   Pulse 72   Ht 6' (1.829 m)   Wt 202 lb (91.6 kg)   BMI 27.40 kg/m    Wt Readings from Last 3 Encounters:  10/03/18 202 lb (91.6 kg)  08/16/18 208 lb (94.3 kg)  08/07/18 208 lb 6 oz (94.5 kg)    Alert. He is in no acute distress. Color looks good. Not short of breath with conversation.    Labs/Other Tests and Data Reviewed:    Lab Results  Component Value Date   WBC 5.9 04/07/2018   HGB 16.8 04/07/2018   HCT 46.4 04/07/2018   PLT 170 04/07/2018   GLUCOSE 90 04/18/2018   CHOL 119 04/07/2018   TRIG 158 (H) 04/07/2018   HDL 28 (L) 04/07/2018   LDLDIRECT 65.4 02/23/2012   LDLCALC 59 04/07/2018   ALT 31 04/07/2018   AST 36 04/07/2018   NA 137 04/18/2018   K 4.0 04/18/2018   CL 101 04/18/2018   CREATININE 1.18 04/18/2018   BUN 17 04/18/2018   CO2 29 04/18/2018   TSH 3.670 04/07/2018   PSA 3.41 07/03/2018   INR 1.1 ratio (H) 08/15/2009     BNP (last 3 results) No results for input(s): BNP in the last 8760 hours.  ProBNP (last 3 results) No results for input(s): PROBNP in the last 8760 hours.    Prior CV studies:    The following studies were reviewed today:  N/A   ASSESSMENT & PLAN:    1. HTN -BP fine on his current  regimen. No changes made today. We will plan on obtaining his surveillance labs once the COVID 19 crisis has passed. Tentatively planned for labs in August.   2. Factor V Leiden deficiency - requires long-term anticoagulation - he remains on Xarelto.No bleeding/bruising.    3. 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  4. Polycythemia - his last CBC was fine.   5. HLD - on statin therapy  6. COVID-19 Education: The signs and symptoms of COVID-19 were  discussed with the patient and how to seek care for testing (follow up with PCP or arrange E-visit).  The importance of social distancing, staying at home and hand hygiene were discussed today.  Patient Risk:   After full review of this patient's clinical status, I feel that they are at least moderate risk at this time.  Time:   Today, I have spent 10 minutes with the patient with telehealth technology discussing the above issues.     Medication Adjustments/Labs and Tests Ordered: Current medicines are reviewed at length with the patient today.  Concerns regarding medicines are outlined above.   Tests Ordered: Orders Placed This Encounter  Procedures  . Basic metabolic panel  . Hepatic function panel  . Lipid panel  . CBC with Differential/Platelet    Medication Changes: No orders of the defined types were placed in this encounter.   Disposition:  FU with labs in August. I will see him back in 6 months.   Patient is agreeable to this plan and will call if any problems develop in the interim.   Amie Critchley, NP  10/03/2018 8:07 AM    [place]

## 2018-10-03 ENCOUNTER — Telehealth (INDEPENDENT_AMBULATORY_CARE_PROVIDER_SITE_OTHER): Payer: Medicare Other | Admitting: Nurse Practitioner

## 2018-10-03 ENCOUNTER — Encounter: Payer: Self-pay | Admitting: Nurse Practitioner

## 2018-10-03 ENCOUNTER — Other Ambulatory Visit: Payer: Self-pay

## 2018-10-03 VITALS — BP 130/81 | HR 72 | Ht 72.0 in | Wt 202.0 lb

## 2018-10-03 DIAGNOSIS — I119 Hypertensive heart disease without heart failure: Secondary | ICD-10-CM

## 2018-10-03 DIAGNOSIS — Z79899 Other long term (current) drug therapy: Secondary | ICD-10-CM

## 2018-10-03 DIAGNOSIS — E7800 Pure hypercholesterolemia, unspecified: Secondary | ICD-10-CM

## 2018-10-03 DIAGNOSIS — D6851 Activated protein C resistance: Secondary | ICD-10-CM

## 2018-10-03 DIAGNOSIS — Z7189 Other specified counseling: Secondary | ICD-10-CM

## 2018-10-03 DIAGNOSIS — Z7901 Long term (current) use of anticoagulants: Secondary | ICD-10-CM

## 2018-10-03 NOTE — Patient Instructions (Addendum)
After Visit Summary:  We will be checking the following labs today - NONE  I would like to get fasting labs in early August - BMET, CBC with diff, HPF and lipids   Medication Instructions:    Continue with your current medicines.    If you need a refill on your cardiac medications before your next appointment, please call your pharmacy.     Testing/Procedures To Be Arranged:  N/A  Follow-Up:   See me in 6 months.     At [facility], you and your health needs are our priority.  As part of our continuing mission to provide you with exceptional heart care, we have created designated Provider Care Teams.  These Care Teams include your primary Cardiologist (physician) and Advanced Practice Providers (APPs -  Physician Assistants and Nurse Practitioners) who all work together to provide you with the care you need, when you need it.  Special Instructions:  . Stay safe, stay home and wash your hands for at least 20 seconds! . It was good to see and talk with you today.  . Stay active.  . Call us for any excessive bruising or any bleeding.   Call the [place] office at [phone] if you have any questions, problems or concerns.

## 2018-11-24 ENCOUNTER — Other Ambulatory Visit: Payer: Self-pay | Admitting: Nurse Practitioner

## 2018-11-26 ENCOUNTER — Other Ambulatory Visit: Payer: Self-pay | Admitting: Nurse Practitioner

## 2018-12-28 ENCOUNTER — Encounter: Payer: Self-pay | Admitting: Family Medicine

## 2019-01-31 ENCOUNTER — Other Ambulatory Visit: Payer: Medicare Other | Admitting: *Deleted

## 2019-01-31 ENCOUNTER — Other Ambulatory Visit: Payer: Self-pay

## 2019-01-31 DIAGNOSIS — Z79899 Other long term (current) drug therapy: Secondary | ICD-10-CM

## 2019-01-31 DIAGNOSIS — Z7901 Long term (current) use of anticoagulants: Secondary | ICD-10-CM

## 2019-01-31 DIAGNOSIS — E7800 Pure hypercholesterolemia, unspecified: Secondary | ICD-10-CM

## 2019-01-31 DIAGNOSIS — I119 Hypertensive heart disease without heart failure: Secondary | ICD-10-CM

## 2019-01-31 LAB — CBC WITH DIFFERENTIAL/PLATELET
Basophils Absolute: 0 10*3/uL (ref 0.0–0.2)
Basos: 1 %
EOS (ABSOLUTE): 0.3 10*3/uL (ref 0.0–0.4)
Eos: 5 %
Hematocrit: 46 % (ref 37.5–51.0)
Hemoglobin: 16.2 g/dL (ref 13.0–17.7)
Immature Grans (Abs): 0 10*3/uL (ref 0.0–0.1)
Immature Granulocytes: 0 %
Lymphocytes Absolute: 1.6 10*3/uL (ref 0.7–3.1)
Lymphs: 28 %
MCH: 35.1 pg — ABNORMAL HIGH (ref 26.6–33.0)
MCHC: 35.2 g/dL (ref 31.5–35.7)
MCV: 100 fL — ABNORMAL HIGH (ref 79–97)
Monocytes Absolute: 0.6 10*3/uL (ref 0.1–0.9)
Monocytes: 10 %
Neutrophils Absolute: 3.4 10*3/uL (ref 1.4–7.0)
Neutrophils: 56 %
Platelets: 173 10*3/uL (ref 150–450)
RBC: 4.61 x10E6/uL (ref 4.14–5.80)
RDW: 11.2 % — ABNORMAL LOW (ref 11.6–15.4)
WBC: 6 10*3/uL (ref 3.4–10.8)

## 2019-01-31 LAB — LIPID PANEL
Chol/HDL Ratio: 4.5 ratio (ref 0.0–5.0)
Cholesterol, Total: 126 mg/dL (ref 100–199)
HDL: 28 mg/dL — ABNORMAL LOW (ref >39)
LDL Calculated: 64 mg/dL (ref 0–99)
Triglycerides: 171 mg/dL — ABNORMAL HIGH (ref 0–149)
VLDL Cholesterol Cal: 34 mg/dL (ref 5–40)

## 2019-01-31 LAB — HEPATIC FUNCTION PANEL
ALT: 27 IU/L (ref 0–44)
AST: 33 IU/L (ref 0–40)
Albumin: 4.6 g/dL (ref 3.8–4.8)
Alkaline Phosphatase: 57 IU/L (ref 39–117)
Bilirubin Total: 0.7 mg/dL (ref 0.0–1.2)
Bilirubin, Direct: 0.15 mg/dL (ref 0.00–0.40)
Total Protein: 7.4 g/dL (ref 6.0–8.5)

## 2019-01-31 LAB — BASIC METABOLIC PANEL
BUN/Creatinine Ratio: 13 (ref 10–24)
BUN: 16 mg/dL (ref 8–27)
CO2: 24 mmol/L (ref 20–29)
Calcium: 9.2 mg/dL (ref 8.6–10.2)
Chloride: 100 mmol/L (ref 96–106)
Creatinine, Ser: 1.26 mg/dL (ref 0.76–1.27)
GFR calc Af Amer: 66 mL/min/{1.73_m2} (ref >59)
GFR calc non Af Amer: 57 mL/min/{1.73_m2} — ABNORMAL LOW (ref >59)
Glucose: 90 mg/dL (ref 65–99)
Potassium: 4.5 mmol/L (ref 3.5–5.2)
Sodium: 138 mmol/L (ref 134–144)

## 2019-02-21 ENCOUNTER — Other Ambulatory Visit: Payer: Self-pay | Admitting: Nurse Practitioner

## 2019-02-21 NOTE — Telephone Encounter (Signed)
Pt last saw Truitt Merle, NP on 10/03/18, last labs 01/31/19 Creat 1.26, age 73, weight 91.6kg, CrCl 70.68, based on CrCl pt is on appropriate dosage of Xarelto 20mg  QD.  Will refill rx.

## 2019-02-24 ENCOUNTER — Other Ambulatory Visit: Payer: Self-pay | Admitting: Nurse Practitioner

## 2019-03-29 NOTE — Progress Notes (Signed)
CARDIOLOGY OFFICE NOTE  Date:  04/03/2019    Patrick Sosa Date of Birth: [birth year] Medical Record [id number]  PCP:  Dorothyann Peng, NP  Cardiologist:  Servando Snare     Chief Complaint  Patient presents with  . Follow-up    History of Present Illness: Patrick Sosa is a 73 y.o. male who presents today for a 6 month check. Former patient of Dr. Sherryl Barters. Heprimarilyfollows with me. This was to be a 6 month check.   He has a history of essential hypertension, DVT, HLD, polycythemia and is on chronic Xarelto. He is no longer able to donate blood to the [employer] because he is on Xarelto. 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 He does not have a known history of CAD.   Last seen by me in the office back inOctober of 2019 and he was doing well from our standpoint. His PCP had retired. We did a telehealth visit back in April - he was doing well. Had had his colonoscopy earlier this year and had some polyps and needs repeat study in about 3 years.   The patient does not have symptoms concerning for COVID-19 infection (fever, chills, cough, or new shortness of breath).   Comes in today. Here alone. He is doing ok. No chest pain. Not short of breath. Had labs back in August - these looked good. Tolerating his medicines. Has had his flu shot. Lots of challenges with COVID and his church and grand kids. He feels like he is ok. BP is typically lower at home. He is exercising regularly.   Past Medical History:  Diagnosis Date  . Chronic anticoagulation   . Diverticulosis   . DVT (deep venous thrombosis) ([place])   . Factor V deficiency ([place])   . Hx of adenomatous colonic polyps 08/20/2018  . Hypercholesterolemia   . Hypertension   . Kidney stones   . Pneumonia   . Polycythemia     Past Surgical History:  Procedure Laterality Date  . CATARACT EXTRACTION, BILATERAL Bilateral 2018  . COLONOSCOPY    . TONSILLECTOMY       Medications: Current Meds   Medication Sig  . alfuzosin (UROXATRAL) 10 MG 24 hr tablet Take 10 mg by mouth daily with breakfast.  . amLODipine (NORVASC) 5 MG tablet TAKE 1 TABLET(5 MG) BY MOUTH DAILY  . dimenhyDRINATE (DRAMAMINE) 50 MG tablet Take 25 mg by mouth at bedtime as needed. As needed for sleep.  . metoprolol tartrate (LOPRESSOR) 25 MG tablet TAKE 1 TABLET(25 MG) BY MOUTH TWICE DAILY  . pravastatin (PRAVACHOL) 20 MG tablet TAKE 1 TABLET BY MOUTH DAILY  . spironolactone (ALDACTONE) 25 MG tablet TAKE 1/2 TABLET(12.5 MG) BY MOUTH DAILY  . XARELTO 20 MG TABS tablet TAKE 1 TABLET(20 MG) BY MOUTH DAILY WITH SUPPER     Allergies: No Known Allergies  Social History: The patient  reports that he quit smoking about 37 years ago. He has never used smokeless tobacco. He reports that he does not drink alcohol or use drugs.   Family History: The patient's family history includes Alzheimer's disease in his mother; Arthritis in his mother and sister; Breast cancer in his sister; Heart attack in his father; Hypertension in his father and sister; Pancreatic cancer in his father; Rheum arthritis in his daughter.   Review of Systems: Please see the history of present illness.   All other systems are reviewed and negative.   Physical Exam: VS:  BP  140/78   Pulse 62   Ht 6' (1.829 m)   Wt 207 lb (93.9 kg)   SpO2 97%   BMI 28.07 kg/m  .  BMI Body mass index is 28.07 kg/m.  Wt Readings from Last 3 Encounters:  04/03/19 207 lb (93.9 kg)  10/03/18 202 lb (91.6 kg)  08/16/18 208 lb (94.3 kg)    General: Pleasant. Well developed, well nourished and in no acute distress.   HEENT: Normal.  Neck: Supple, no JVD, carotid bruits, or masses noted.  Cardiac: Regular rate and rhythm. No murmurs, rubs, or gallops. No edema.  Respiratory:  Lungs are clear to auscultation bilaterally with normal work of breathing.  GI: Soft and nontender.  MS: No deformity or atrophy. Gait and ROM intact.  Skin: Warm and dry. Color is normal.   Neuro:  Strength and sensation are intact and no gross focal deficits noted.  Psych: Alert, appropriate and with normal affect.   LABORATORY DATA:  EKG:  EKG is ordered today. This demonstrates NSR.  Lab Results  Component Value Date   WBC 6.0 01/31/2019   HGB 16.2 01/31/2019   HCT 46.0 01/31/2019   PLT 173 01/31/2019   GLUCOSE 90 01/31/2019   CHOL 126 01/31/2019   TRIG 171 (H) 01/31/2019   HDL 28 (L) 01/31/2019   LDLDIRECT 65.4 02/23/2012   LDLCALC 64 01/31/2019   ALT 27 01/31/2019   AST 33 01/31/2019   NA 138 01/31/2019   K 4.5 01/31/2019   CL 100 01/31/2019   CREATININE 1.26 01/31/2019   BUN 16 01/31/2019   CO2 24 01/31/2019   TSH 3.670 04/07/2018   PSA 3.41 07/03/2018   INR 1.1 ratio (H) 08/15/2009     BNP (last 3 results) No results for input(s): BNP in the last 8760 hours.  ProBNP (last 3 results) No results for input(s): PROBNP in the last 8760 hours.   Other Studies Reviewed Today:   Assessment/Plan:  1. HTN - BP is typically better at home - he will continue to monitor. No changes made today.   2. Factor V Leiden deficiency - on long term anticoagulation - labs from August noted.   3. History of recurrent DVT - on lifelong anticoagulation.   4. Polycythemia - most recent CBC noted.   5. HLD - on statin - labs look good.   6. COVID-19 Education: The signs and symptoms of COVID-19 were discussed with the patient and how to seek care for testing (follow up with PCP or arrange E-visit).  The importance of social distancing, staying at home, hand hygiene and wearing a mask when out in public were discussed today.  Current medicines are reviewed with the patient today.  The patient does not have concerns regarding medicines other than what has been noted above.  The following changes have been made:  See above.  Labs/ tests ordered today include:    Orders Placed This Encounter  Procedures  . EKG 12-Lead     Disposition:   FU with me in 6  months.    Patient is agreeable to this plan and will call if any problems develop in the interim.   SignedTruitt Merle, NP  04/03/2019 9:42 AM  [place] [address] [place], [place]  [address] Phone: [phone] Fax: [phone]

## 2019-04-03 ENCOUNTER — Encounter: Payer: Self-pay | Admitting: Nurse Practitioner

## 2019-04-03 ENCOUNTER — Ambulatory Visit (INDEPENDENT_AMBULATORY_CARE_PROVIDER_SITE_OTHER): Payer: Medicare Other | Admitting: Nurse Practitioner

## 2019-04-03 ENCOUNTER — Other Ambulatory Visit: Payer: Self-pay

## 2019-04-03 VITALS — BP 140/78 | HR 62 | Ht 72.0 in | Wt 207.0 lb

## 2019-04-03 DIAGNOSIS — I119 Hypertensive heart disease without heart failure: Secondary | ICD-10-CM

## 2019-04-03 DIAGNOSIS — D6851 Activated protein C resistance: Secondary | ICD-10-CM | POA: Diagnosis not present

## 2019-04-03 DIAGNOSIS — E7800 Pure hypercholesterolemia, unspecified: Secondary | ICD-10-CM | POA: Diagnosis not present

## 2019-04-03 DIAGNOSIS — Z7901 Long term (current) use of anticoagulants: Secondary | ICD-10-CM

## 2019-04-03 DIAGNOSIS — Z7189 Other specified counseling: Secondary | ICD-10-CM

## 2019-04-03 DIAGNOSIS — Z79899 Other long term (current) drug therapy: Secondary | ICD-10-CM

## 2019-04-03 NOTE — Patient Instructions (Addendum)

## 2019-05-07 ENCOUNTER — Other Ambulatory Visit: Payer: Self-pay | Admitting: Nurse Practitioner

## 2019-05-07 MED ORDER — SPIRONOLACTONE 25 MG PO TABS: ORAL_TABLET | ORAL | 3 refills | Status: DC

## 2019-05-23 ENCOUNTER — Other Ambulatory Visit: Payer: Self-pay | Admitting: Family Medicine

## 2019-07-17 ENCOUNTER — Ambulatory Visit: Payer: Medicare Other

## 2019-07-26 ENCOUNTER — Ambulatory Visit: Payer: Medicare Other

## 2019-07-30 ENCOUNTER — Other Ambulatory Visit: Payer: Self-pay | Admitting: Nurse Practitioner

## 2019-07-30 NOTE — Telephone Encounter (Signed)
Xarelto 20mg  refill request received. Pt is 74 years old, weight-93.9 kg, Crea-1.26 on 01/31/2019, last seen by Cecille Rubin, NP on 04/03/2019, Diagnosis-DVT and Factor V, CrCl-71.46ml/min; Dose is appropriate based on dosing criteria. Will send in refill to requested pharmacy.

## 2019-08-01 LAB — PSA: PSA: 4.84

## 2019-08-03 ENCOUNTER — Ambulatory Visit: Payer: Medicare Other

## 2019-08-08 ENCOUNTER — Telehealth: Payer: Self-pay | Admitting: *Deleted

## 2019-08-08 NOTE — Telephone Encounter (Signed)
   [facility] Pre-operative Risk Assessment    Request for surgical clearance:  1. What type of surgery is being performed? PROSTATE ULTRASOUND & BIOPSY   2. When is this surgery scheduled? TBD   3. What type of clearance is required (medical clearance vs. Pharmacy clearance to hold med vs. Both)? BOTH  4. Are there any medications that need to be held prior to surgery and how long? XARELTO X 3 DAYS PRIOR TO PROCEDURE   5. Practice name and name of physician performing surgery? [facility]; DOCTOR IS NOT LISTED. I TRIED TO CALL TO CONFIRM MD THOUGH WAS NOT ABLE TO GET THROUGH  6. What is your office phone number [phone]    7.   What is your office fax number [phone]  8.   Anesthesia type (None, local, MAC, general) ? NONE LISTED   Julaine Hua 08/08/2019, 1:08 PM  _________________________________________________________________   (provider comments below)

## 2019-08-09 NOTE — Telephone Encounter (Signed)
History of recurrent DVT and Factor V leiden deficiency on long term anticoagulation.   Do we given recommendation for holding xarelto in this situation?

## 2019-08-09 NOTE — Telephone Encounter (Signed)
Patient with diagnosis of recurrent DVT/factor V liden on Xarelto for anticoagulation.    Procedure:  PROSTATE ULTRASOUND & BIOPSY  Date of procedure: TBD  CrCl 64 ml/min  Patient is at high risk off anticoagulation due to history of multiple DVTs and factor V liden. MD is asking for a 3 day hold. I will defer to managing provider, Truitt Merle, NP.

## 2019-08-14 NOTE — Telephone Encounter (Signed)
S/w pt is aware of recommendations for upcoming surgery with Dr. Jeffie Pollock at [facility].  Sent via Epic to Urology.

## 2019-08-14 NOTE — Telephone Encounter (Signed)
Discussed with pharmacy this morning.   Our recommendation for holding Xarelto is as follows -   Hold for 2 days prior to procedure.  Restart within 24 hours of procedure.   He is at increased risk for DVT due to prior history of DVT and known Factor 5 Leiden disorder.   Burtis Junes, RN, [place] [address] [place], [place]  [id number] [phone]

## 2019-08-21 ENCOUNTER — Other Ambulatory Visit: Payer: Self-pay

## 2019-08-22 ENCOUNTER — Encounter: Payer: Self-pay | Admitting: Family Medicine

## 2019-08-22 ENCOUNTER — Ambulatory Visit (INDEPENDENT_AMBULATORY_CARE_PROVIDER_SITE_OTHER): Payer: Medicare Other | Admitting: Adult Health

## 2019-08-22 ENCOUNTER — Encounter: Payer: Self-pay | Admitting: Adult Health

## 2019-08-22 VITALS — BP 140/80 | Temp 97.7°F | Ht 72.75 in | Wt 213.0 lb

## 2019-08-22 DIAGNOSIS — D682 Hereditary deficiency of other clotting factors: Secondary | ICD-10-CM | POA: Diagnosis not present

## 2019-08-22 DIAGNOSIS — I10 Essential (primary) hypertension: Secondary | ICD-10-CM

## 2019-08-22 DIAGNOSIS — Z0000 Encounter for general adult medical examination without abnormal findings: Secondary | ICD-10-CM | POA: Diagnosis not present

## 2019-08-22 DIAGNOSIS — E7800 Pure hypercholesterolemia, unspecified: Secondary | ICD-10-CM

## 2019-08-22 DIAGNOSIS — N400 Enlarged prostate without lower urinary tract symptoms: Secondary | ICD-10-CM

## 2019-08-22 NOTE — Patient Instructions (Signed)
It was great seeing you today   Please schedule a lab visit for next week   Work on heart healthy diet and frequent exercise  Follow up in one year

## 2019-08-22 NOTE — Progress Notes (Signed)
Subjective:    Patient ID: Patrick Sosa, male    DOB: 1945/12/25, 74 y.o.   MRN: [id number]  HPI  Patient presents for yearly preventative medicine examination. He is a pleasant 74 year old male who  has a past medical history of Chronic anticoagulation, Diverticulosis, DVT (deep venous thrombosis) ([place]), Factor V deficiency ([place]), adenomatous colonic polyps (08/20/2018), Hypercholesterolemia, Hypertension, Kidney stones, Pneumonia, and Polycythemia.  Hypertension -is managed by cardiology.  Prescribed Norvasc, metoprolol, and spironolactone.  Does monitor his blood pressures at home and reports readings in the 120s over 80s.  He denies chest pain, shortness of breath, headaches, blurred vision, or syncopal episodes BP Readings from Last 3 Encounters:  08/22/19 140/80  04/03/19 140/78  10/03/18 130/81    Hyperlipidemia -currently prescribed statin.  He denies myalgia or fatigue Lab Results  Component Value Date   CHOL 126 01/31/2019   HDL 28 (L) 01/31/2019   LDLCALC 64 01/31/2019   LDLDIRECT 65.4 02/23/2012   TRIG 171 (H) 01/31/2019   CHOLHDL 4.5 01/31/2019    BPH -controlled with Uroxatral. He denies any issues. Is seen by Urology on a yearly basis. 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  He is on lifelong anticoagulation with Xarelto.  Managed by cardiology  All immunizations and health maintenance protocols were reviewed with the patient and needed orders were placed.   Appropriate screening laboratory values were ordered for the patient including screening of hyperlipidemia, renal function and hepatic function. If indicated by BPH, a PSA was ordered.  Medication reconciliation,  past medical history, social history, problem list and allergies were reviewed in detail with the patient  Goals were established with  regard to weight loss, exercise, and  diet in compliance with medications  Wt Readings from Last 3 Encounters:  08/22/19 213 lb (96.6 kg)  04/03/19 207 lb (93.9 kg)  10/03/18 202 lb (91.6 kg)   He is up-to-date on screening colonoscopy, he is on 3-year plan due to polyps.  He does participate in routine dental and vision screens  He has no acute complaints today     Review of Systems  Constitutional: Negative.   HENT: Negative.   Eyes: Negative.   Respiratory: Negative.   Cardiovascular: Negative.   Gastrointestinal: Negative.   Endocrine: Negative.   Genitourinary: Negative.   Musculoskeletal: Negative.   Skin: Negative.   Allergic/Immunologic: Negative.   Neurological: Negative.   Hematological: Negative.   Psychiatric/Behavioral: Negative.   All other systems reviewed and are negative.  Past Medical History:  Diagnosis Date  . Chronic anticoagulation   . Diverticulosis   . DVT (deep venous thrombosis) ([place])   . Factor V deficiency ([place])   . Hx of adenomatous colonic polyps 08/20/2018  . Hypercholesterolemia   . Hypertension   . Kidney stones   . Pneumonia   . Polycythemia     Social History   Socioeconomic History  . Marital status: Married    Spouse name: Not on file  . Number of children: 3  . Years of education: Not on file  . Highest education level: Not on file  Occupational History  . Occupation: retired  Tobacco Use  . Smoking status: Former Smoker    Quit date: 10/11/1981    Years since quitting: 37.8  . Smokeless tobacco:  Never Used  Substance and Sexual Activity  . Alcohol use: No    Alcohol/week: 0.0 standard drinks    Comment: rarely  . Drug use: No  . Sexual activity: Yes  Other Topics Concern  . 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 Difficulty of  Paying Living Expenses: Not on file  Food Insecurity:   . Worried About Charity fundraiser in the Last Year: Not on file  . Ran Out of Food in the Last Year: Not on file  Transportation Needs:   . Lack of Transportation (Medical): Not on file  . Lack of Transportation (Non-Medical): Not on file  Physical Activity:   . Days of Exercise per Week: Not on file  . Minutes of Exercise per Session: Not on file  Stress:   . Feeling of Stress : Not on file  Social Connections:   . Frequency of Communication with Friends and Family: Not on file  . Frequency of Social Gatherings with Friends and Family: Not on file  . Attends Religious Services: Not on file  . Active Member of Clubs or Organizations: Not on file  . Attends Archivist Meetings: Not on file  . Marital Status: Not on file  Intimate Partner Violence:   . Fear of Current or Ex-Partner: Not on file  . Emotionally Abused: Not on file  . Physically Abused: Not on file  . Sexually Abused: Not on file    Past Surgical History:  Procedure Laterality Date  . CATARACT EXTRACTION, BILATERAL Bilateral 2018  . COLONOSCOPY    . TONSILLECTOMY      Family History  Problem Relation Age of Onset  . Pancreatic cancer Father   . Hypertension Father   . Heart attack Father   . Alzheimer's disease Mother   . Arthritis Mother   . Hypertension Sister   . Breast cancer Sister   . Arthritis Sister   . Rheum arthritis Daughter     No Known Allergies  Current Outpatient Medications on File Prior to Visit  Medication Sig Dispense Refill  . alfuzosin (UROXATRAL) 10 MG 24 hr tablet Take 10 mg by mouth daily with breakfast.    . amLODipine (NORVASC) 5 MG tablet TAKE 1 TABLET(5 MG) BY MOUTH DAILY 90 tablet 3  . dimenhyDRINATE (DRAMAMINE) 50 MG tablet Take 25 mg by mouth at bedtime as needed. As needed for sleep.    . metoprolol tartrate (LOPRESSOR) 25 MG tablet TAKE 1 TABLET(25 MG) BY MOUTH TWICE DAILY 180 tablet 2  . pravastatin  (PRAVACHOL) 20 MG tablet TAKE 1 TABLET BY MOUTH DAILY 90 tablet 3  . spironolactone (ALDACTONE) 25 MG tablet TAKE 1/2 TABLET(12.5 MG) BY MOUTH DAILY 45 tablet 3  . XARELTO 20 MG TABS tablet TAKE 1 TABLET(20 MG) BY MOUTH DAILY WITH SUPPER 90 tablet 1   No current facility-administered medications on file prior to visit.    BP 140/80   Temp 97.7 F (36.5 C) (Temporal)   Ht 6' 0.75" (1.848 m)   Wt 213 lb (96.6 kg)   BMI 28.30 kg/m       Objective:   Physical Exam Vitals and nursing note reviewed.  Constitutional:  General: He is not in acute distress.    Appearance: Normal appearance. He is well-developed. He is not diaphoretic.  HENT:     Head: Normocephalic and atraumatic.     Right Ear: Tympanic membrane, ear canal and external ear normal. There is no impacted cerumen.     Left Ear: Tympanic membrane, ear canal and external ear normal. There is no impacted cerumen.     Nose: Nose normal. No congestion or rhinorrhea.     Mouth/Throat:     Mouth: Mucous membranes are moist.     Pharynx: Oropharynx is clear. No oropharyngeal exudate or posterior oropharyngeal erythema.  Eyes:     General: No scleral icterus.       Right eye: No discharge.        Left eye: No discharge.     Conjunctiva/sclera: Conjunctivae normal.     Pupils: Pupils are equal, round, and reactive to light.  Neck:     Thyroid: No thyromegaly.     Trachea: No tracheal deviation.  Cardiovascular:     Rate and Rhythm: Normal rate and regular rhythm.     Pulses: Normal pulses.     Heart sounds: Normal heart sounds. No murmur. No friction rub. No gallop.   Pulmonary:     Effort: Pulmonary effort is normal. No respiratory distress.     Breath sounds: Normal breath sounds. No stridor. No wheezing, rhonchi or rales.  Chest:     Chest wall: No tenderness.  Abdominal:     General: Bowel sounds are normal. There is no distension.     Palpations: Abdomen is soft. There is no mass.     Tenderness: There is no  abdominal tenderness. There is no right CVA tenderness, left CVA tenderness, guarding or rebound.     Hernia: No hernia is present.  Musculoskeletal:        General: No swelling, tenderness, deformity or signs of injury. Normal range of motion.     Right lower leg: No edema.     Left lower leg: No edema.  Lymphadenopathy:     Cervical: No cervical adenopathy.  Skin:    General: Skin is warm and dry.     Capillary Refill: Capillary refill takes less than 2 seconds.     Coloration: Skin is not jaundiced or pale.     Findings: No bruising, erythema, lesion or rash.  Neurological:     General: No focal deficit present.     Mental Status: He is alert and oriented to person, place, and time. Mental status is at baseline.     Cranial Nerves: No cranial nerve deficit.     Sensory: No sensory deficit.     Motor: No weakness.     Coordination: Coordination normal.     Gait: Gait normal.     Deep Tendon Reflexes: Reflexes normal.  Psychiatric:        Mood and Affect: Mood normal.        Behavior: Behavior normal.        Thought Content: Thought content normal.        Judgment: Judgment normal.       Assessment & Plan:  1. Routine general medical examination at a health care facility - Encouraged weight loss through diet and exercise - Follow up in one year or sooner if needed - CBC with Differential/Platelet; Future - Comprehensive metabolic panel; Future - Lipid panel; Future - TSH; Future  2. Essential hypertension - Better controlled at home.  -  No change in medications  - CBC with Differential/Platelet; Future - Comprehensive metabolic panel; Future - Lipid panel; Future - TSH; Future  3. Factor V deficiency ([place]) - Continue with xeralto  - CBC with Differential/Platelet; Future - Comprehensive metabolic panel; Future - Lipid panel; Future - TSH; Future  4. Benign prostatic hyperplasia without lower urinary tract symptoms - Follow up with urology as directed  5.  Hypercholesterolemia - Continue with statin  - CBC with Differential/Platelet; Future - Comprehensive metabolic panel; Future - Lipid panel; Future - TSH; Future   Dorothyann Peng, NP  This visit occurred during the SARS-CoV-2 public health emergency.  Safety protocols were in place, including screening questions prior to the visit, additional usage of staff PPE, and extensive cleaning of exam room while observing appropriate contact time as indicated for disinfecting solutions.

## 2019-08-29 ENCOUNTER — Other Ambulatory Visit: Payer: Self-pay

## 2019-08-30 ENCOUNTER — Other Ambulatory Visit (INDEPENDENT_AMBULATORY_CARE_PROVIDER_SITE_OTHER): Payer: Medicare Other

## 2019-08-30 DIAGNOSIS — D682 Hereditary deficiency of other clotting factors: Secondary | ICD-10-CM

## 2019-08-30 DIAGNOSIS — E7800 Pure hypercholesterolemia, unspecified: Secondary | ICD-10-CM | POA: Diagnosis not present

## 2019-08-30 DIAGNOSIS — Z0000 Encounter for general adult medical examination without abnormal findings: Secondary | ICD-10-CM

## 2019-08-30 DIAGNOSIS — I10 Essential (primary) hypertension: Secondary | ICD-10-CM

## 2019-08-30 LAB — LIPID PANEL
Cholesterol: 103 mg/dL (ref 0–200)
HDL: 25.2 mg/dL — ABNORMAL LOW (ref >39.00)
LDL Cholesterol: 54 mg/dL (ref 0–99)
NonHDL: 78.09
Total CHOL/HDL Ratio: 4
Triglycerides: 121 mg/dL (ref 0.0–149.0)
VLDL: 24.2 mg/dL (ref 0.0–40.0)

## 2019-08-30 LAB — COMPREHENSIVE METABOLIC PANEL
ALT: 20 U/L (ref 0–53)
AST: 27 U/L (ref 0–37)
Albumin: 4.1 g/dL (ref 3.5–5.2)
Alkaline Phosphatase: 53 U/L (ref 39–117)
BUN: 16 mg/dL (ref 6–23)
CO2: 29 mEq/L (ref 19–32)
Calcium: 9.1 mg/dL (ref 8.4–10.5)
Chloride: 103 mEq/L (ref 96–112)
Creatinine, Ser: 1.31 mg/dL (ref 0.40–1.50)
GFR: 53.9 mL/min — ABNORMAL LOW (ref >60.00)
Glucose, Bld: 86 mg/dL (ref 70–99)
Potassium: 3.7 mEq/L (ref 3.5–5.1)
Sodium: 138 mEq/L (ref 135–145)
Total Bilirubin: 0.9 mg/dL (ref 0.2–1.2)
Total Protein: 7.3 g/dL (ref 6.0–8.3)

## 2019-08-30 LAB — CBC WITH DIFFERENTIAL/PLATELET
Basophils Absolute: 0 10*3/uL (ref 0.0–0.1)
Basophils Relative: 0.5 % (ref 0.0–3.0)
Eosinophils Absolute: 0.3 10*3/uL (ref 0.0–0.7)
Eosinophils Relative: 5.3 % — ABNORMAL HIGH (ref 0.0–5.0)
HCT: 43.7 % (ref 39.0–52.0)
Hemoglobin: 15.6 g/dL (ref 13.0–17.0)
Lymphocytes Relative: 23.8 % (ref 12.0–46.0)
Lymphs Abs: 1.3 10*3/uL (ref 0.7–4.0)
MCHC: 35.7 g/dL (ref 30.0–36.0)
MCV: 101.5 fl — ABNORMAL HIGH (ref 78.0–100.0)
Monocytes Absolute: 0.6 10*3/uL (ref 0.1–1.0)
Monocytes Relative: 9.7 % (ref 3.0–12.0)
Neutro Abs: 3.4 10*3/uL (ref 1.4–7.7)
Neutrophils Relative %: 60.7 % (ref 43.0–77.0)
Platelets: 158 10*3/uL (ref 150.0–400.0)
RBC: 4.31 Mil/uL (ref 4.22–5.81)
RDW: 12.1 % (ref 11.5–15.5)
WBC: 5.6 10*3/uL (ref 4.0–10.5)

## 2019-08-30 LAB — TSH: TSH: 3.98 u[IU]/mL (ref 0.35–4.50)

## 2019-09-19 NOTE — Progress Notes (Signed)
CARDIOLOGY OFFICE NOTE  Date:  09/25/2019    Patrick Sosa Date of Birth: 1946/03/04 Medical Record [id number]  PCP:  Dorothyann Peng, NP  Cardiologist:  Servando Snare     Chief Complaint  Patient presents with  . Follow-up    History of Present Illness: Patrick Sosa is a 74 y.o. male who presents today for a 6 month check. Former patient of Dr. Sherryl Barters. Heprimarilyfollows with me.This is a 6 month check.  He has a history of essential hypertension, DVT, HLD, polycythemia and is on chronic Xarelto. He is not able to donate blood to the [facility] because he is on Xarelto. 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  Last seenby mein October - he was doing well. Lots of challenges with COVID and his church/grand kids. Felt to be doing ok. He was to have a prostate biopsy back in February.    The patient does not have symptoms concerning for COVID-19 infection (fever, chills, cough, or new shortness of breath).   Comes in today. Here alone. He is doing well. No chest pain. Not short of breath. Back walking some - little faster - his dog died and he has now noted he was not walking fast enough. Trying to work on his weight. Has had both vaccines. He has not had his prostate biopsy. It was cancelled - difficult to reschedule - now he is not wanting to proceed. He has opted to wait and have recheck of his PSA. No chest pain. Tolerating his medicines. Feels like he is doing well.   Past Medical History:  Diagnosis Date  . Chronic anticoagulation   . Diverticulosis   . DVT (deep venous thrombosis) ([place])   . Factor V deficiency ([place])   . Hx of adenomatous colonic polyps 08/20/2018  . Hypercholesterolemia   . Hypertension   . Kidney stones   . Pneumonia   . Polycythemia     Past Surgical History:  Procedure Laterality Date  . CATARACT EXTRACTION, BILATERAL Bilateral 2018  . COLONOSCOPY    . TONSILLECTOMY         Medications: Current Meds  Medication Sig  . alfuzosin (UROXATRAL) 10 MG 24 hr tablet Take 10 mg by mouth daily with breakfast.  . amLODipine (NORVASC) 5 MG tablet TAKE 1 TABLET(5 MG) BY MOUTH DAILY  . dimenhyDRINATE (DRAMAMINE) 50 MG tablet Take 25 mg by mouth at bedtime as needed. As needed for sleep.  . metoprolol tartrate (LOPRESSOR) 25 MG tablet TAKE 1 TABLET(25 MG) BY MOUTH TWICE DAILY  . pravastatin (PRAVACHOL) 20 MG tablet TAKE 1 TABLET BY MOUTH DAILY  . spironolactone (ALDACTONE) 25 MG tablet TAKE 1/2 TABLET(12.5 MG) BY MOUTH DAILY  . XARELTO 20 MG TABS tablet TAKE 1 TABLET(20 MG) BY MOUTH DAILY WITH SUPPER     Allergies: No Known Allergies  Social History: The patient  reports that he quit smoking about 37 years ago. He has never used smokeless tobacco. He reports that he does not drink alcohol or use drugs.   Family History: The patient's family history includes Alzheimer's disease in his mother; Arthritis in his mother and sister; Breast cancer in his sister; Heart attack in his father; Hypertension in his father and sister; Pancreatic cancer in his father; Rheum arthritis in his daughter.   Review of Systems: Please see the history of present illness.   All other systems are reviewed and negative.  Physical Exam: VS:  BP 122/80   Pulse 70   Ht 6' 0.5" (1.842 m)   Wt 210 lb (95.3 kg)   SpO2 98%   BMI 28.09 kg/m  .  BMI Body mass index is 28.09 kg/m.  Wt Readings from Last 3 Encounters:  09/25/19 210 lb (95.3 kg)  08/22/19 213 lb (96.6 kg)  04/03/19 207 lb (93.9 kg)    General: Pleasant. Well developed, well nourished and in no acute distress.   Cardiac: Regular rate and rhythm. No murmurs, rubs, or gallops. No edema.  Respiratory:  Lungs are clear to auscultation bilaterally with normal work of breathing.  GI: Soft and nontender.  MS: No deformity or atrophy. Gait and ROM intact.  Skin: Warm and dry. Color is normal.  Neuro:  Strength and sensation  are intact and no gross focal deficits noted.  Psych: Alert, appropriate and with normal affect.   LABORATORY DATA:  EKG:  EKG is not ordered today.    Lab Results  Component Value Date   WBC 5.6 08/30/2019   HGB 15.6 08/30/2019   HCT 43.7 08/30/2019   PLT 158.0 08/30/2019   GLUCOSE 86 08/30/2019   CHOL 103 08/30/2019   TRIG 121.0 08/30/2019   HDL 25.20 (L) 08/30/2019   LDLDIRECT 65.4 02/23/2012   LDLCALC 54 08/30/2019   ALT 20 08/30/2019   AST 27 08/30/2019   NA 138 08/30/2019   K 3.7 08/30/2019   CL 103 08/30/2019   CREATININE 1.31 08/30/2019   BUN 16 08/30/2019   CO2 29 08/30/2019   TSH 3.98 08/30/2019   PSA 4.84 08/01/2019   INR 1.1 ratio (H) 08/15/2009     BNP (last 3 results) No results for input(s): BNP in the last 8760 hours.  ProBNP (last 3 results) No results for input(s): PROBNP in the last 8760 hours.   Other Studies Reviewed Today:   Assessment/Plan:  1. HTN - BP is good. No changes made today. Needs to continue to work on CV risk factor modification. Will continue with his current regimen for now.   2. Factor V Leiden deficiency- on long term anticoagulation - no problems noted.   3. 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   4. Polycythemia - recent labs noted.   5. HLD - low HDL is chronic. He is on statin therapy - recent labs noted. Needs to work on CV risk factor modification.   6. Obesity - encouraged him to get back to walking more.   7. Elevated PSA - he did not proceed with the biopsy - he has concerns - he has seen Dr. Rosana Hoes in the past and would like to see him again - referral placed today.   8. COVID-19 Education: The signs and symptoms of COVID-19 were discussed with the patient and how to seek care for testing (follow up with PCP or arrange E-visit).  The importance of social distancing, staying at home, hand hygiene and wearing a mask when out in public were discussed today.  Current medicines are reviewed with the  patient today.  The patient does not have concerns regarding medicines other than what has been noted above.  The following changes have been made:  See above.  Labs/ tests ordered today include:    Orders Placed This Encounter  Procedures  . Ambulatory referral to Urology     Disposition:   FU with me in 6 months.   Patient is agreeable to this plan and will call if any  problems develop in the interim.   SignedTruitt Merle, NP  09/25/2019 9:11 AM  [facility] [address] [place], [place]  [address] Phone: [phone] Fax: [phone]

## 2019-09-25 ENCOUNTER — Other Ambulatory Visit: Payer: Self-pay

## 2019-09-25 ENCOUNTER — Ambulatory Visit: Payer: Medicare Other | Admitting: Nurse Practitioner

## 2019-09-25 ENCOUNTER — Encounter: Payer: Self-pay | Admitting: Nurse Practitioner

## 2019-09-25 ENCOUNTER — Ambulatory Visit (INDEPENDENT_AMBULATORY_CARE_PROVIDER_SITE_OTHER): Payer: Medicare Other | Admitting: Nurse Practitioner

## 2019-09-25 VITALS — BP 122/80 | HR 70 | Ht 72.5 in | Wt 210.0 lb

## 2019-09-25 DIAGNOSIS — D6851 Activated protein C resistance: Secondary | ICD-10-CM | POA: Diagnosis not present

## 2019-09-25 DIAGNOSIS — Z79899 Other long term (current) drug therapy: Secondary | ICD-10-CM

## 2019-09-25 DIAGNOSIS — Z7189 Other specified counseling: Secondary | ICD-10-CM

## 2019-09-25 DIAGNOSIS — E7800 Pure hypercholesterolemia, unspecified: Secondary | ICD-10-CM

## 2019-09-25 DIAGNOSIS — R972 Elevated prostate specific antigen [PSA]: Secondary | ICD-10-CM

## 2019-09-25 DIAGNOSIS — I119 Hypertensive heart disease without heart failure: Secondary | ICD-10-CM

## 2019-09-25 DIAGNOSIS — Z7901 Long term (current) use of anticoagulants: Secondary | ICD-10-CM

## 2019-09-25 NOTE — Patient Instructions (Addendum)
After Visit Summary:  We will be checking the following labs today - NONE   Medication Instructions:    Continue with your current medicines.    If you need a refill on your cardiac medications before your next appointment, please call your pharmacy.     Testing/Procedures To Be Arranged:  N/A  Follow-Up:   See me in 6 months  I have placed a referral to see Dr. Lawerance Bach    At [facility], you and your health needs are our priority.  As part of our continuing mission to provide you with exceptional heart care, we have created designated Provider Care Teams.  These Care Teams include your primary Cardiologist (physician) and Advanced Practice Providers (APPs -  Physician Assistants and Nurse Practitioners) who all work together to provide you with the care you need, when you need it.  Special Instructions:  . Stay safe, stay home, wash your hands for at least 20 seconds and wear a mask when out in public.  . It was good to talk with you today.    Call the [place] office at [phone] if you have any questions, problems or concerns.

## 2019-10-01 ENCOUNTER — Other Ambulatory Visit: Payer: Self-pay | Admitting: Nurse Practitioner

## 2019-10-01 ENCOUNTER — Telehealth: Payer: Self-pay | Admitting: Nurse Practitioner

## 2019-10-01 NOTE — Telephone Encounter (Signed)
S/w pt to let pt know Pt needs to call Dr.Davis @ [phone] to see if pt can get in with doctor.  Pt seen doctor before and this is [facility].

## 2019-10-01 NOTE — Telephone Encounter (Signed)
  [facility] called to say that the referral sent by Truitt Merle was sent to them by mistake. The doctor she has listed is not with their practice.

## 2019-10-01 NOTE — Telephone Encounter (Signed)
I did an external referral to Dr. Lawerance Bach.  Cecille Rubin

## 2019-10-01 NOTE — Progress Notes (Signed)
Error

## 2019-10-22 ENCOUNTER — Other Ambulatory Visit: Payer: Self-pay | Admitting: Nurse Practitioner

## 2020-01-20 ENCOUNTER — Other Ambulatory Visit: Payer: Self-pay | Admitting: Nurse Practitioner

## 2020-01-21 NOTE — Telephone Encounter (Signed)
Age 74, weight 95kg, SCr 1.31 on 08/30/19, CrCl 68mL/min, DVT/factor V leiden indication, last OV April 2021

## 2020-03-23 NOTE — Progress Notes (Signed)
CARDIOLOGY OFFICE NOTE  Date:  04/01/2020    Patrick Sosa Date of Birth: 06/08/1946 Medical Record #[id number]  PCP:  Dorothyann Peng, NP  Cardiologist:  Servando Snare   Chief Complaint  Patient presents with  . Follow-up    History of Present Illness: Patrick Sosa is a 74 y.o. male who presents today for a 6 month check. Former patient of Dr. Sherryl Barters. Heprimarilyfollows with me.  He has a history of essential hypertension, DVT, HLD, polycythemia and is on chronic Xarelto. He is not able to donate blood to the [facility] because he is on Xarelto. 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  Last seen in April - trying to work on his weight. He opted to not proceed with a prostate biopsy and wait for recheck of his PSA. Felt to be doing well from our standpoint.   Comes in today. Here alone. He is doing well. He has a wedding coming up this weekend - his daughter is getting re-married. He has been fully vaccinated. He feels good. No chest pain. Breathing is fine. He is back losing weight. No bleeding. Tolerating his medicines. Did not get his PSA rechecked - he says he plans to do this after the wedding festivities are completed.   Past Medical History:  Diagnosis Date  . Chronic anticoagulation   . Diverticulosis   . DVT (deep venous thrombosis) ([place])   . Factor V deficiency ([place])   . Hx of adenomatous colonic polyps 08/20/2018  . Hypercholesterolemia   . Hypertension   . Kidney stones   . Pneumonia   . Polycythemia     Past Surgical History:  Procedure Laterality Date  . CATARACT EXTRACTION, BILATERAL Bilateral 2018  . COLONOSCOPY    . TONSILLECTOMY       Medications: Current Meds  Medication Sig  . alfuzosin (UROXATRAL) 10 MG 24 hr tablet Take 10 mg by mouth daily with breakfast.  . amLODipine (NORVASC) 5 MG tablet TAKE 1 TABLET(5 MG) BY MOUTH DAILY  . dimenhyDRINATE (DRAMAMINE) 50 MG tablet  Take 25 mg by mouth at bedtime as needed. As needed for sleep.  . metoprolol tartrate (LOPRESSOR) 25 MG tablet TAKE 1 TABLET(25 MG) BY MOUTH TWICE DAILY  . pravastatin (PRAVACHOL) 20 MG tablet TAKE 1 TABLET BY MOUTH DAILY  . spironolactone (ALDACTONE) 25 MG tablet TAKE 1/2 TABLET(12.5 MG) BY MOUTH DAILY  . XARELTO 20 MG TABS tablet TAKE 1 TABLET(20 MG) BY MOUTH DAILY WITH SUPPER     Allergies: No Known Allergies  Social History: The patient  reports that he quit smoking about 38 years ago. He has never used smokeless tobacco. He reports that he does not drink alcohol and does not use drugs.   Family History: The patient's family history includes Alzheimer's disease in his mother; Arthritis in his mother and sister; Breast cancer in his sister; Heart attack in his father; Hypertension in his father and sister; Pancreatic cancer in his father; Rheum arthritis in his daughter.   Review of Systems: Please see the history of present illness.   All other systems are reviewed and negative.   Physical Exam: VS:  BP 108/70   Pulse (!) 58   Ht 6\' 1"  (1.854 m)   Wt 204 lb 9.6 oz (92.8 kg)   SpO2 98%   BMI 26.99 kg/m  .  BMI Body mass index is 26.99 kg/m.  Wt Readings from  Last 3 Encounters:  04/01/20 204 lb 9.6 oz (92.8 kg)  09/25/19 210 lb (95.3 kg)  08/22/19 213 lb (96.6 kg)    General: Pleasant. Alert and in no acute distress. His weight is going back down.    Cardiac: Regular rate and rhythm. No murmurs, rubs, or gallops. No edema.  Respiratory:  Lungs are clear to auscultation bilaterally with normal work of breathing.  GI: Soft and nontender.  MS: No deformity or atrophy. Gait and ROM intact.  Skin: Warm and dry. Color is normal.  Neuro:  Strength and sensation are intact and no gross focal deficits noted.  Psych: Alert, appropriate and with normal affect.   LABORATORY DATA:  EKG:  EKG is ordered today.  Personally reviewed by me. This demonstrates sinus bradycardia - HR is  58 - borderline 1st degree AV block.  Lab Results  Component Value Date   WBC 5.6 08/30/2019   HGB 15.6 08/30/2019   HCT 43.7 08/30/2019   PLT 158.0 08/30/2019   GLUCOSE 86 08/30/2019   CHOL 103 08/30/2019   TRIG 121.0 08/30/2019   HDL 25.20 (L) 08/30/2019   LDLDIRECT 65.4 02/23/2012   LDLCALC 54 08/30/2019   ALT 20 08/30/2019   AST 27 08/30/2019   NA 138 08/30/2019   K 3.7 08/30/2019   CL 103 08/30/2019   CREATININE 1.31 08/30/2019   BUN 16 08/30/2019   CO2 29 08/30/2019   TSH 3.98 08/30/2019   PSA 4.84 08/01/2019   INR 1.1 ratio (H) 08/15/2009     BNP (last 3 results) No results for input(s): BNP in the last 8760 hours.  ProBNP (last 3 results) No results for input(s): PROBNP in the last 8760 hours.   Other Studies Reviewed Today:   Assessment & Plan:  1. HTN - BP is great on his current regimen. No changes made today.   2. Obesity - he is back losing weight.   3. Factor V Leiden deficiency - on long term anticoagulation - no problems noted.   4. Chronic anticoagulation - no problems noted - lab today.   5. Remote DVT - on anticoagulation  6. Polycythemia - lab today.   7. HLD - with low HDL that is chronic - he remains on statin - lab today. He has gotten back on track more with CV risk factor modification.   8. Prior elevated PSA - he did not proceed with biopsy - he opted to have his PSA rechecked - he is going to do this after his daughter's wedding.   9. 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  I have left him on low dose beta blocker.   Current medicines are reviewed with the patient today.  The patient does not have concerns regarding medicines other than what has been noted above.  The following changes have been made:  See above.  Labs/ tests ordered today include:    Orders Placed This Encounter  Procedures  . Basic metabolic panel  . CBC  . Hepatic function panel  . Lipid panel  . EKG 12-Lead      Disposition:   FU with Dr. Johney Frame as new patient in March. He is aware that I am leaving the practice in February. Overall, he is doing well. No changes made today with his current regimen.    Patient is agreeable to this plan and will call if any problems develop in the interim.   SignedTruitt Merle, NP  04/01/2020 9:00 AM  [facility] [address] [place], [place]  [address] Phone: [phone] Fax: [phone]

## 2020-04-01 ENCOUNTER — Ambulatory Visit (INDEPENDENT_AMBULATORY_CARE_PROVIDER_SITE_OTHER): Payer: Medicare Other | Admitting: Nurse Practitioner

## 2020-04-01 ENCOUNTER — Other Ambulatory Visit: Payer: Self-pay

## 2020-04-01 ENCOUNTER — Encounter: Payer: Self-pay | Admitting: Nurse Practitioner

## 2020-04-01 VITALS — BP 108/70 | HR 58 | Ht 73.0 in | Wt 204.6 lb

## 2020-04-01 DIAGNOSIS — I119 Hypertensive heart disease without heart failure: Secondary | ICD-10-CM | POA: Diagnosis not present

## 2020-04-01 DIAGNOSIS — Z79899 Other long term (current) drug therapy: Secondary | ICD-10-CM | POA: Diagnosis not present

## 2020-04-01 DIAGNOSIS — Z7901 Long term (current) use of anticoagulants: Secondary | ICD-10-CM

## 2020-04-01 DIAGNOSIS — E7800 Pure hypercholesterolemia, unspecified: Secondary | ICD-10-CM

## 2020-04-01 DIAGNOSIS — D6851 Activated protein C resistance: Secondary | ICD-10-CM

## 2020-04-01 LAB — BASIC METABOLIC PANEL
BUN/Creatinine Ratio: 11 (ref 10–24)
BUN: 17 mg/dL (ref 8–27)
CO2: 26 mmol/L (ref 20–29)
Calcium: 9.4 mg/dL (ref 8.6–10.2)
Chloride: 101 mmol/L (ref 96–106)
Creatinine, Ser: 1.56 mg/dL — ABNORMAL HIGH (ref 0.76–1.27)
GFR calc Af Amer: 51 mL/min/{1.73_m2} — ABNORMAL LOW (ref >59)
GFR calc non Af Amer: 44 mL/min/{1.73_m2} — ABNORMAL LOW (ref >59)
Glucose: 89 mg/dL (ref 65–99)
Potassium: 4 mmol/L (ref 3.5–5.2)
Sodium: 137 mmol/L (ref 134–144)

## 2020-04-01 LAB — HEPATIC FUNCTION PANEL
ALT: 30 IU/L (ref 0–44)
AST: 35 IU/L (ref 0–40)
Albumin: 4.5 g/dL (ref 3.7–4.7)
Alkaline Phosphatase: 60 IU/L (ref 44–121)
Bilirubin Total: 0.8 mg/dL (ref 0.0–1.2)
Bilirubin, Direct: 0.21 mg/dL (ref 0.00–0.40)
Total Protein: 7.4 g/dL (ref 6.0–8.5)

## 2020-04-01 LAB — LIPID PANEL
Chol/HDL Ratio: 3.9 ratio (ref 0.0–5.0)
Cholesterol, Total: 114 mg/dL (ref 100–199)
HDL: 29 mg/dL — ABNORMAL LOW (ref >39)
LDL Chol Calc (NIH): 62 mg/dL (ref 0–99)
Triglycerides: 129 mg/dL (ref 0–149)
VLDL Cholesterol Cal: 23 mg/dL (ref 5–40)

## 2020-04-01 LAB — CBC
Hematocrit: 45.4 % (ref 37.5–51.0)
Hemoglobin: 16 g/dL (ref 13.0–17.7)
MCH: 35.6 pg — ABNORMAL HIGH (ref 26.6–33.0)
MCHC: 35.2 g/dL (ref 31.5–35.7)
MCV: 101 fL — ABNORMAL HIGH (ref 79–97)
Platelets: 150 10*3/uL (ref 150–450)
RBC: 4.5 x10E6/uL (ref 4.14–5.80)
RDW: 11.8 % (ref 11.6–15.4)
WBC: 5.7 10*3/uL (ref 3.4–10.8)

## 2020-04-01 NOTE — Patient Instructions (Addendum)
After Visit Summary:  We will be checking the following labs today - BMET, CBC, HPF, Lipids   Medication Instructions:    Continue with your current medicines.    If you need a refill on your cardiac medications before your next appointment, please call your pharmacy.     Testing/Procedures To Be Arranged:  N/A  Follow-Up:   See Dr. Johney Frame in March to establish    At [facility], you and your health needs are our priority.  As part of our continuing mission to provide you with exceptional heart care, we have created designated Provider Care Teams.  These Care Teams include your primary Cardiologist (physician) and Advanced Practice Providers (APPs -  Physician Assistants and Nurse Practitioners) who all work together to provide you with the care you need, when you need it.  Special Instructions:  . Stay safe, wash your hands for at least 20 seconds and wear a mask when needed.  . It was good to talk with you today.    Call the [place] office at [phone] if you have any questions, problems or concerns.

## 2020-04-02 ENCOUNTER — Other Ambulatory Visit: Payer: Self-pay | Admitting: *Deleted

## 2020-04-02 DIAGNOSIS — I119 Hypertensive heart disease without heart failure: Secondary | ICD-10-CM

## 2020-04-02 DIAGNOSIS — R7989 Other specified abnormal findings of blood chemistry: Secondary | ICD-10-CM

## 2020-04-02 NOTE — Progress Notes (Signed)
BMP to be done on November 12,2021

## 2020-04-08 ENCOUNTER — Ambulatory Visit (INDEPENDENT_AMBULATORY_CARE_PROVIDER_SITE_OTHER): Payer: Medicare Other

## 2020-04-08 DIAGNOSIS — Z0000 Encounter for general adult medical examination without abnormal findings: Secondary | ICD-10-CM

## 2020-04-08 NOTE — Patient Instructions (Signed)
Patrick Sosa , Thank you for taking time to come for your Medicare Wellness Visit. I appreciate your ongoing commitment to your health goals. Please review the following plan we discussed and let me know if I can assist you in the future.   Screening recommendations/referrals: Colonoscopy: Up to date, next due 08/16/2021 Recommended yearly ophthalmology/optometry visit for glaucoma screening and checkup Recommended yearly dental visit for hygiene and checkup  Vaccinations: Influenza vaccine: Up to date, next due fall 2022 Pneumococcal vaccine: Completed series Tdap vaccine: Up to date, next due 06/03/2020 Shingles vaccine: Currently due for Shingrix, please contact your local pharmacy to receive     Advanced directives: Advance directive discussed with you today. Even though you declined this today please call our office should you change your mind and we can give you the proper paperwork for you to fill out.   Conditions/risks identified: None   Next appointment: None   Preventive Care 65 Years and Older, Male Preventive care refers to lifestyle choices and visits with your health care provider that can promote health and wellness. What does preventive care include?  A yearly physical exam. This is also called an annual well check.  Dental exams once or twice a year.  Routine eye exams. Ask your health care provider how often you should have your eyes checked.  Personal lifestyle choices, including:  Daily care of your teeth and gums.  Regular physical activity.  Eating a healthy diet.  Avoiding tobacco and drug use.  Limiting alcohol use.  Practicing safe sex.  Taking low doses of aspirin every day.  Taking vitamin and mineral supplements as recommended by your health care provider. What happens during an annual well check? The services and screenings done by your health care provider during your annual well check will depend on your age, overall health, lifestyle  risk factors, and family history of disease. Counseling  Your health care provider may ask you questions about your:  Alcohol use.  Tobacco use.  Drug use.  Emotional well-being.  Home and relationship well-being.  Sexual activity.  Eating habits.  History of falls.  Memory and ability to understand (cognition).  Work and work Statistician. Screening  You may have the following tests or measurements:  Height, weight, and BMI.  Blood pressure.  Lipid and cholesterol levels. These may be checked every 5 years, or more frequently if you are over 64 years old.  Skin check.  Lung cancer screening. You may have this screening every year starting at age 84 if you have a 30-pack-year history of smoking and currently smoke or have quit within the past 15 years.  Fecal occult blood test (FOBT) of the stool. You may have this test every year starting at age 36.  Flexible sigmoidoscopy or colonoscopy. You may have a sigmoidoscopy every 5 years or a colonoscopy every 10 years starting at age 16.  Prostate cancer screening. Recommendations will vary depending on your family history and other risks.  Hepatitis C blood test.  Hepatitis B blood test.  Sexually transmitted disease (STD) testing.  Diabetes screening. This is done by checking your blood sugar (glucose) after you have not eaten for a while (fasting). You may have this done every 1-3 years.  Abdominal aortic aneurysm (AAA) screening. You may need this if you are a current or former smoker.  Osteoporosis. You may be screened starting at age 52 if you are at high risk. Talk with your health care provider about your test results, treatment options,  and if necessary, the need for more tests. Vaccines  Your health care provider may recommend certain vaccines, such as:  Influenza vaccine. This is recommended every year.  Tetanus, diphtheria, and acellular pertussis (Tdap, Td) vaccine. You may need a Td booster every 10  years.  Zoster vaccine. You may need this after age 14.  Pneumococcal 13-valent conjugate (PCV13) vaccine. One dose is recommended after age 1.  Pneumococcal polysaccharide (PPSV23) vaccine. One dose is recommended after age 46. Talk to your health care provider about which screenings and vaccines you need and how often you need them. This information is not intended to replace advice given to you by your health care provider. Make sure you discuss any questions you have with your health care provider. Document Released: 07/04/2015 Document Revised: 02/25/2016 Document Reviewed: 04/08/2015 Elsevier Interactive Patient Education  2017 [employer] Prevention in the Home Falls can cause injuries. They can happen to people of all ages. There are many things you can do to make your home safe and to help prevent falls. What can I do on the outside of my home?  Regularly fix the edges of walkways and driveways and fix any cracks.  Remove anything that might make you trip as you walk through a door, such as a raised step or threshold.  Trim any bushes or trees on the path to your home.  Use bright outdoor lighting.  Clear any walking paths of anything that might make someone trip, such as rocks or tools.  Regularly check to see if handrails are loose or broken. Make sure that both sides of any steps have handrails.  Any raised decks and porches should have guardrails on the edges.  Have any leaves, snow, or ice cleared regularly.  Use sand or salt on walking paths during winter.  Clean up any spills in your garage right away. This includes oil or grease spills. What can I do in the bathroom?  Use night lights.  Install grab bars by the toilet and in the tub and shower. Do not use towel bars as grab bars.  Use non-skid mats or decals in the tub or shower.  If you need to sit down in the shower, use a plastic, non-slip stool.  Keep the floor dry. Clean up any water that  spills on the floor as soon as it happens.  Remove soap buildup in the tub or shower regularly.  Attach bath mats securely with double-sided non-slip rug tape.  Do not have throw rugs and other things on the floor that can make you trip. What can I do in the bedroom?  Use night lights.  Make sure that you have a light by your bed that is easy to reach.  Do not use any sheets or blankets that are too big for your bed. They should not hang down onto the floor.  Have a firm chair that has side arms. You can use this for support while you get dressed.  Do not have throw rugs and other things on the floor that can make you trip. What can I do in the kitchen?  Clean up any spills right away.  Avoid walking on wet floors.  Keep items that you use a lot in easy-to-reach places.  If you need to reach something above you, use a strong step stool that has a grab bar.  Keep electrical cords out of the way.  Do not use floor polish or wax that makes floors slippery. If  you must use wax, use non-skid floor wax.  Do not have throw rugs and other things on the floor that can make you trip. What can I do with my stairs?  Do not leave any items on the stairs.  Make sure that there are handrails on both sides of the stairs and use them. Fix handrails that are broken or loose. Make sure that handrails are as long as the stairways.  Check any carpeting to make sure that it is firmly attached to the stairs. Fix any carpet that is loose or worn.  Avoid having throw rugs at the top or bottom of the stairs. If you do have throw rugs, attach them to the floor with carpet tape.  Make sure that you have a light switch at the top of the stairs and the bottom of the stairs. If you do not have them, ask someone to add them for you. What else can I do to help prevent falls?  Wear shoes that:  Do not have high heels.  Have rubber bottoms.  Are comfortable and fit you well.  Are closed at the  toe. Do not wear sandals.  If you use a stepladder:  Make sure that it is fully opened. Do not climb a closed stepladder.  Make sure that both sides of the stepladder are locked into place.  Ask someone to hold it for you, if possible.  Clearly mark and make sure that you can see:  Any grab bars or handrails.  First and last steps.  Where the edge of each step is.  Use tools that help you move around (mobility aids) if they are needed. These include:  Canes.  Walkers.  Scooters.  Crutches.  Turn on the lights when you go into a dark area. Replace any light bulbs as soon as they burn out.  Set up your furniture so you have a clear path. Avoid moving your furniture around.  If any of your floors are uneven, fix them.  If there are any pets around you, be aware of where they are.  Review your medicines with your doctor. Some medicines can make you feel dizzy. This can increase your chance of falling. Ask your doctor what other things that you can do to help prevent falls. This information is not intended to replace advice given to you by your health care provider. Make sure you discuss any questions you have with your health care provider. Document Released: 04/03/2009 Document Revised: 11/13/2015 Document Reviewed: 07/12/2014 Elsevier Interactive Patient Education  2017 [employer].

## 2020-04-08 NOTE — Progress Notes (Signed)
Subjective:   Patrick Sosa is a 74 y.o. male who presents for Medicare Annual/Subsequent preventive examination.  I connected with Patrick Sosa  today by telephone and verified that I am speaking with the correct person using two identifiers. Location patient: home Location provider: work Persons participating in the virtual visit: patient, provider.   I discussed the limitations, risks, security and privacy concerns of performing an evaluation and management service by telephone and the availability of in person appointments. I also discussed with the patient that there may be a patient responsible charge related to this service. The patient expressed understanding and verbally consented to this telephonic visit.    Interactive audio and video telecommunications were attempted between this provider and patient, however failed, due to patient having technical difficulties OR patient did not have access to video capability.  We continued and completed visit with audio only.      Review of Systems    N/A Cardiac Risk Factors include: advanced age (>98men, >4 women);hypertension;male gender;dyslipidemia     Objective:    Today's Vitals   There is no height or weight on file to calculate BMI.  Advanced Directives 04/08/2020  Does Patient Have a Medical Advance Directive? No  Would patient like information on creating a medical advance directive? No - Patient declined    Current Medications (verified) Outpatient Encounter Medications as of 04/08/2020  Medication Sig  . alfuzosin (UROXATRAL) 10 MG 24 hr tablet Take 10 mg by mouth daily with breakfast.  . amLODipine (NORVASC) 5 MG tablet TAKE 1 TABLET(5 MG) BY MOUTH DAILY  . dimenhyDRINATE (DRAMAMINE) 50 MG tablet Take 25 mg by mouth at bedtime as needed. As needed for sleep.  . metoprolol tartrate (LOPRESSOR) 25 MG tablet TAKE 1 TABLET(25 MG) BY MOUTH TWICE DAILY  . pravastatin (PRAVACHOL) 20 MG tablet TAKE 1 TABLET BY MOUTH DAILY   . spironolactone (ALDACTONE) 25 MG tablet TAKE 1/2 TABLET(12.5 MG) BY MOUTH DAILY  . XARELTO 20 MG TABS tablet TAKE 1 TABLET(20 MG) BY MOUTH DAILY WITH SUPPER   No facility-administered encounter medications on file as of 04/08/2020.    Allergies (verified) Patient has no known allergies.   History: Past Medical History:  Diagnosis Date  . Chronic anticoagulation   . Diverticulosis   . DVT (deep venous thrombosis) ([place])   . Factor V deficiency ([place])   . Hx of adenomatous colonic polyps 08/20/2018  . Hypercholesterolemia   . Hypertension   . Kidney stones   . Pneumonia   . Polycythemia    Past Surgical History:  Procedure Laterality Date  . CATARACT EXTRACTION, BILATERAL Bilateral 2018  . COLONOSCOPY    . TONSILLECTOMY     Family History  Problem Relation Age of Onset  . Pancreatic cancer Father   . Hypertension Father   . Heart attack Father   . Alzheimer's disease Mother   . Arthritis Mother   . Hypertension Sister   . Breast cancer Sister   . Arthritis Sister   . Rheum arthritis Daughter    Social History   Socioeconomic History  . Marital status: Married    Spouse name: Not on file  . Number of children: 3  . Years of education: Not on file  . Highest education level: Not on file  Occupational History  . Occupation: retired  Tobacco Use  . Smoking status: Former Smoker    Quit date: 10/11/1981    Years since quitting: 38.5  . Smokeless tobacco: Never Used  Vaping Use  . Vaping Use: Never used  Substance and Sexual Activity  . Alcohol use: No    Alcohol/week: 0.0 standard drinks    Comment: rarely  . Drug use: No  . Sexual activity: Yes  Other Topics Concern  . 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 Difficulty of Paying Living Expenses: Not hard at all    Food Insecurity: No Food Insecurity  . Worried About Charity fundraiser in the Last Year: Never true  . Ran Out of Food in the Last Year: Never true  Transportation Needs: No Transportation Needs  . Lack of Transportation (Medical): No  . Lack of Transportation (Non-Medical): No  Physical Activity: Insufficiently Active  . Days of Exercise per Week: 4 days  . Minutes of Exercise per Session: 20 min  Stress: No Stress Concern Present  . Feeling of Stress : Not at all  Social Connections: Moderately Integrated  . Frequency of Communication with Friends and Family: More than three times a week  . Frequency of Social Gatherings with Friends and Family: More than three times a week  . Attends Religious Services: More than 4 times per year  . Active Member of Clubs or Organizations: No  . Attends Archivist Meetings: Never  . Marital Status: Married    Tobacco Counseling Counseling given: Not Answered   Clinical Intake:  Pre-visit preparation completed: Yes  Pain : No/denies pain     Nutritional Risks: None Diabetes: No  How often do you need to have someone help you when you read instructions, pamphlets, or other written materials from your doctor or pharmacy?: 1 - Never What is the last grade level you completed in school?: College  Diabetic?No  Interpreter Needed?: No  Information entered by :: [place] of Daily Living In your present state of health, do you have any difficulty performing the following activities: 04/08/2020  Hearing? 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 N  Difficulty concentrating or making decisions? N  Walking or climbing stairs? N  Dressing or bathing? N  Doing errands, shopping? N  Preparing Food and eating ? N  Using the Toilet? N  In the past six months, have you accidently leaked urine? N  Do you have problems with loss of bowel control? N  Managing your Medications? N  Managing your Finances? N  Housekeeping or  managing your Housekeeping? N  Some recent data might be hidden    Patient Care Team: Dorothyann Peng, NP as PCP - General (Family Medicine) Irine Seal, MD as Attending Physician (Urology)  Indicate any recent Medical Services you may have received from other than [facility] providers in the past year (date may be approximate).     Assessment:   This is a routine wellness examination for [place].  Hearing/Vision screen  Hearing Screening   125Hz  250Hz  500Hz  1000Hz  2000Hz  3000Hz  4000Hz  6000Hz  8000Hz   Right ear:           Left ear:           Vision Screening Comments: Patient states gets eyes examined once per year    Dietary issues and exercise activities discussed: Current Exercise Habits:  Home exercise routine, Type of exercise: walking, Time (Minutes): 20, Frequency (Times/Week): 4, Weekly Exercise (Minutes/Week): 80, Intensity: Mild, Exercise limited by: None identified  Goals    . Exercise 3x per week (30 min per time)      Depression Screen PHQ 2/9 Scores 04/08/2020 08/22/2019 04/14/2016 04/01/2015 01/28/2014  PHQ - 2 Score 0 0 0 0 0  PHQ- 9 Score 0 - - - -    Fall Risk Fall Risk  04/08/2020 08/22/2019 04/14/2016 04/01/2015  Falls in the past year? 0 0 No No  Number falls in past yr: 0 - - -  Injury with Fall? 0 - - -  Risk for fall due to : Medication side effect - - -  Follow up Falls evaluation completed;Falls prevention discussed - - -    Any stairs in or around the home? Yes  If so, are there any without handrails? No  Home free of loose throw rugs in walkways, pet beds, electrical cords, etc? Yes  Adequate lighting in your home to reduce risk of falls? Yes   ASSISTIVE DEVICES UTILIZED TO PREVENT FALLS:  Life alert? No  Use of a cane, walker or w/c? No  Grab bars in the bathroom? No  Shower chair or bench in shower? No  Elevated toilet seat or a handicapped toilet? Yes     Cognitive Function:  Cognition intact based on direct observation.       Immunizations Immunization History  Administered Date(s) Administered  . Influenza Split 04/23/2011, 04/07/2012  . Influenza Whole 06/02/2009, 05/21/2010  . Influenza, High Dose Seasonal PF 04/01/2015, 04/14/2016, 04/19/2017, 04/18/2018, 03/06/2019  . Influenza-Unspecified 03/21/2014  . Moderna SARS-COVID-2 Vaccination 07/20/2019, 08/17/2019  . Pneumococcal Conjugate-13 04/14/2016  . Pneumococcal Polysaccharide-23 06/30/2013  . Td 06/03/2010  . Zoster 06/03/2010    TDAP status: Up to date Flu Vaccine status: Up to date Pneumococcal vaccine status: Up to date Covid-19 vaccine status: Completed vaccines  Qualifies for Shingles Vaccine? Yes   Zostavax completed Yes   Shingrix Completed?: No.    Education has been provided regarding the importance of this vaccine. Patient has been advised to call insurance company to determine out of pocket expense if they have not yet received this vaccine. Advised may also receive vaccine at local pharmacy or Health Dept. Verbalized acceptance and understanding.  Screening Tests Health Maintenance  Topic Date Due  . Hepatitis C Screening  Never done  . INFLUENZA VACCINE  01/20/2020  . TETANUS/TDAP  06/03/2020  . COLONOSCOPY  08/16/2021  . COVID-19 Vaccine  Completed  . PNA vac Low Risk Adult  Completed    Health Maintenance  Health Maintenance Due  Topic Date Due  . Hepatitis C Screening  Never done  . INFLUENZA VACCINE  01/20/2020    Colorectal cancer screening: Completed 08/16/2018. Repeat every 3 years  Lung Cancer Screening: (Low Dose CT Chest recommended if Age 61-80 years, 30 pack-year currently smoking OR have quit w/in 15years.) does not qualify.   Lung Cancer Screening Referral: N/A  Additional Screening:  Hepatitis C Screening: does qualify;   Vision Screening: Recommended annual ophthalmology exams for early detection of glaucoma and other disorders of the eye. Is the patient up to date with their annual eye exam?   Yes  Who is the provider or what is the name of the office in which the patient attends annual eye exams? [facility]  If pt is not established with a provider, would they like to be referred to a  provider to establish care? No .   Dental Screening: Recommended annual dental exams for proper oral hygiene  Community Resource Referral / Chronic Care Management: CRR required this visit?  No   CCM required this visit?  No      Plan:     I have personally reviewed and noted the following in the patient's chart:   . Medical and social history . Use of alcohol, tobacco or illicit drugs  . Current medications and supplements . Functional ability and status . Nutritional status . Physical activity . Advanced directives . List of other physicians . Hospitalizations, surgeries, and ER visits in previous 12 months . Vitals . Screenings to include cognitive, depression, and falls . Referrals and appointments  In addition, I have reviewed and discussed with patient certain preventive protocols, quality metrics, and best practice recommendations. A written personalized care plan for preventive services as well as general preventive health recommendations were provided to patient.     Ofilia Neas, LPN   [id number]   Nurse Notes: None

## 2020-04-24 ENCOUNTER — Other Ambulatory Visit: Payer: Self-pay | Admitting: Nurse Practitioner

## 2020-04-29 ENCOUNTER — Telehealth: Payer: Self-pay | Admitting: Cardiology

## 2020-04-29 NOTE — Telephone Encounter (Signed)
New message:     Patient calling stating that some one called. I did not see a note.

## 2020-04-29 NOTE — Telephone Encounter (Signed)
Spoke with pt and made him aware that I was unable to locate who may have called him.  Reminded pt of appt for labs on Friday.  Advised if something else was needed, hopefully someone will call him back.  Pt appreciative for call.

## 2020-05-02 ENCOUNTER — Other Ambulatory Visit: Payer: Medicare Other | Admitting: *Deleted

## 2020-05-02 ENCOUNTER — Other Ambulatory Visit: Payer: Self-pay

## 2020-05-02 DIAGNOSIS — R7989 Other specified abnormal findings of blood chemistry: Secondary | ICD-10-CM

## 2020-05-02 DIAGNOSIS — I119 Hypertensive heart disease without heart failure: Secondary | ICD-10-CM

## 2020-05-02 LAB — BASIC METABOLIC PANEL
BUN/Creatinine Ratio: 13 (ref 10–24)
BUN: 19 mg/dL (ref 8–27)
CO2: 26 mmol/L (ref 20–29)
Calcium: 9.2 mg/dL (ref 8.6–10.2)
Chloride: 98 mmol/L (ref 96–106)
Creatinine, Ser: 1.44 mg/dL — ABNORMAL HIGH (ref 0.76–1.27)
GFR calc Af Amer: 56 mL/min/{1.73_m2} — ABNORMAL LOW (ref >59)
GFR calc non Af Amer: 49 mL/min/{1.73_m2} — ABNORMAL LOW (ref >59)
Glucose: 88 mg/dL (ref 65–99)
Potassium: 3.9 mmol/L (ref 3.5–5.2)
Sodium: 133 mmol/L — ABNORMAL LOW (ref 134–144)

## 2020-05-29 LAB — PSA: PSA: 5.82

## 2020-06-05 ENCOUNTER — Telehealth: Payer: Self-pay | Admitting: *Deleted

## 2020-06-05 NOTE — Telephone Encounter (Signed)
   [facility] Pre-operative Risk Assessment    HEARTCARE STAFF: - Please ensure there is not already an duplicate clearance open for this procedure. - Under Visit Info/Reason for Call, type in Other and utilize the format Clearance MM/DD/YY or Clearance TBD. Do not use dashes or single digits. - If request is for dental extraction, please clarify the # of teeth to be extracted.  Request for surgical clearance:  1. What type of surgery is being performed? PROSTATE Bx    2. When is this surgery scheduled? TBD   3. What type of clearance is required (medical clearance vs. Pharmacy clearance to hold med vs. Both)? BOTH  4. Are there any medications that need to be held prior to surgery and how long? XARELTO x 3 DAYS PRIOR   5. Practice name and name of physician performing surgery? [facility]; DR. Jenny Reichmann [place]   6. What is the office phone number? [phone]   7.   What is the office fax number? [phone]  8.   Anesthesia type (None, local, MAC, general) ? LOCAL   Julaine Hua 06/05/2020, 3:52 PM  _________________________________________________________________   (provider comments below)

## 2020-06-06 NOTE — Telephone Encounter (Signed)
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    Procedure: prostate biopsy Date of procedure: TBD  CrCl 22mL/min Platelet count 150K  Per office protocol, patient can hold Xarelto for 2 days prior to procedure and resume within 24 hours after procedure, consistent with recommendation for prostate biopsy clearance from Feb 2021. He is at elevated risk off of anticoagulation.

## 2020-06-08 NOTE — Telephone Encounter (Signed)
   Primary Cardiologist: Patrick Sosa / Previously Dr. Mare Sosa  Chart reviewed as part of pre-operative protocol coverage. Patient was contacted 06/08/2020 in reference to pre-operative risk assessment for pending surgery as outlined below.  Patrick Sosa was last seen on 04/01/2020 by Patrick Sosa.  Since that day, Patrick Sosa has done well without chest pain or shortness of breath.  Therefore, based on ACC/AHA guidelines, the patient would be at acceptable risk for the planned procedure without further cardiovascular testing.   The patient was advised that if he develops new symptoms prior to surgery to contact our office to arrange for a follow-up visit, and he verbalized understanding.  I will route this recommendation to the requesting party via Epic fax function and remove from pre-op pool. Please call with questions.  See below for recommendation by our clinical pharmacist regarding Xarelto.  [place], [place] 06/08/2020, 12:10 PM

## 2020-07-18 ENCOUNTER — Other Ambulatory Visit: Payer: Self-pay | Admitting: Nurse Practitioner

## 2020-07-18 NOTE — Telephone Encounter (Signed)
Pt's age 75, wt 92.8 kg, SCr 1.44, CrCl 60.86, last ov w/ LG 04/01/20.

## 2020-07-25 ENCOUNTER — Encounter: Payer: Self-pay | Admitting: Family Medicine

## 2020-08-29 NOTE — Progress Notes (Signed)
Cardiology Office Note:    Date:  09/01/2020   ID:  [name], DOB Apr 13, 1946, MRN [id number]  PCP:  Dorothyann Peng, NP   [place]  Cardiologist:  No primary care provider on file.  Advanced Practice Provider:  No care team member to display Electrophysiologist:  None   Referring MD: Dorothyann Peng, NP    History of Present Illness:    Patrick Sosa is a 75 y.o. 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  Last saw her in 04/01/20 where he was doing well. No changes made to medications at that time.  Today, the patient feels well. Was diagnosed with prostate cancer and is planning to follow at [facility]. Planned for MRI in May (just had biopsy). Otherwise, he feels well. No chest pain, SOB, LE edema, orthopnea. No bleeding issues on xarelto. Very active and just got a new dog that keeps him very busy. Blood pressure elevated in the office but states that it is always high in the doctor's office and well controlled at home.  Past Medical History:  Diagnosis Date  . Chronic anticoagulation   . Diverticulosis   . DVT (deep venous thrombosis) ([place])   . Factor V deficiency ([place])   . Hx of adenomatous colonic polyps 08/20/2018  . Hypercholesterolemia   . Hypertension   . Kidney stones   . Pneumonia   . Polycythemia     Past Surgical History:  Procedure Laterality Date  . CATARACT EXTRACTION, BILATERAL Bilateral 2018  . COLONOSCOPY    . TONSILLECTOMY      Current Medications: Current Meds  Medication Sig  . alfuzosin (UROXATRAL) 10 MG 24 hr tablet Take 10 mg by mouth daily with breakfast.  . amLODipine (NORVASC) 5 MG tablet TAKE 1 TABLET(5 MG) BY MOUTH DAILY  . dimenhyDRINATE (DRAMAMINE) 50 MG tablet Take 25 mg by mouth at bedtime as needed. As needed for sleep.  . metoprolol tartrate (LOPRESSOR) 25 MG  tablet TAKE 1 TABLET(25 MG) BY MOUTH TWICE DAILY  . pravastatin (PRAVACHOL) 20 MG tablet TAKE 1 TABLET BY MOUTH DAILY  . spironolactone (ALDACTONE) 25 MG tablet TAKE 1/2 TABLET(12.5 MG) BY MOUTH DAILY  . XARELTO 20 MG TABS tablet TAKE 1 TABLET(20 MG) BY MOUTH DAILY WITH SUPPER     Allergies:   Patient has no known allergies.   Social History   Socioeconomic History  . Marital status: Married    Spouse name: Not on file  . Number of children: 3  . Years of education: Not on file  . Highest education level: Not on file  Occupational History  . Occupation: retired  Tobacco Use  . Smoking status: Former Smoker    Quit date: 10/11/1981    Years since quitting: 38.9  . Smokeless tobacco: Never Used  Vaping Use  . Vaping Use: Never used  Substance and Sexual Activity  . Alcohol use: No    Alcohol/week: 0.0 standard drinks    Comment: rarely  . Drug use: No  . Sexual activity: Yes  Other Topics Concern  . 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 Difficulty of Paying Living Expenses: Not hard at all  Food Insecurity: No Food Insecurity  . Worried About Charity fundraiser in the Last Year: Never true  . Ran Out of Food in the Last Year: Never true  Transportation Needs: No Transportation Needs  . Lack of Transportation (Medical): No  . Lack of Transportation (Non-Medical): No  Physical Activity: Insufficiently Active  . Days of Exercise per Week: 4 days  . Minutes of Exercise per Session: 20 min  Stress: No Stress Concern Present  . Feeling of Stress : Not at all  Social Connections: Moderately Integrated  . Frequency of Communication with Friends and Family: More than three times a week  . Frequency of Social Gatherings with Friends and Family: More than three times a week  . Attends  Religious Services: More than 4 times per year  . Active Member of Clubs or Organizations: No  . Attends Archivist Meetings: Never  . Marital Status: Married     Family History: The patient's family history includes Alzheimer's disease in his mother; Arthritis in his mother and sister; Breast cancer in his sister; Heart attack in his father; Hypertension in his father and sister; Pancreatic cancer in his father; Rheum arthritis in his daughter.  ROS:   Please see the history of present illness.    Review of Systems  Constitutional: Negative for chills and fever.  HENT: Negative for hearing loss.   Eyes: Negative for blurred vision and redness.  Respiratory: Negative for shortness of breath.   Cardiovascular: Negative for chest pain, palpitations, orthopnea, claudication, leg swelling and PND.  Gastrointestinal: Negative for blood in stool, melena, nausea and vomiting.  Genitourinary: Negative for flank pain and hematuria.  Musculoskeletal: Negative for falls and myalgias.  Neurological: Negative for dizziness and loss of consciousness.  Endo/Heme/Allergies: Negative for polydipsia.  Psychiatric/Behavioral: Negative for substance abuse.    EKGs/Labs/Other Studies Reviewed:    The following studies were reviewed today: 12/23/12: Summary:   - Findings consistent with deep vein thrombosis involving  the left posterial tibial vein, left peroneal vein, and  left gastrocnemius vein.  - Findings consistent with superficial vein thrombosis  involving the left greater saphenous vein and left small  saphenous vein.    EKG:  EKG is  ordered today.  The ekg ordered today demonstrates NSR with 72  Recent Labs: 04/01/2020: ALT 30; Hemoglobin 16.0; Platelets 150 05/02/2020: BUN 19; Creatinine, Ser 1.44; Potassium 3.9; Sodium 133  Recent Lipid Panel    Component Value Date/Time   CHOL 114 04/01/2020 0905   TRIG 129 04/01/2020 0905   HDL 29 (L) 04/01/2020 0905    CHOLHDL 3.9 04/01/2020 0905   CHOLHDL 4 08/30/2019 0713   VLDL 24.2 08/30/2019 0713   LDLCALC 62 04/01/2020 0905   LDLDIRECT 65.4 02/23/2012 1049     Risk Assessment/Calculations:       Physical Exam:    VS:  BP (!) 150/82   Pulse 72   Ht 6' (1.829 m)   Wt 207 lb (93.9 kg)   SpO2 96%   BMI 28.07 kg/m     Wt Readings from Last 3 Encounters:  09/01/20 207 lb (93.9 kg)  04/01/20 204 lb 9.6 oz (92.8 kg)  09/25/19 210 lb (95.3 kg)     GEN:  Well nourished, well developed in no acute distress HEENT: Normal NECK: No JVD; No carotid bruits CARDIAC: RRR,  no murmurs, rubs, gallops RESPIRATORY:  Clear to auscultation without rales, wheezing or rhonchi  ABDOMEN: Soft, non-tender, non-distended MUSCULOSKELETAL:  No edema; No deformity  SKIN: Warm and dry NEUROLOGIC:  Alert and oriented x 3 PSYCHIATRIC:  Normal affect   ASSESSMENT:    1. Factor V Leiden ([place])   2. Primary hypertension   3. Chronic anticoagulation   4. Hypercholesterolemia   5. Prostate cancer ([place])    PLAN:    In order of problems listed above:  #HTN: Elevated at home but well controlled at home.  -Continue amlodipine 5mg  daily -Continue spiro 25mg  daily -Continue metop 25mg  BID  #Factor V Leiden Deficiency with history of DVT: Tolerating AC. Will need bridge if xarelto held >48 hours prior to possible prostatectomy -Continue lifelong xarelto  #HLD: Well controlled with LDL 62 -Continue pravastatin 20mg  daily  #Prostate Cancer: Recently diagnosed on biopsy. Plans to follow-up at [facility]. -Follow-up at [facility] as scheduled -Will manage pre-op [facility] as needed with [facility] clinic   Medication Adjustments/Labs and Tests Ordered: Current medicines are reviewed at length with the patient today.  Concerns regarding medicines are outlined above.  Orders Placed This Encounter  Procedures  . EKG 12-Lead   No orders of the defined types were placed in this encounter.   Patient Instructions  Medication  Instructions:  Your physician recommends that you continue on your current medications as directed. Please refer to the Current Medication list given to you today.  *If you need a refill on your cardiac medications before your next appointment, please call your pharmacy*   Follow-Up: At [facility], you and your health needs are our priority.  As part of our continuing mission to provide you with exceptional heart care, we have created designated Provider Care Teams.  These Care Teams include your primary Cardiologist (physician) and Advanced Practice Providers (APPs -  Physician Assistants and Nurse Practitioners) who all work together to provide you with the care you need, when you need it.  We recommend signing up for the patient portal called "MyChart".  Sign up information is provided on this After Visit Summary.  MyChart is used to connect with patients for Virtual Visits (Telemedicine).  Patients are able to view lab/test results, encounter notes, upcoming appointments, etc.  Non-urgent messages can be sent to your provider as well.   To learn more about what you can do with MyChart, go to [email or web address].    Your next appointment:    12 months  The format for your next appointment:   In Person  Provider:   You may see  Dr. Gwyndolyn Kaufman or one of the following Advanced Practice Providers on your designated Care Team:    Richardson Dopp, PA-C  Robbie Lis, [place]  Other instruction: Please have your surgeon call/fax to [phone] in order to get you surgical clearance       Signed, Patrick Bergeron, MD  09/01/2020 9:06 AM    [place]

## 2020-09-01 ENCOUNTER — Ambulatory Visit (INDEPENDENT_AMBULATORY_CARE_PROVIDER_SITE_OTHER): Payer: Medicare Other | Admitting: Cardiology

## 2020-09-01 ENCOUNTER — Encounter: Payer: Self-pay | Admitting: Cardiology

## 2020-09-01 ENCOUNTER — Other Ambulatory Visit: Payer: Self-pay

## 2020-09-01 VITALS — BP 150/82 | HR 72 | Ht 72.0 in | Wt 207.0 lb

## 2020-09-01 DIAGNOSIS — Z7901 Long term (current) use of anticoagulants: Secondary | ICD-10-CM

## 2020-09-01 DIAGNOSIS — I10 Essential (primary) hypertension: Secondary | ICD-10-CM

## 2020-09-01 DIAGNOSIS — E7800 Pure hypercholesterolemia, unspecified: Secondary | ICD-10-CM

## 2020-09-01 DIAGNOSIS — D6851 Activated protein C resistance: Secondary | ICD-10-CM | POA: Diagnosis not present

## 2020-09-01 DIAGNOSIS — C61 Malignant neoplasm of prostate: Secondary | ICD-10-CM

## 2020-09-01 NOTE — Patient Instructions (Addendum)
Medication Instructions:  Your physician recommends that you continue on your current medications as directed. Please refer to the Current Medication list given to you today.  *If you need a refill on your cardiac medications before your next appointment, please call your pharmacy*   Follow-Up: At [facility] you and your health needs are our priority.  As part of our continuing mission to provide you with exceptional heart care, we have created designated Provider Care Teams.  These Care Teams include your primary Cardiologist (physician) and Advanced Practice Providers (APPs -  Physician Assistants and Nurse Practitioners) who all work together to provide you with the care you need, when you need it.  We recommend signing up for the patient portal called "MyChart".  Sign up information is provided on this After Visit Summary.  MyChart is used to connect with patients for Virtual Visits (Telemedicine).  Patients are able to view lab/test results, encounter notes, upcoming appointments, etc.  Non-urgent messages can be sent to your provider as well.   To learn more about what you can do with MyChart, go to [email or web address].    Your next appointment:    12 months  The format for your next appointment:   In Person  Provider:   You may see  Dr. Gwyndolyn Kaufman or one of the following Advanced Practice Providers on your designated Care Team:    Richardson Dopp, PA-C  Robbie Lis, [place]  Other instruction: Please have your surgeon call/fax to [phone] in order to get you surgical clearance

## 2020-09-12 ENCOUNTER — Encounter: Payer: Self-pay | Admitting: Podiatry

## 2020-09-12 ENCOUNTER — Ambulatory Visit (INDEPENDENT_AMBULATORY_CARE_PROVIDER_SITE_OTHER): Payer: Medicare Other | Admitting: Podiatry

## 2020-09-12 ENCOUNTER — Other Ambulatory Visit: Payer: Self-pay

## 2020-09-12 DIAGNOSIS — L600 Ingrowing nail: Secondary | ICD-10-CM

## 2020-09-12 DIAGNOSIS — B351 Tinea unguium: Secondary | ICD-10-CM

## 2020-09-12 NOTE — Patient Instructions (Signed)

## 2020-09-14 NOTE — Progress Notes (Signed)
Subjective:   Patient ID: Patrick Sosa, male   DOB: 75 y.o.   MRN: [id number]   HPI Patient presents stating he has been having a lot of problems with his nails and the second 1 right specifically has become increasingly hard for him to cut and it bleeds when he does.  Patient states this has been going on a long time does not smoke likes to be active   Review of Systems  All other systems reviewed and are negative.       Objective:  Physical Exam Vitals and nursing note reviewed.  Constitutional:      Appearance: He is well-developed.  Pulmonary:     Effort: Pulmonary effort is normal.  Musculoskeletal:        General: Normal range of motion.  Skin:    General: Skin is warm.  Neurological:     Mental Status: He is alert.     Neurovascular status intact muscle strength was found to be adequate range of motion adequate.  Patient second nail right is found to be deformed thickened and painful when pressed and other nails do have deformity but not to the same degree.  Patient is found to have good digital perfusion well oriented x3     Assessment:  Ingrown toenail deformity with thick damaged nail second right with other nails that have pathology not to the same degree     Plan:  H&P reviewed all conditions discussed at great length.  He is opted for permanent correction I explained procedure risk and he signed consent form after review.  I infiltrated the right second toe 60 mg like Marcaine mixture sterile prep done and using sterile instrumentation I removed the second nail exposed matrix applied phenol for applications 30 seconds followed by alcohol lavage sterile dressing gave instructions on soaks and to leave dressing on 24 hours but take it off earlier if needed and encouraged him to call with questions concerns

## 2020-10-16 ENCOUNTER — Other Ambulatory Visit: Payer: Self-pay

## 2020-10-16 MED ORDER — AMLODIPINE BESYLATE 5 MG PO TABS: ORAL_TABLET | ORAL | 3 refills | Status: DC

## 2020-12-10 ENCOUNTER — Telehealth: Payer: Self-pay | Admitting: *Deleted

## 2020-12-10 DIAGNOSIS — D6851 Activated protein C resistance: Secondary | ICD-10-CM

## 2020-12-10 NOTE — Telephone Encounter (Signed)
   [facility] Pre-operative Risk Assessment    Patient Name: Patrick Sosa  DOB: 1946/04/18  MRN: [id number]   HEARTCARE STAFF: - Please ensure there is not already an duplicate clearance open for this procedure. - Under Visit Info/Reason for Call, type in Other and utilize the format Clearance MM/DD/YY or Clearance TBD. Do not use dashes or single digits. - If request is for dental extraction, please clarify the # of teeth to be extracted. - If the patient is currently at the dentist's office, call Pre-Op APP to address. If the patient is not currently in the dentist office, please route to the Pre-Op pool  Request for surgical clearance:  What type of surgery is being performed? PROSTATE FUSION Bx    When is this surgery scheduled? 01/07/21   What type of clearance is required (medical clearance vs. Pharmacy clearance to hold med vs. Both)? MEDICAL  Are there any medications that need to be held prior to surgery and how long? Alveda Reasons; MD IS ASKING IF NECESSARY;CAN BE SURGERY/PROCEDURE WITH PT ON ANTIPLATELET THERAPY?    Practice name and name of physician performing surgery? [facility]; [facility]; DR. Justine Null   What is the office phone number? [phone]   7.   What is the office fax number? [phone] OR  [phone]  8.   Anesthesia type (None, local, MAC, general) ? NONE LISTED   Julaine Hua 12/10/2020, 3:04 PM  _________________________________________________________________   (provider comments below)

## 2020-12-11 NOTE — Telephone Encounter (Signed)
   Name: Patrick Sosa  DOB: April 06, 1946  MRN: [id number]   Primary Cardiologist: Freada Bergeron, MD  Chart reviewed as part of pre-operative protocol coverage. Patient was contacted 12/11/2020 in reference to pre-operative risk assessment for pending surgery as outlined below.  Patrick Sosa was last seen in March 2022 by Dr. Johney Frame.  Since that day, Patrick Sosa has done well. He is able to complete more than 4.0 METS without angina. He does not have a history of ischemic heart disease.   Per our clinical pharmacist: Per office protocol, patient can hold Xarelto for 3 days prior to procedure.  Patient WILL need lovenox bridge per Dr. Johney Frame. Our office will help coordinate this.  Therefore, based on ACC/AHA guidelines, the patient would be at acceptable risk for the planned procedure without further cardiovascular testing.   The patient was advised that if he develops new symptoms prior to surgery to contact our office to arrange for a follow-up visit, and he verbalized understanding.  I will route this recommendation to the requesting party via Epic fax function and remove from pre-op pool. Please call with questions.  [place], PA 12/11/2020, 4:22 PM

## 2020-12-11 NOTE — Telephone Encounter (Signed)
Dr. Jacolyn Reedy note indicated we will help manage bridging off of xarelto for biopsy. Can you please provide recommendations and help with this?

## 2020-12-11 NOTE — Telephone Encounter (Signed)
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    Procedure:  PROSTATE FUSION Bx Date of procedure: 01/07/21  Per office protocol, patient can hold Xarelto for 3 days prior to procedure.   Patient WILL need Lovenox bridge per Dr. Johney Frame.   Patient my chart message states his instructions were to hold Xarelto 7 days. This seems excessive and 3 days prior should suffice. Clearance does not mention length of hold. Either way, patient will need lovenox bridge.  He will also need update CBC and BMP. This will be completed the day before apt with pharmD. Called pt and scheduled apt for 7/12 @2 :00

## 2020-12-29 ENCOUNTER — Other Ambulatory Visit: Payer: Self-pay

## 2020-12-29 ENCOUNTER — Other Ambulatory Visit: Payer: Medicare Other

## 2020-12-29 DIAGNOSIS — D6851 Activated protein C resistance: Secondary | ICD-10-CM

## 2020-12-29 LAB — CBC
Hematocrit: 42.3 % (ref 37.5–51.0)
Hemoglobin: 15.1 g/dL (ref 13.0–17.7)
MCH: 36.4 pg — ABNORMAL HIGH (ref 26.6–33.0)
MCHC: 35.7 g/dL (ref 31.5–35.7)
MCV: 102 fL — ABNORMAL HIGH (ref 79–97)
Platelets: 143 10*3/uL — ABNORMAL LOW (ref 150–450)
RBC: 4.15 x10E6/uL (ref 4.14–5.80)
RDW: 12 % (ref 11.6–15.4)
WBC: 4.9 10*3/uL (ref 3.4–10.8)

## 2020-12-29 LAB — BASIC METABOLIC PANEL
BUN/Creatinine Ratio: 18 (ref 10–24)
BUN: 22 mg/dL (ref 8–27)
CO2: 22 mmol/L (ref 20–29)
Calcium: 9.2 mg/dL (ref 8.6–10.2)
Chloride: 103 mmol/L (ref 96–106)
Creatinine, Ser: 1.25 mg/dL (ref 0.76–1.27)
Glucose: 120 mg/dL — ABNORMAL HIGH (ref 65–99)
Potassium: 4 mmol/L (ref 3.5–5.2)
Sodium: 138 mmol/L (ref 134–144)
eGFR: 61 mL/min/{1.73_m2} (ref >59)

## 2020-12-30 ENCOUNTER — Ambulatory Visit (INDEPENDENT_AMBULATORY_CARE_PROVIDER_SITE_OTHER): Payer: Medicare Other | Admitting: Pharmacist

## 2020-12-30 VITALS — Wt 203.0 lb

## 2020-12-30 DIAGNOSIS — D682 Hereditary deficiency of other clotting factors: Secondary | ICD-10-CM

## 2020-12-30 MED ORDER — ENOXAPARIN SODIUM 150 MG/ML IJ SOSY: 150.0000 mg | PREFILLED_SYRINGE | INTRAMUSCULAR | 0 refills | Status: DC

## 2020-12-30 NOTE — Progress Notes (Signed)
Patient states he was told to hold Xarelto 7 days for upcoming prostate biopsy. We had cleared him for 3 days with Lovenox hold. I called Dr. Thomos Lemons office and left message on assistance VM to see if he can hold 3 days with bridge.     7/17 No Xarelto, Inject enoxaparin into the belly at 8 PM 7/18 No Xarelto, Inject enoxaparin into the belly at 8 PM 7/19 No Xarelto 7/20- Procedure day- No enoxaparin, no Xarelto  Resume Xarelto as soon as deemed safe by provider- preferably 24-48 hr post procedure  Rx called to [facility]. I called in for 6 syringes incase he has to hold 7 days. [facility] had to order but cost is $7.  I will send final instructions via mychart to patient once length of hold is finalized. He has to start holding tomorrow night if he is to hold 7 days. I will call again in the AM if I don't hear back.  Lovenox injection technique reviewed with patient Weight 92kg Crcl 48mlmin Plt 143  I will send finalized instructions via mychart

## 2020-12-31 ENCOUNTER — Telehealth: Payer: Self-pay | Admitting: Pharmacist

## 2020-12-31 NOTE — Telephone Encounter (Signed)
I spoke with Dr. Johney Frame who advised that patient only hold Xarelto 3 days prior to procedure. I have called pt and LVM for him to call back. I will send instructions via mychart.

## 2020-12-31 NOTE — Telephone Encounter (Signed)
I called and left another message at Dr. Janell Quiet office about length of hold for Xarelto

## 2020-12-31 NOTE — Telephone Encounter (Signed)
Spoke with pt. He did get a phone call from urology which said that his cardiologist recommended a 3 day hold. Pt with bridge instructions.

## 2021-01-14 ENCOUNTER — Telehealth: Payer: Self-pay | Admitting: *Deleted

## 2021-01-14 MED ORDER — RIVAROXABAN 20 MG PO TABS: 20.0000 mg | ORAL_TABLET | Freq: Every day | ORAL | 1 refills | Status: DC

## 2021-01-14 NOTE — Telephone Encounter (Signed)
Prescription refill request for Xarelto received.  Indication: DVT, factor V [place] Last office visit: 09/01/2020 Weight: 92.1 kg  Age: 75 yo  Scr: 1.25, 12/29/2020 CrCl: 69 ml/min

## 2021-01-14 NOTE — Telephone Encounter (Signed)
Spoke with Patrick Sosa, will keep pt on the same dose of Xarelto '20mg'$  daily.

## 2021-02-19 ENCOUNTER — Telehealth: Payer: Self-pay | Admitting: Adult Health

## 2021-02-19 NOTE — Telephone Encounter (Signed)
Spoke with patient  he is scheduled for surgery in a couple weeks wanted call back in [place] 2022

## 2021-02-24 ENCOUNTER — Telehealth: Payer: Self-pay | Admitting: Pharmacist

## 2021-02-24 NOTE — Telephone Encounter (Signed)
   Name: DORION SOMMERFELD  DOB: 03/14/1946  MRN: [id number]   Primary Cardiologist: Freada Bergeron, MD  Chart reviewed as part of pre-operative protocol coverage.   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     Procedure: CRYOSURGICAL ABLATION OF THE PROSTATE WITH URONAV (INCLUDES ULTRASONIC GUIDANCE AND MONITORING) Date of procedure: 03/04/21   CrCl 58.6 ml/min Platelet count 165   Per office protocol, patient can hold Xarelto for 3 days prior to procedure.     Patient WILL need Lovenox bridge per Dr. Johney Frame.    Patient previously bridge.    9/11: no xarelto, inject lovenox '150mg'$  into abdomen at 8PM 9/12: no xarelto, inject lovenox '150mg'$  into abdomen at 8PM 9/13: no xarelto, no lovenox 9/14: procedure day, no xarelto, no lovenox   Resume Xarelto as soon as deemed safe by provider- preferably 24-48 hr post procedure     I will route this recommendation to the requesting party via [facility] fax function and remove from pre-op pool. Please call with questions.  [place], PA 02/24/2021, 11:35 AM

## 2021-02-24 NOTE — Telephone Encounter (Addendum)
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    Procedure: CRYOSURGICAL ABLATION OF THE PROSTATE WITH URONAV (INCLUDES ULTRASONIC GUIDANCE AND MONITORING) Date of procedure: 03/04/21  CrCl 58.6 ml/min Platelet count 165  Per office protocol, patient can hold Xarelto for 3 days prior to procedure.    Patient WILL need Lovenox bridge per Dr. Johney Frame.   Patient previously bridge.   9/11: no xarelto, inject lovenox '150mg'$  into abdomen at 8PM 9/12: no xarelto, inject lovenox '150mg'$  into abdomen at 8PM 9/13: no xarelto, no lovenox 9/14: procedure day, no xarelto, no lovenox  Resume Xarelto as soon as deemed safe by provider- preferably 24-48 hr post procedure

## 2021-04-14 ENCOUNTER — Other Ambulatory Visit: Payer: Self-pay

## 2021-04-14 MED ORDER — SPIRONOLACTONE 25 MG PO TABS: ORAL_TABLET | ORAL | 1 refills | Status: DC

## 2021-06-17 ENCOUNTER — Telehealth: Payer: Self-pay | Admitting: Cardiology

## 2021-06-17 NOTE — Telephone Encounter (Signed)
Pt was simply calling our scheduling dept to schedule an appt with Dr. Johney Frame at next available, to discuss BP management/HTN. Pt states his pressures have been running on the higher side for him, with several recordings provided in this message.   He states he is completely asymptomatic from a cardiac perspective. Pt reports he deals with a lot of stressors, like prostate cancer and neuropathy pain, which he attributes could be causing his pressures to be elevated for quite sometime now.  Pt states he is going to be mindful and start working on his diet as well as reducing the amount of salt he consumes. Pt states he closely monitors his pressures at home.   Pt would prefer seeing Dr. Johney Frame in the clinic before meds are adjusted.  Dr. Johney Frame has an availability on next Thursday 06/25/21 at 0800. Scheduled the pt there.  Advised him to arrive 15 mins prior to this appt.   Pt will continue monitoring his pressures and recording these.  He will bring those with him to his OV next week.   Pt aware to lower the amount of salt in his diet, stay plenty hydrated with water.  Informed the pt if he develops any symptoms between now and his appt next week, to notify us of this.  He is aware to continue his current med regimen.   Informed the pt that I will route this message to Dr. Johney Frame as a general FYI, to make her aware of this plan. Pt verbalized understanding and agrees with this plan.  Pt was more than gracious for all the assistance provided.

## 2021-06-17 NOTE — Telephone Encounter (Signed)
Per MyChart scheduling message:  Pt c/o BP issue: STAT if pt c/o blurred vision, one-sided weakness or slurred speech  1. What are your last 5 BP readings?   BP on : 12/17. 166/73 12/22.  172/93 12/23.  170/89 12/24.  141/82 12/28.  146/89  2. Are you having any other symptoms (ex. Dizziness, headache, blurred vision, passed out)?   No other symptoms.    3. What is your BP issue?     Uncertain if neuropathy pain at night when I lay down is related.  Have history of HBP, but been under control for many years.

## 2021-06-23 NOTE — Progress Notes (Deleted)
Cardiology Office Note:    Date:  06/23/2021   ID:  Patrick Sosa, DOB 05/12/1946, MRN [id number]  PCP:  Dorothyann Peng, NP   [place]  Cardiologist:  Freada Bergeron, MD  Advanced Practice Provider:  No care team member to display Electrophysiologist:  None   Referring MD: Dorothyann Peng, NP    History of Present Illness:    Patrick Sosa is a 76 y.o. 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  Last seen in clinic on 09/01/20 . Was diagnosed with prostate cancer and was planning to follow at [facility]. Otherwise had no CV complaints. Has white coat HTN.  Today, ***  Past Medical History:  Diagnosis Date   Chronic anticoagulation    Diverticulosis    DVT (deep venous thrombosis) (HCC)    Factor V deficiency (HCC)    Hx of adenomatous colonic polyps 08/20/2018   Hypercholesterolemia    Hypertension    Kidney stones    Pneumonia    Polycythemia     Past Surgical History:  Procedure Laterality Date   CATARACT EXTRACTION, BILATERAL Bilateral 2018   COLONOSCOPY     TONSILLECTOMY      Current Medications: No outpatient medications have been marked as taking for the 06/25/21 encounter (Appointment) with Freada Bergeron, MD.     Allergies:   Patient has no known allergies.   Social History   Socioeconomic History   Marital status: Married    Spouse name: Not on file   Number of children: 3   Years of education: Not on file   Highest education level: Not on file  Occupational History   Occupation: retired  Tobacco Use   Smoking status: Former    Types: Cigarettes    Quit date: 10/11/1981    Years since quitting: 39.7   Smokeless tobacco: Never  Vaping Use   Vaping Use: Never used  Substance and Sexual Activity   Alcohol use: No    Alcohol/week: 0.0 standard drinks    Comment: rarely    Drug use: No   Sexual activity: Yes  Other Topics Concern   Not on file  Social History Narrative   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, maintenance (IT)    Married    Three children    Six grandchildren       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  ROS:   Please see the history of present illness.    Review of Systems  Constitutional:  Negative for chills and fever.  HENT:  Negative for hearing loss.   Eyes:  Negative for blurred vision and redness.  Respiratory:  Negative for shortness of breath.   Cardiovascular:  Negative for chest pain, palpitations, orthopnea, claudication, leg swelling and PND.  Gastrointestinal:  Negative for  blood in stool, melena, nausea and vomiting.  Genitourinary:  Negative for flank pain and hematuria.  Musculoskeletal:  Negative for falls and myalgias.  Neurological:  Negative for dizziness and loss of consciousness.  Endo/Heme/Allergies:  Negative for polydipsia.  Psychiatric/Behavioral:  Negative for substance abuse.    EKGs/Labs/Other Studies Reviewed:    The following studies were reviewed today: 12/23/12: Summary:   - Findings consistent with deep vein thrombosis involving    the left posterial tibial vein, left peroneal vein, and    left gastrocnemius vein.  - Findings consistent with superficial vein thrombosis    involving the left greater saphenous vein and left small    saphenous vein.    EKG:  EKG is  ordered today.  The ekg ordered today demonstrates  NSR with 72  Recent Labs: 12/29/2020: BUN 22; Creatinine, Ser 1.25; Hemoglobin 15.1; Platelets 143; Potassium 4.0; Sodium 138  Recent Lipid Panel    Component Value Date/Time   CHOL 114 04/01/2020 0905   TRIG 129 04/01/2020 0905   HDL 29 (L) 04/01/2020 0905   CHOLHDL 3.9 04/01/2020 0905   CHOLHDL 4 08/30/2019 0713   VLDL 24.2 08/30/2019 0713   LDLCALC 62 04/01/2020 0905   LDLDIRECT 65.4 02/23/2012 1049     Risk Assessment/Calculations:       Physical Exam:    VS:  There were no vitals taken for this visit.    Wt Readings from Last 3 Encounters:  12/30/20 203 lb (92.1 kg)  09/01/20 207 lb (93.9 kg)  04/01/20 204 lb 9.6 oz (92.8 kg)     GEN:  Well nourished, well developed in no acute distress HEENT: Normal NECK: No JVD; No carotid bruits CARDIAC: RRR, no murmurs, rubs, gallops RESPIRATORY:  Clear to auscultation without rales, wheezing or rhonchi  ABDOMEN: Soft, non-tender, non-distended MUSCULOSKELETAL:  No edema; No deformity  SKIN: Warm and dry NEUROLOGIC:  Alert and oriented x 3 PSYCHIATRIC:  Normal affect   ASSESSMENT:    No diagnosis found.  PLAN:    In order of problems listed above:  #HTN: Elevated at home but well controlled at home.  -Continue amlodipine 5mg  daily -Continue spiro 25mg  daily -Continue metop 25mg  BID  #Factor V Leiden Deficiency with history of DVT: Tolerating AC.  -Continue lifelong xarelto  #HLD: Well controlled with LDL 62 -Continue pravastatin 20mg  daily  #Prostate Cancer: Recently diagnosed on biopsy. Follows at [facility]. -Follow[facility]up at [facility] as scheduled    Medication Adjustments/Labs and Tests Ordered: Current medicines are reviewed at length with the patient today.  Concerns regarding medicines are outlined above.  No orders of the defined types were placed in this encounter.  No orders of the defined types were placed in this encounter.   There are no Patient Instructions on file for this visit.     Signed, Freada Bergeron, MD  06/23/2021 8:18 PM    [place]

## 2021-06-25 ENCOUNTER — Ambulatory Visit: Payer: Medicare Other | Admitting: Cardiology

## 2021-07-13 ENCOUNTER — Other Ambulatory Visit: Payer: Self-pay | Admitting: *Deleted

## 2021-07-13 ENCOUNTER — Other Ambulatory Visit: Payer: Self-pay

## 2021-07-13 DIAGNOSIS — D682 Hereditary deficiency of other clotting factors: Secondary | ICD-10-CM

## 2021-07-13 DIAGNOSIS — Z7901 Long term (current) use of anticoagulants: Secondary | ICD-10-CM

## 2021-07-13 MED ORDER — PRAVASTATIN SODIUM 20 MG PO TABS: 20.0000 mg | ORAL_TABLET | Freq: Every day | ORAL | 0 refills | Status: DC

## 2021-07-13 MED ORDER — RIVAROXABAN 20 MG PO TABS
20.0000 mg | ORAL_TABLET | Freq: Every day | ORAL | 1 refills | Status: DC
Start: 2021-07-13 — End: 2022-01-11

## 2021-07-13 MED ORDER — METOPROLOL TARTRATE 25 MG PO TABS: ORAL_TABLET | ORAL | 0 refills | Status: DC

## 2021-07-13 NOTE — Telephone Encounter (Addendum)
Xarelto 20mg  paper refill request received. Pt is 76 years old, weight-92.1kg, Crea-1.25 on 12/29/2020, last seen by Dr. Johney Frame on 09/01/2020, Diagnosis-Factor V Leiden and DVT hx, CrCl-68.89ml/min; Dose is appropriate based on dosing criteria.   Will ask pharmacist about the [facility].  Spoke with Lenna Sciara, Pharmacist to ensure pt can continue Xarelto 20mg  daily and she confirmed that the pt could do so. She states that since pt has Factor V Leiden she remains on full dose of Xarelto per lifelong.

## 2021-09-04 NOTE — Progress Notes (Deleted)
?Cardiology Office Note:   ? ?Date:  09/04/2021  ? ?ID:  Patrick Sosa, DOB 04/18/1946, MRN [id number] ? ?PCP:  Dorothyann Peng, NP ?  ?[place]  ?Cardiologist:  Freada Bergeron, MD  ?Advanced Practice Provider:  No care team member to display ?Electrophysiologist:  None  ? ?Referring MD: Dorothyann Peng, NP  ? ? ?History of Present Illness:   ? ?Patrick Sosa is a 76 y.o. 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 ? ?Last seen in 08/2020 after he was just diagnosed with prostate cancer. Was doing well from a CV standpoint.  ? ?Today, *** ? ?Past Medical History:  ?Diagnosis Date  ? Chronic anticoagulation   ? Diverticulosis   ? DVT (deep venous thrombosis) ([place])   ? Factor V deficiency ([name])   ? Hx of adenomatous colonic polyps 08/20/2018  ? Hypercholesterolemia   ? Hypertension   ? Kidney stones   ? Pneumonia   ? Polycythemia   ? ? ?Past Surgical History:  ?Procedure Laterality Date  ? CATARACT EXTRACTION, BILATERAL Bilateral 2018  ? COLONOSCOPY    ? TONSILLECTOMY    ? ? ?Current Medications: ?No outpatient medications have been marked as taking for the 09/07/21 encounter (Appointment) with Freada Bergeron, MD.  ?  ? ?Allergies:   Patient has no known allergies.  ? ?Social History  ? ?Socioeconomic History  ? Marital status: Married  ?  Spouse name: Not on file  ? Number of children: 3  ? Years of education: Not on file  ? Highest education level: Not on file  ?Occupational History  ? Occupation: retired  ?Tobacco Use  ? Smoking status: Former  ?  Types: Cigarettes  ?  Quit date: 10/11/1981  ?  Years since quitting: 39.9  ? Smokeless tobacco: Never  ?Vaping Use  ? Vaping Use: Never used  ?Substance and Sexual Activity  ? Alcohol use: No  ?  Alcohol/week: 0.0 standard drinks  ?  Comment: rarely  ? Drug use: No  ? Sexual activity: Yes  ?Other  Topics Concern  ? Not on file  ?Social History Narrative  ? 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 Married   ? Three children   ? Six grandchildren   ?   ? He is active at church    ? ?Social Determinants of Health  ? ?Financial Resource Strain: Not on file  ?Food Insecurity: Not on file  ?Transportation Needs: Not on file  ?Physical Activity: Not on file  ?Stress: Not on file  ?Social Connections: Not on file  ?  ? ?Family History: ?The patient's family history includes Alzheimer's disease in his mother; Arthritis in his mother and sister; Breast cancer in his sister; Heart attack in his father; Hypertension in his father and sister; Pancreatic cancer in his father; Rheum arthritis in his daughter. ? ?ROS:   ?Please see the history of present illness.    ?Review of Systems  ?Constitutional:  Negative for chills and fever.  ?HENT:  Negative for hearing loss.   ?Eyes:  Negative for blurred vision and redness.  ?Respiratory:  Negative for shortness of breath.   ?Cardiovascular:  Negative for chest pain, palpitations, orthopnea, claudication, leg swelling and PND.  ?Gastrointestinal:  Negative for blood in stool, melena, nausea and vomiting.  ?  Genitourinary:  Negative for flank pain and hematuria.  ?Musculoskeletal:  Negative for falls and myalgias.  ?Neurological:  Negative for dizziness and loss of consciousness.  ?Endo/Heme/Allergies:  Negative for polydipsia.  ?Psychiatric/Behavioral:  Negative for substance abuse.   ? ?EKGs/Labs/Other Studies Reviewed:   ? ?The following studies were reviewed today: ?12/23/12: ?Summary:  ? ?- Findings consistent with deep vein thrombosis involving  ?  the left posterial tibial vein, left peroneal vein, and  ?  left gastrocnemius vein.  ?- Findings consistent with superficial vein thrombosis  ?  involving the left greater saphenous vein and left small  ?  saphenous vein.  ? ? ?EKG:  EKG is  ordered today.  The ekg ordered today demonstrates NSR with 72 ? ?Recent Labs: ?12/29/2020: BUN  22; Creatinine, Ser 1.25; Hemoglobin 15.1; Platelets 143; Potassium 4.0; Sodium 138  ?Recent Lipid Panel ?   ?Component Value Date/Time  ? CHOL 114 04/01/2020 0905  ? TRIG 129 04/01/2020 0905  ? HDL 29 (L) 04/01/2020 0905  ? CHOLHDL 3.9 04/01/2020 0905  ? CHOLHDL 4 08/30/2019 0713  ? VLDL 24.2 08/30/2019 0713  ? [place] 62 04/01/2020 0905  ? LDLDIRECT 65.4 02/23/2012 1049  ? ? ? ?Risk Assessment/Calculations:   ?  ? ? ?Physical Exam:   ? ?VS:  There were no vitals taken for this visit.   ? ?Wt Readings from Last 3 Encounters:  ?12/30/20 203 lb (92.1 kg)  ?09/01/20 207 lb (93.9 kg)  ?04/01/20 204 lb 9.6 oz (92.8 kg)  ?  ? ?GEN:  Well nourished, well developed in no acute distress ?HEENT: Normal ?NECK: No JVD; No carotid bruits ?CARDIAC: RRR, no murmurs, rubs, gallops ?RESPIRATORY:  Clear to auscultation without rales, wheezing or rhonchi  ?ABDOMEN: Soft, non-tender, non-distended ?MUSCULOSKELETAL:  No edema; No deformity  ?SKIN: Warm and dry ?NEUROLOGIC:  Alert and oriented x 3 ?PSYCHIATRIC:  Normal affect  ? ?ASSESSMENT:   ? ?No diagnosis found. ? ?PLAN:   ? ?In order of problems listed above: ? ?#HTN: ?Elevated at home but well controlled at home.  ?-Continue amlodipine '5mg'$  daily ?-Continue spiro '25mg'$  daily ?-Continue metop '25mg'$  BID ? ?#Factor V Leiden Deficiency with history of DVT: ?Tolerating AC. Will need bridge if xarelto held >48 hours prior to possible prostatectomy ?-Continue lifelong xarelto ? ?#HLD: ?Well controlled with LDL 62 ?-Continue pravastatin '20mg'$  daily ? ?#Prostate Cancer: ?Recently diagnosed on biopsy. Plans to follow-up at [facility]. ?-Follow-up at [facility] as scheduled ?-Will manage pre-op [facility] as needed with [facility] clinic ? ? ?Medication Adjustments/Labs and Tests Ordered: ?Current medicines are reviewed at length with the patient today.  Concerns regarding medicines are outlined above.  ?No orders of the defined types were placed in this encounter. ? ?No orders of the defined types were placed in this  encounter. ? ? ?There are no Patient Instructions on file for this visit. ?  ? ?Signed, ?Freada Bergeron, MD  ?09/04/2021 11:12 AM    ?[place] ?

## 2021-09-07 ENCOUNTER — Encounter: Payer: Self-pay | Admitting: Cardiology

## 2021-09-07 ENCOUNTER — Ambulatory Visit (INDEPENDENT_AMBULATORY_CARE_PROVIDER_SITE_OTHER): Payer: Medicare Other | Admitting: Cardiology

## 2021-09-07 ENCOUNTER — Other Ambulatory Visit: Payer: Self-pay

## 2021-09-07 VITALS — BP 140/78 | HR 62 | Ht 73.0 in | Wt 201.0 lb

## 2021-09-07 DIAGNOSIS — I10 Essential (primary) hypertension: Secondary | ICD-10-CM

## 2021-09-07 DIAGNOSIS — I119 Hypertensive heart disease without heart failure: Secondary | ICD-10-CM

## 2021-09-07 DIAGNOSIS — D682 Hereditary deficiency of other clotting factors: Secondary | ICD-10-CM

## 2021-09-07 DIAGNOSIS — C61 Malignant neoplasm of prostate: Secondary | ICD-10-CM

## 2021-09-07 DIAGNOSIS — E7800 Pure hypercholesterolemia, unspecified: Secondary | ICD-10-CM | POA: Diagnosis not present

## 2021-09-07 DIAGNOSIS — Z7901 Long term (current) use of anticoagulants: Secondary | ICD-10-CM

## 2021-09-07 LAB — BASIC METABOLIC PANEL
BUN/Creatinine Ratio: 17 (ref 10–24)
BUN: 24 mg/dL (ref 8–27)
CO2: 26 mmol/L (ref 20–29)
Calcium: 9.8 mg/dL (ref 8.6–10.2)
Chloride: 101 mmol/L (ref 96–106)
Creatinine, Ser: 1.4 mg/dL — ABNORMAL HIGH (ref 0.76–1.27)
Glucose: 100 mg/dL — ABNORMAL HIGH (ref 70–99)
Potassium: 4.3 mmol/L (ref 3.5–5.2)
Sodium: 138 mmol/L (ref 134–144)
eGFR: 53 mL/min/{1.73_m2} — ABNORMAL LOW (ref >59)

## 2021-09-07 LAB — LIPID PANEL
Chol/HDL Ratio: 3.6 ratio (ref 0.0–5.0)
Cholesterol, Total: 107 mg/dL (ref 100–199)
HDL: 30 mg/dL — ABNORMAL LOW (ref >39)
LDL Chol Calc (NIH): 56 mg/dL (ref 0–99)
Triglycerides: 111 mg/dL (ref 0–149)
VLDL Cholesterol Cal: 21 mg/dL (ref 5–40)

## 2021-09-07 LAB — HEMOGLOBIN A1C
Est. average glucose Bld gHb Est-mCnc: 103 mg/dL
Hgb A1c MFr Bld: 5.2 % (ref 4.8–5.6)

## 2021-09-07 MED ORDER — SPIRONOLACTONE 25 MG PO TABS: 12.5000 mg | ORAL_TABLET | Freq: Every day | ORAL | 3 refills | Status: DC

## 2021-09-07 MED ORDER — AMLODIPINE BESYLATE 10 MG PO TABS: 10.0000 mg | ORAL_TABLET | Freq: Every day | ORAL | 3 refills | Status: DC

## 2021-09-07 MED ORDER — METOPROLOL SUCCINATE ER 25 MG PO TB24: 25.0000 mg | ORAL_TABLET | Freq: Every day | ORAL | 3 refills | Status: DC

## 2021-09-07 NOTE — Patient Instructions (Signed)
Medication Instructions:  ?Your physician has recommended you make the following change in your medication:  ? ?1) STOP Metoprolol Tartrate ?2) START Metoprolol Succinate '25mg'$  once daily ?3) INCREASE Amlodipine '10mg'$  once daily ? ?*If you need a refill on your cardiac medications before your next appointment, please call your pharmacy* ? ?Lab Work: ?TODAY: Lipids, BMET, Hgb A1c ?If you have labs (blood work) drawn today and your tests are completely normal, you will receive your results only by: ?MyChart Message (if you have MyChart) OR ?A paper copy in the mail ?If you have any lab test that is abnormal or we need to change your treatment, we will call you to review the results. ? ?Testing/Procedures: ?NONE ? ?Follow-Up: ?At [facility], you and your health needs are our priority.  As part of our continuing mission to provide you with exceptional heart care, we have created designated Provider Care Teams.  These Care Teams include your primary Cardiologist (physician) and Advanced Practice Providers (APPs -  Physician Assistants and Nurse Practitioners) who all work together to provide you with the care you need, when you need it. ? ?Your next appointment:   ?6 month(s) ? ?The format for your next appointment:   ?In Person ? ?Provider:   ?Freada Bergeron, MD   ? ?Other Instructions ?Please send a log of your blood pressures via MyChart message or call our office to report at [phone]. ?

## 2021-09-07 NOTE — Progress Notes (Signed)
?Cardiology Office Note:   ? ?Date:  09/07/2021  ? ?ID:  Patrick Sosa, DOB [birth year], MRN [id number] ? ?PCP:  Dorothyann Peng, NP ?  ?[place]  ?Cardiologist:  Freada Bergeron, MD  ?Advanced Practice Provider:  No care team member to display ?Electrophysiologist:  None  ? ?Referring MD: Dorothyann Peng, NP  ? ? ?History of Present Illness:   ? ?Patrick Sosa is a 76 y.o. 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 ? ?Last seen in 08/2020 after he was just diagnosed with prostate cancer. Was doing well from a CV standpoint.  ? ?Today, he is doing well. He underwent a NanoKnife ablation for his prostate cancer in September 2022. Follow-up MRI with residual disease. He is now planned to undergo a robot-assisted proctectomy next month at [facility]. Requesting lovenox bridge again prior to surgery.  ? ?Otherwise, he is doing well from a CV standpoint. Denies chest pain, SOB, LE edema, orthopnea or PND.  ?His blood pressure at home has been high and ranges from the 846N to 629B systolic. No associated symptoms. Notably is on metop '25mg'$  BID and amlodipine '5mg'$  daily. Otherwise, he is remaining active. Denies any bleeding issues on xarelto ? ?The patient denies chest pain, chest pressure, dyspnea at rest or with exertion, palpitations, PND, or orthopnea. Denies cough, fever, chills. Denies nausea, vomiting. Denies syncope or presyncope. Denies dizziness or lightheadedness. Denies snoring. ? ?Past Medical History:  ?Diagnosis Date  ? Chronic anticoagulation   ? Diverticulosis   ? DVT (deep venous thrombosis) ([place])   ? Factor V deficiency ([place])   ? Hx of adenomatous colonic polyps 08/20/2018  ? Hypercholesterolemia   ? Hypertension   ? Kidney stones   ? Pneumonia   ? Polycythemia   ? ? ?Past Surgical History:  ?Procedure Laterality Date  ? CATARACT EXTRACTION,  BILATERAL Bilateral 2018  ? COLONOSCOPY    ? TONSILLECTOMY    ? ? ?Current Medications: ?Current Meds  ?Medication Sig  ? alfuzosin (UROXATRAL) 10 MG 24 hr tablet Take 10 mg by mouth daily with breakfast.  ? amLODipine (NORVASC) 10 MG tablet Take 1 tablet (10 mg total) by mouth daily.  ? dimenhyDRINATE (DRAMAMINE) 50 MG tablet Take 25 mg by mouth at bedtime as needed. As needed for sleep.  ? enoxaparin (LOVENOX) 150 MG/ML injection Inject 1 mL (150 mg total) into the skin daily.  ? metoprolol succinate (TOPROL XL) 25 MG 24 hr tablet Take 1 tablet (25 mg total) by mouth daily.  ? pravastatin (PRAVACHOL) 20 MG tablet Take 1 tablet (20 mg total) by mouth daily.  ? rivaroxaban (XARELTO) 20 MG TABS tablet Take 1 tablet (20 mg total) by mouth daily with supper.  ? sildenafil (VIAGRA) 100 MG tablet Take 100 mg by mouth as needed.  ? [DISCONTINUED] amLODipine (NORVASC) 5 MG tablet TAKE 1 TABLET(5 MG) BY MOUTH DAILY  ? [DISCONTINUED] metoprolol tartrate (LOPRESSOR) 25 MG tablet TAKE 1 TABLET(25 MG) BY MOUTH TWICE DAILY  ? [DISCONTINUED] spironolactone (ALDACTONE) 25 MG tablet Please schedule an appt. In order to receive future refills. (Patient taking differently: 12.5 mg daily. Please schedule an appt. In order to receive future refills.)  ?  ? ?Allergies:   Patient has no known allergies.  ? ?Social History  ? ?Socioeconomic History  ? Marital status: Married  ?  Spouse name: Not on file  ? Number of children: 3  ? Years of education: Not on file  ? Highest education level: Not on file  ?Occupational History  ? Occupation: retired  ?Tobacco Use  ? Smoking status: Former  ?  Types: Cigarettes  ?  Quit date: 10/11/1981  ?  Years since quitting: 39.9  ? Smokeless tobacco: Never  ?Vaping Use  ? Vaping Use: Never used  ?Substance and Sexual Activity  ? Alcohol use: No  ?  Alcohol/week: 0.0 standard drinks  ?  Comment: rarely  ? Drug use: No  ? Sexual activity: Yes  ?Other Topics Concern  ? Not on file  ?Social History Narrative   ? 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 Married   ? Three children   ? Six grandchildren   ?   ? He is active at church    ? ?Social Determinants of Health  ? ?Financial Resource Strain: Not on file  ?Food Insecurity: Not on file  ?Transportation Needs: Not on file  ?Physical Activity: Not on file  ?Stress: Not on file  ?Social Connections: Not on file  ?  ? ?Family History: ?The patient's family history includes Alzheimer's disease in his mother; Arthritis in his mother and sister; Breast cancer in his sister; Heart attack in his father; Hypertension in his father and sister; Pancreatic cancer in his father; Rheum arthritis in his daughter. ? ?ROS:   ?Please see the history of present illness.    ?Review of Systems  ?Constitutional:  Negative for chills and fever.  ?HENT:  Negative for hearing loss.   ?Eyes:  Negative for blurred vision and redness.  ?Respiratory:  Negative for shortness of breath.   ?Cardiovascular:  Negative for chest pain, palpitations, orthopnea, claudication, leg swelling and PND.  ?Gastrointestinal:  Negative for blood in stool, melena, nausea and vomiting.  ?Genitourinary:  Negative for flank pain and hematuria.  ?Musculoskeletal:  Negative for falls and myalgias.  ?Neurological:  Positive for tingling (Bilateral toes). Negative for dizziness and loss of consciousness.  ?Endo/Heme/Allergies:  Negative for polydipsia.  ?Psychiatric/Behavioral:  Negative for substance abuse.   ? ?EKGs/Labs/Other Studies Reviewed:   ? ?The following studies were reviewed today: ?Lower Venous Duplex 12/23/12: ?Summary:  ? ?- Findings consistent with deep vein thrombosis involving  ?  the left posterial tibial vein, left peroneal vein, and  ?  left gastrocnemius vein.  ?- Findings consistent with superficial vein thrombosis  ?  involving the left greater saphenous vein and left small  ?  saphenous vein.  ? ? ?EKG:  EKG was not ordered today ?09/01/20: NSR with 72 ? ?Recent Labs: ?12/29/2020: BUN 22; Creatinine, Ser  1.25; Hemoglobin 15.1; Platelets 143; Potassium 4.0; Sodium 138  ?Recent Lipid Panel ?   ?Component Value Date/Time  ? CHOL 114 04/01/2020 0905  ? TRIG 129 04/01/2020 0905  ? HDL 29 (L) 04/01/2020 0905  ? CHOLHDL 3.9 04/01/2020 0905  ? CHOLHDL 4 08/30/2019 0713  ? VLDL 24.2 08/30/2019 0713  ? [place] 62 04/01/2020 0905  ? LDLDIRECT 65.4 02/23/2012 1049  ? ? ? ?Risk Assessment/Calculations:   ?  ? ? ?Physical Exam:   ? ?VS:  BP 140/78   Pulse 62   Ht '6\' 1"'$  (1.854 m)   Wt 201 lb (91.2 kg)   SpO2 98%   BMI 26.52 kg/m?    ? ?Wt Readings from Last 3 Encounters:  ?09/07/21 201 lb (91.2 kg)  ?  12/30/20 203 lb (92.1 kg)  ?09/01/20 207 lb (93.9 kg)  ?  ? ?GEN:  Well nourished, well developed in no acute distress ?HEENT: Normal ?NECK: No JVD; No carotid bruits ?CARDIAC: RRR, no murmurs, rubs, gallops ?RESPIRATORY:  Clear to auscultation without rales, wheezing or rhonchi  ?ABDOMEN: Soft, non-tender, non-distended ?MUSCULOSKELETAL:  No edema; No deformity  ?SKIN: Warm and dry ?NEUROLOGIC:  Alert and oriented x 3 ?PSYCHIATRIC:  Normal affect  ? ?ASSESSMENT:   ? ?1. Factor V deficiency ([place])   ?2. Primary hypertension   ?3. Benign hypertensive heart disease without heart failure   ?4. Hypercholesterolemia   ?5. Prostate cancer ([place])   ?6. Chronic anticoagulation   ? ? ?PLAN:   ? ?In order of problems listed above: ? ?#HTN: ?Elevated today and running high at home mainly 140-160s.  ?-Increase amlodipine to '10mg'$  daily ?-Continue spiro '25mg'$  daily ?-Change metop to succinate '25mg'$  XL daily; can change to coreg for better BP control if needed ?-Check BMET today ?-Keep BP log and can adjust medications as needed ? ?#Factor V Leiden Deficiency with history of DVT: ?#Chronic AC: ?Tolerating AC. Will need bridge if xarelto held >48 hours prior to possible prostatectomy ?-Continue lifelong xarelto ? ?#HLD: ?Well controlled.  ?-Continue pravastatin '20mg'$  daily ?-Check lipids today ? ?#Remote History of PVC: ?Thought to be due to  hypokalemia at that time. No current symptoms. ?-Change metop to succinate '25mg'$  XL daily; can change to coreg for better BP control if needed ? ?#Prostate Cancer: ?Planned [facility] radical prostate[facility] at [facility] ?-Follow-u

## 2021-09-10 ENCOUNTER — Encounter: Payer: Self-pay | Admitting: Pharmacist

## 2021-09-10 MED ORDER — ENOXAPARIN SODIUM 150 MG/ML IJ SOSY: 150.0000 mg | PREFILLED_SYRINGE | INTRAMUSCULAR | 0 refills | Status: DC

## 2021-09-16 ENCOUNTER — Encounter: Payer: Self-pay | Admitting: Cardiology

## 2021-09-16 DIAGNOSIS — I119 Hypertensive heart disease without heart failure: Secondary | ICD-10-CM

## 2021-09-16 DIAGNOSIS — Z79899 Other long term (current) drug therapy: Secondary | ICD-10-CM

## 2021-09-16 MED ORDER — VALSARTAN 80 MG PO TABS: 80.0000 mg | ORAL_TABLET | Freq: Every day | ORAL | 1 refills | Status: DC

## 2021-09-16 NOTE — Telephone Encounter (Signed)
Freada Bergeron, MD  ?Thank you so much for sending! He is still running too high. Can we start him on valsartan '80mg'$  daily and repeat BMET in 7-10 days? Can he repeat the blood pressure log and let us know how it is running a few days after he starts the valsartan ? ?Pt aware to start taking valsartan 80 mg po daily and come in for repeat BMET in 7-10 days.  ?Scheduled the pt to come in for repeat BMET on 09/25/21.  Confirmed the pharmacy of choice with the pt.  ?Pt aware to send [place] some follow-up recordings while on this med in the next few days.  ?Pt verbalized understanding and agrees with this plan.  ? ? ? ? ?

## 2021-09-22 ENCOUNTER — Encounter: Payer: Self-pay | Admitting: Cardiology

## 2021-09-22 DIAGNOSIS — R195 Other fecal abnormalities: Secondary | ICD-10-CM

## 2021-09-22 DIAGNOSIS — Z7901 Long term (current) use of anticoagulants: Secondary | ICD-10-CM

## 2021-09-22 DIAGNOSIS — K921 Melena: Secondary | ICD-10-CM

## 2021-09-23 NOTE — Telephone Encounter (Signed)
Referral to [facility] placed for this pt for complaints of loose bloody stools and being on long-term anticoagulation.  ?Pt aware that referral is placed and GI office will call him soon to arrange this referral appt.  ?Message sent to the pt to let us know if he would like for [place] to discontinue his valsartan and start him on a different regimen, being he is associating this med with causing him loose stools.  ?Will await pt to message back. ?

## 2021-09-25 ENCOUNTER — Other Ambulatory Visit: Payer: Medicare Other | Admitting: *Deleted

## 2021-09-25 DIAGNOSIS — I119 Hypertensive heart disease without heart failure: Secondary | ICD-10-CM

## 2021-09-25 DIAGNOSIS — Z79899 Other long term (current) drug therapy: Secondary | ICD-10-CM

## 2021-09-25 LAB — BASIC METABOLIC PANEL
BUN/Creatinine Ratio: 16 (ref 10–24)
BUN: 25 mg/dL (ref 8–27)
CO2: 23 mmol/L (ref 20–29)
Calcium: 9.6 mg/dL (ref 8.6–10.2)
Chloride: 103 mmol/L (ref 96–106)
Creatinine, Ser: 1.56 mg/dL — ABNORMAL HIGH (ref 0.76–1.27)
Glucose: 101 mg/dL — ABNORMAL HIGH (ref 70–99)
Potassium: 4.2 mmol/L (ref 3.5–5.2)
Sodium: 144 mmol/L (ref 134–144)
eGFR: 47 mL/min/{1.73_m2} — ABNORMAL LOW (ref >59)

## 2021-09-26 ENCOUNTER — Encounter: Payer: Self-pay | Admitting: Cardiology

## 2021-09-28 ENCOUNTER — Telehealth: Payer: Self-pay

## 2021-09-28 DIAGNOSIS — Z79899 Other long term (current) drug therapy: Secondary | ICD-10-CM

## 2021-09-28 DIAGNOSIS — R195 Other fecal abnormalities: Secondary | ICD-10-CM

## 2021-09-28 NOTE — Telephone Encounter (Signed)
Pt made aware of results and recommendation. Order placed at this time for repeat BMET and appt scheduled for 4/28. ?

## 2021-09-28 NOTE — Telephone Encounter (Signed)
-----   Message from Freada Bergeron, MD sent at 09/26/2021  8:35 AM EDT ----- ?His kidney function is slightly elevated but is still within the range of his prior levels. I suspect some of this was due to mild dehydration in the setting of loose stool and staring the valsartan. Can we have him hydrate and repeat BMET in 2 weeks for monitoring? Otherwise, he electrolytes look good! ?

## 2021-10-06 HISTORY — PX: ROBOT ASSISTED LAPAROSCOPIC RADICAL PROSTATECTOMY: SHX5141

## 2021-10-12 ENCOUNTER — Other Ambulatory Visit: Payer: Self-pay | Admitting: *Deleted

## 2021-10-12 MED ORDER — PRAVASTATIN SODIUM 20 MG PO TABS: 20.0000 mg | ORAL_TABLET | Freq: Every day | ORAL | 3 refills | Status: DC

## 2021-10-16 ENCOUNTER — Other Ambulatory Visit: Payer: Medicare Other | Admitting: *Deleted

## 2021-10-16 DIAGNOSIS — R195 Other fecal abnormalities: Secondary | ICD-10-CM

## 2021-10-16 DIAGNOSIS — Z79899 Other long term (current) drug therapy: Secondary | ICD-10-CM

## 2021-10-16 LAB — BASIC METABOLIC PANEL
BUN/Creatinine Ratio: 14 (ref 10–24)
BUN: 20 mg/dL (ref 8–27)
CO2: 23 mmol/L (ref 20–29)
Calcium: 9.3 mg/dL (ref 8.6–10.2)
Chloride: 99 mmol/L (ref 96–106)
Creatinine, Ser: 1.45 mg/dL — ABNORMAL HIGH (ref 0.76–1.27)
Glucose: 105 mg/dL — ABNORMAL HIGH (ref 70–99)
Potassium: 4.3 mmol/L (ref 3.5–5.2)
Sodium: 136 mmol/L (ref 134–144)
eGFR: 51 mL/min/{1.73_m2} — ABNORMAL LOW (ref >59)

## 2021-10-29 ENCOUNTER — Encounter: Payer: Self-pay | Admitting: Internal Medicine

## 2022-01-11 ENCOUNTER — Other Ambulatory Visit: Payer: Self-pay | Admitting: *Deleted

## 2022-01-11 DIAGNOSIS — Z7901 Long term (current) use of anticoagulants: Secondary | ICD-10-CM

## 2022-01-11 DIAGNOSIS — D682 Hereditary deficiency of other clotting factors: Secondary | ICD-10-CM

## 2022-01-11 MED ORDER — RIVAROXABAN 20 MG PO TABS: 20.0000 mg | ORAL_TABLET | Freq: Every day | ORAL | 1 refills | Status: DC

## 2022-01-11 NOTE — Telephone Encounter (Signed)
  Indication: DVT, factor V deficiency Last office visit: Johney Frame 08/2021 Weight: 91.2 kg  Age: 76 yo  Scr: 1.2, 10/07/2021 CrCl: 71 ml/min   Refill sent for Xarelto '20mg'$  daily.

## 2022-01-15 ENCOUNTER — Ambulatory Visit (INDEPENDENT_AMBULATORY_CARE_PROVIDER_SITE_OTHER): Payer: Medicare Other | Admitting: Podiatry

## 2022-01-15 ENCOUNTER — Ambulatory Visit (INDEPENDENT_AMBULATORY_CARE_PROVIDER_SITE_OTHER): Payer: Medicare Other

## 2022-01-15 DIAGNOSIS — M21612 Bunion of left foot: Secondary | ICD-10-CM | POA: Diagnosis not present

## 2022-01-15 DIAGNOSIS — M21619 Bunion of unspecified foot: Secondary | ICD-10-CM

## 2022-01-15 DIAGNOSIS — M7752 Other enthesopathy of left foot: Secondary | ICD-10-CM

## 2022-01-15 MED ORDER — TRIAMCINOLONE ACETONIDE 10 MG/ML IJ SUSP
10.0000 mg | Freq: Once | INTRAMUSCULAR | Status: AC
  Administered 2022-01-15: 10 mg

## 2022-01-18 NOTE — Progress Notes (Signed)
Subjective:   Patient ID: Patrick Sosa, male   DOB: 76 y.o.   MRN: [id number]   HPI Patient has inflammation and pain around the big toe joint of the left foot with fluid buildup around the joint surface and pain with movement of the big toe joint.  It is local to this area has been present for 6 months worse recently   ROS      Objective:  Physical Exam  Neurovascular status intact with inflammation pain of the left first MPJ with fluid buildup around the joint surface painful when pressed     Assessment:  Inflammatory capsulitis of the first MPJ left cannot rule out gout but most likely is localized     Plan:  H&P x-ray reviewed and went ahead today did sterile prep and injected around the first MPJ periarticular 3 mg Kenalog 5 mg Xylocaine advised on wider shoes and we will see results and if symptoms persist patient will be seen back  X-rays indicate moderate structural bunion no indicate advanced arthritis or other pathology surrounding this surface of the bone

## 2022-03-09 ENCOUNTER — Other Ambulatory Visit: Payer: Self-pay | Admitting: *Deleted

## 2022-03-09 DIAGNOSIS — Z79899 Other long term (current) drug therapy: Secondary | ICD-10-CM

## 2022-03-09 DIAGNOSIS — I119 Hypertensive heart disease without heart failure: Secondary | ICD-10-CM

## 2022-03-09 MED ORDER — VALSARTAN 80 MG PO TABS: 80.0000 mg | ORAL_TABLET | Freq: Every day | ORAL | 1 refills | Status: DC

## 2022-03-15 NOTE — Progress Notes (Deleted)
Cardiology Office Note:    Date:  03/15/2022   ID:  Patrick Sosa, DOB August 24, 1945, MRN [id number]  PCP:  Dorothyann Peng, NP   [place]  Cardiologist:  Freada Bergeron, MD  Advanced Practice Provider:  No care team member to display Electrophysiologist:  None   Referring MD: Dorothyann Peng, NP    History of Present Illness:    Patrick Sosa is a 76 y.o. 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  Last seen in 08/2021 where he was undergoing treatment for his prostate cancer. Was doing well from a CV standpoint.  Today, ***  Past Medical History:  Diagnosis Date   Chronic anticoagulation    Diverticulosis    DVT (deep venous thrombosis) (HCC)    Factor V deficiency (HCC)    Hx of adenomatous colonic polyps 08/20/2018   Hypercholesterolemia    Hypertension    Kidney stones    Pneumonia    Polycythemia     Past Surgical History:  Procedure Laterality Date   CATARACT EXTRACTION, BILATERAL Bilateral 2018   COLONOSCOPY     TONSILLECTOMY      Current Medications: No outpatient medications have been marked as taking for the 03/19/22 encounter (Appointment) with Freada Bergeron, MD.     Allergies:   Patient has no known allergies.   Social History   Socioeconomic History   Marital status: Married    Spouse name: Not on file   Number of children: 3   Years of education: Not on file   Highest education level: Not on file  Occupational History   Occupation: retired  Tobacco Use   Smoking status: Former    Types: Cigarettes    Quit date: 10/11/1981    Years since quitting: 40.4   Smokeless tobacco: Never  Vaping Use   Vaping Use: Never used  Substance and Sexual Activity   Alcohol use: No    Alcohol/week: 0.0 standard drinks of alcohol    Comment: rarely   Drug use: No   Sexual  activity: Yes  Other Topics Concern   Not on file  Social History Narrative   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, maintenance (IT)    Married    Three children    Six grandchildren       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 Strain: [place]  (04/08/2020)   Overall Financial Resource Strain (CARDIA)    Difficulty of Paying Living Expenses: Not hard at all  Food Insecurity: No [place] (04/08/2020)   Hunger Vital Sign    Worried About Running Out of Food in the Last Year: Never true    [place] in the Last Year: Never true  Transportation Needs: No Transportation Needs (04/08/2020)   PRAPARE - Hydrologist (Medical): No    Lack of Transportation (Non-Medical): No  Physical Activity: Insufficiently Active (04/08/2020)   Exercise Vital Sign    Days of Exercise per Week: 4 days    Minutes of Exercise per Session: 20 min  Stress: No Stress Concern Present (04/08/2020)   [place]    Feeling of Stress : Not at all  Social Connections: Moderately Integrated (04/08/2020)  Social Licensed conveyancer [NHANES]    Frequency of Communication with Friends and Family: More than three times a week    Frequency of Social Gatherings with Friends and Family: More than three times a week    Attends Religious Services: More than 4 times per year    Active Member of [employer] or Organizations: No    Attends Music therapist: Never    Marital Status: Married     Family History: The patient's family history includes Alzheimer's disease in his mother; Arthritis in his mother and sister; Breast cancer in his sister; Heart attack in his father; Hypertension in his father and sister; Pancreatic cancer in his father; Rheum arthritis in his daughter.  ROS:   Please see the history of present illness.    Review of Systems  Constitutional:  Negative for  chills and fever.  HENT:  Negative for hearing loss.   Eyes:  Negative for blurred vision and redness.  Respiratory:  Negative for shortness of breath.   Cardiovascular:  Negative for chest pain, palpitations, orthopnea, claudication, leg swelling and PND.  Gastrointestinal:  Negative for blood in stool, melena, nausea and vomiting.  Genitourinary:  Negative for flank pain and hematuria.  Musculoskeletal:  Negative for falls and myalgias.  Neurological:  Positive for tingling (Bilateral toes). Negative for dizziness and loss of consciousness.  Endo/Heme/Allergies:  Negative for polydipsia.  Psychiatric/Behavioral:  Negative for substance abuse.     EKGs/Labs/Other Studies Reviewed:    The following studies were reviewed today: Lower Venous Duplex 12/23/12: Summary:   - Findings consistent with deep vein thrombosis involving    the left posterial tibial vein, left peroneal vein, and    left gastrocnemius vein.  - Findings consistent with superficial vein thrombosis    involving the left greater saphenous vein and left small    saphenous vein.    EKG:  EKG was not ordered today 09/01/20: NSR with 72  Recent Labs: 10/16/2021: BUN 20; Creatinine, Ser 1.45; Potassium 4.3; Sodium 136  Recent Lipid Panel    Component Value Date/Time   CHOL 107 09/07/2021 0951   TRIG 111 09/07/2021 0951   HDL 30 (L) 09/07/2021 0951   CHOLHDL 3.6 09/07/2021 0951   CHOLHDL 4 08/30/2019 0713   VLDL 24.2 08/30/2019 0713   LDLCALC 56 09/07/2021 0951   LDLDIRECT 65.4 02/23/2012 1049     Risk Assessment/Calculations:       Physical Exam:    VS:  There were no vitals taken for this visit.    Wt Readings from Last 3 Encounters:  09/07/21 201 lb (91.2 kg)  12/30/20 203 lb (92.1 kg)  09/01/20 207 lb (93.9 kg)     GEN:  Well nourished, well developed in no acute distress HEENT: Normal NECK: No JVD; No carotid bruits CARDIAC: RRR, no murmurs, rubs, gallops RESPIRATORY:  Clear to auscultation  without rales, wheezing or rhonchi  ABDOMEN: Soft, non-tender, non-distended MUSCULOSKELETAL:  No edema; No deformity  SKIN: Warm and dry NEUROLOGIC:  Alert and oriented x 3 PSYCHIATRIC:  Normal affect   ASSESSMENT:    No diagnosis found.   PLAN:    In order of problems listed above:  #HTN: *** -Continue amlodipine '10mg'$  daily -Continue spiro '25mg'$  daily -Continue metop succinate '25mg'$  XL daily; can change to coreg for better BP control if needed -Keep BP log and can adjust medications as needed  #Factor V Leiden Deficiency with history of DVT: #Chronic AC: Tolerating AC. Will need bridge  if xarelto held >48 hours prior to possible prostatectomy -Continue lifelong xarelto  #HLD: Well controlled.  -Continue pravastatin '20mg'$  daily  #Remote History of PVC: Thought to be due to hypokalemia at that time. No current symptoms. -Continue metop succinate '25mg'$  XL daily; can change to coreg for better BP control if needed  #Prostate Cancer: S/p radical prostatectomy at [facility] -Follow-up at [facility] as scheduled   Medication Adjustments/Labs and Tests Ordered: Current medicines are reviewed at length with the patient today.  Concerns regarding medicines are outlined above.  No orders of the defined types were placed in this encounter.  No orders of the defined types were placed in this encounter.   There are no Patient Instructions on file for this visit.  I,Mykaella Javier,acting as a scribe for Freada Bergeron, MD.,have documented all relevant documentation on the behalf of Freada Bergeron, MD,as directed by  Freada Bergeron, MD while in the presence of Freada Bergeron, MD.  I, Freada Bergeron, MD, have reviewed all documentation for this visit. The documentation on 03/15/22 for the exam, diagnosis, procedures, and orders are all accurate and complete.   Signed, Freada Bergeron, MD  03/15/2022 7:15 AM    [place]

## 2022-03-19 ENCOUNTER — Ambulatory Visit: Payer: Medicare Other | Admitting: Cardiology

## 2022-03-29 NOTE — Progress Notes (Unsigned)
Cardiology Office Note:    Date:  03/29/2022   ID:  Patrick Sosa, DOB [birth year], MRN [id number]  PCP:  Dorothyann Peng, NP   [place] Prov[facility] Cardiologist:  Freada Bergeron, MD { Click to update primary MD,subspecialty MD or APP then REFRESH:1}    Referring MD: Dorothyann Peng, NP   No chief complaint on file. ***  History of Present Illness:    Patrick Sosa is a 76 y.o. male with a hx of ***  Past Medical History:  Diagnosis Date   Chronic anticoagulation    Diverticulosis    DVT (deep venous thrombosis) (HCC)    Factor V deficiency (HCC)    Hx of adenomatous colonic polyps 08/20/2018   Hypercholesterolemia    Hypertension    Kidney stones    Pneumonia    Polycythemia     Past Surgical History:  Procedure Laterality Date   CATARACT EXTRACTION, BILATERAL Bilateral 2018   COLONOSCOPY     TONSILLECTOMY      Current Medications: No outpatient medications have been marked as taking for the 03/30/22 encounter (Appointment) with Richardson Dopp T, PA-C.     Allergies:   Patient has no known allergies.   Social History   Socioeconomic History   Marital status: Married    Spouse name: Not on file   Number of children: 3   Years of education: Not on file   Highest education level: Not on file  Occupational History   Occupation: retired  Tobacco Use   Smoking status: Former    Types: Cigarettes    Quit date: 10/11/1981    Years since quitting: 40.4   Smokeless tobacco: Never  Vaping Use   Vaping Use: Never used  Substance and Sexual Activity   Alcohol use: No    Alcohol/week: 0.0 standard drinks of alcohol    Comment: rarely   Drug use: No   Sexual activity: Yes  Other Topics Concern   Not on file  Social History Narrative   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, maintenance (IT)    Married    Three children    Six grandchildren       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 Strain: [place]  (04/08/2020)   Overall  Financial Resource Strain (CARDIA)    Difficulty of Paying Living Expenses: Not hard at all  Food Insecurity: No [place] (04/08/2020)   Hunger Vital Sign    Worried About Running Out of Food in the Last Year: Never true    [place] in the Last Year: Never true  Transportation Needs: No Transportation Needs (04/08/2020)   PRAPARE - Hydrologist (Medical): No    Lack of Transportation (Non-Medical): No  Physical Activity: Insufficiently Active (04/08/2020)   Exercise Vital Sign    Days of Exercise per Week: 4 days    Minutes of Exercise per Session: 20 min  Stress: No Stress Concern Present (04/08/2020)   [place]    Feeling of Stress : Not at all  Social Connections: Moderately Integrated (04/08/2020)   Social Connection and Isolation Panel [NHANES]    Frequency of Communication with Friends and Family: More than three times a week    Frequency of Social Gatherings with Friends and Family: More than three times a week    Attends Religious Services: More than  4 times per year    Active Member of Clubs or Organizations: No    Attends Archivist Meetings: Never    Marital Status: Married     Family History: The patient's ***family history includes Alzheimer's disease in his mother; Arthritis in his mother and sister; Breast cancer in his sister; Heart attack in his father; Hypertension in his father and sister; Pancreatic cancer in his father; Rheum arthritis in his daughter.  ROS:   Please see the history of present illness.    *** All other systems reviewed and are negative.  EKGs/Labs/Other Studies Reviewed:    The following studies were reviewed today: ***  EKG:  EKG is *** ordered today.  The ekg ordered today demonstrates ***  Recent Labs: 10/16/2021: BUN 20; Creatinine, Ser 1.45; Potassium 4.3; Sodium 136  Recent Lipid Panel    Component Value  Date/Time   CHOL 107 09/07/2021 0951   TRIG 111 09/07/2021 0951   HDL 30 (L) 09/07/2021 0951   CHOLHDL 3.6 09/07/2021 0951   CHOLHDL 4 08/30/2019 0713   VLDL 24.2 08/30/2019 0713   LDLCALC 56 09/07/2021 0951   LDLDIRECT 65.4 02/23/2012 1049     Risk Assessment/Calculations:   {Does this patient have ATRIAL FIBRILLATION?:[phone]}  No BP recorded.  {Refresh Note OR Click here to enter BP  :1}***         Physical Exam:    VS:  There were no vitals taken for this visit.    Wt Readings from Last 3 Encounters:  09/07/21 201 lb (91.2 kg)  12/30/20 203 lb (92.1 kg)  09/01/20 207 lb (93.9 kg)     GEN: *** Well nourished, well developed in no acute distress HEENT: Normal NECK: No JVD; No carotid bruits LYMPHATICS: No lymphadenopathy CARDIAC: ***RRR, no murmurs, rubs, gallops RESPIRATORY:  Clear to auscultation without rales, wheezing or rhonchi  ABDOMEN: Soft, non-tender, non-distended MUSCULOSKELETAL:  No edema; No deformity  SKIN: Warm and dry NEUROLOGIC:  Alert and oriented x 3 PSYCHIATRIC:  Normal affect   ASSESSMENT:    No diagnosis found. PLAN:    In order of problems listed above:  ***      {Are you ordering a CV Procedure (e.g. stress test, cath, DCCV, TEE, etc)?   Press F2        :[id number]}    Medication Adjustments/Labs and Tests Ordered: Current medicines are reviewed at length with the patient today.  Concerns regarding medicines are outlined above.  No orders of the defined types were placed in this encounter.  No orders of the defined types were placed in this encounter.   There are no Patient Instructions on file for this visit.   Signed, Patrick Bud, NP  03/29/2022 9:22 PM    [place]

## 2022-03-30 ENCOUNTER — Encounter: Payer: Self-pay | Admitting: Nurse Practitioner

## 2022-03-30 ENCOUNTER — Ambulatory Visit: Payer: Medicare Other | Attending: Cardiology | Admitting: Nurse Practitioner

## 2022-03-30 VITALS — BP 132/72 | HR 83 | Ht 73.0 in | Wt 202.4 lb

## 2022-03-30 DIAGNOSIS — Z86718 Personal history of other venous thrombosis and embolism: Secondary | ICD-10-CM | POA: Diagnosis not present

## 2022-03-30 DIAGNOSIS — Z7901 Long term (current) use of anticoagulants: Secondary | ICD-10-CM

## 2022-03-30 DIAGNOSIS — I4510 Unspecified right bundle-branch block: Secondary | ICD-10-CM | POA: Insufficient documentation

## 2022-03-30 DIAGNOSIS — E7800 Pure hypercholesterolemia, unspecified: Secondary | ICD-10-CM

## 2022-03-30 DIAGNOSIS — N1831 Chronic kidney disease, stage 3a: Secondary | ICD-10-CM | POA: Insufficient documentation

## 2022-03-30 DIAGNOSIS — I10 Essential (primary) hypertension: Secondary | ICD-10-CM | POA: Diagnosis not present

## 2022-03-30 DIAGNOSIS — D682 Hereditary deficiency of other clotting factors: Secondary | ICD-10-CM

## 2022-03-30 DIAGNOSIS — I493 Ventricular premature depolarization: Secondary | ICD-10-CM | POA: Insufficient documentation

## 2022-03-30 NOTE — Patient Instructions (Addendum)
Medication Instructions:  Your physician recommends that you continue on your current medications as directed. Please refer to the Current Medication list given to you today.  *If you need a refill on your cardiac medications before your next appointment, please call your pharmacy*   Lab Work: TODAY:  BMET, CBC, TSH, & MAG  If you have labs (blood work) drawn today and your tests are completely normal, you will receive your results only by: [place] (if you have MyChart) OR A paper copy in the mail If you have any lab test that is abnormal or we need to change your treatment, we will call you to review the results.   Testing/Procedures: None ordered    Follow-Up: At [facility], you and your health needs are our priority.  As part of our continuing mission to provide you with exceptional heart care, we have created designated Provider Care Teams.  These Care Teams include your primary Cardiologist (physician) and Advanced Practice Providers (APPs -  Physician Assistants and Nurse Practitioners) who all work together to provide you with the care you need, when you need it.  We recommend signing up for the patient portal called "MyChart".  Sign up information is provided on this After Visit Summary.  MyChart is used to connect with patients for Virtual Visits (Telemedicine).  Patients are able to view lab/test results, encounter notes, upcoming appointments, etc.  Non-urgent messages can be sent to your provider as well.   To learn more about what you can do with MyChart, go to [email or web address].    Your next appointment:   5-6  month(s)   09/16/21 8:00  The format for your next appointment:   In Person  Provider:   Freada Bergeron, MD     Other Instructions   Important Information About Sugar

## 2022-03-31 LAB — BASIC METABOLIC PANEL
BUN/Creatinine Ratio: 18 (ref 10–24)
BUN: 25 mg/dL (ref 8–27)
CO2: 23 mmol/L (ref 20–29)
Calcium: 9.6 mg/dL (ref 8.6–10.2)
Chloride: 103 mmol/L (ref 96–106)
Creatinine, Ser: 1.36 mg/dL — ABNORMAL HIGH (ref 0.76–1.27)
Glucose: 91 mg/dL (ref 70–99)
Potassium: 4.1 mmol/L (ref 3.5–5.2)
Sodium: 138 mmol/L (ref 134–144)
eGFR: 55 mL/min/{1.73_m2} — ABNORMAL LOW (ref >59)

## 2022-03-31 LAB — CBC
Hematocrit: 42.5 % (ref 37.5–51.0)
Hemoglobin: 15 g/dL (ref 13.0–17.7)
MCH: 35.5 pg — ABNORMAL HIGH (ref 26.6–33.0)
MCHC: 35.3 g/dL (ref 31.5–35.7)
MCV: 101 fL — ABNORMAL HIGH (ref 79–97)
Platelets: 158 10*3/uL (ref 150–450)
RBC: 4.23 x10E6/uL (ref 4.14–5.80)
RDW: 11.9 % (ref 11.6–15.4)
WBC: 7.5 10*3/uL (ref 3.4–10.8)

## 2022-03-31 LAB — MAGNESIUM: Magnesium: 2.1 mg/dL (ref 1.6–2.3)

## 2022-03-31 LAB — TSH: TSH: 3.04 u[IU]/mL (ref 0.450–4.500)

## 2022-04-30 LAB — PSA: PSA: 0.015

## 2022-05-05 ENCOUNTER — Ambulatory Visit (INDEPENDENT_AMBULATORY_CARE_PROVIDER_SITE_OTHER): Payer: Medicare Other | Admitting: Adult Health

## 2022-05-05 ENCOUNTER — Encounter: Payer: Self-pay | Admitting: Adult Health

## 2022-05-05 VITALS — BP 120/80 | HR 76 | Temp 97.8°F | Ht 72.0 in | Wt 203.0 lb

## 2022-05-05 DIAGNOSIS — E7800 Pure hypercholesterolemia, unspecified: Secondary | ICD-10-CM

## 2022-05-05 DIAGNOSIS — Z0000 Encounter for general adult medical examination without abnormal findings: Secondary | ICD-10-CM | POA: Diagnosis not present

## 2022-05-05 DIAGNOSIS — N1831 Chronic kidney disease, stage 3a: Secondary | ICD-10-CM

## 2022-05-05 DIAGNOSIS — D682 Hereditary deficiency of other clotting factors: Secondary | ICD-10-CM

## 2022-05-05 DIAGNOSIS — Z8546 Personal history of malignant neoplasm of prostate: Secondary | ICD-10-CM

## 2022-05-05 DIAGNOSIS — I10 Essential (primary) hypertension: Secondary | ICD-10-CM | POA: Diagnosis not present

## 2022-05-05 DIAGNOSIS — Z1211 Encounter for screening for malignant neoplasm of colon: Secondary | ICD-10-CM

## 2022-05-05 NOTE — Progress Notes (Signed)
Subjective:    Patient ID: Patrick Sosa, male    DOB: 05-25-1946, 76 y.o.   MRN: [id number]  HPI Patient presents for yearly preventative medicine examination. 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  HTN -managed by cardiology.  Prescribed Norvasc 10 mg,  Toprol 25 mg XL daily, Valsartan  80 mg daily,  and spironolactone 25 mg daily.  He does monitor his blood pressures at home and reports readings in the 120s over 80s.  He denies chest pain, shortness of breath, headaches, blurred vision, or syncopal episodes BP Readings from Last 3 Encounters:  05/05/22 120/80  03/30/22 132/72  09/07/21 140/78    Hyperlipidemia-currently managed with pravastatin 20 mg daily. Marland Kitchen  He denies myalgia or fatigue Lab Results  Component Value Date   CHOL 107 09/07/2021   HDL 30 (L) 09/07/2021   LDLCALC 56 09/07/2021   LDLDIRECT 65.4 02/23/2012   TRIG 111 09/07/2021   CHOLHDL 3.6 09/07/2021   History of Prostate Cancer -PSA level prior to biopsy on July 18, 2020 was 5.82 compared to baseline of approximately 3.41.  His prostate biopsy revealed Gleason 7 disease in 1 core.  He pursued focal nano knife treatment on March 04, 2021 at [facility].  He was found to have recurrent Gleason 8 prostate cancer after preserving focal needle-knife therapy and after discussing treatments he decided on definitive full gland treatment with robotic prostatectomy 10/06/2021.  He notes that he does have leakage, however this is worse at night compared to during the day.  He denies any infections or rashes. He is managed with uroxatrol 10 mg daily. His last PSA was in August 2023 and was 0.00.   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  He  is on life long anticoagulation with Xarelto  All immunizations and health maintenance protocols were reviewed with the patient and needed orders were placed.  Appropriate screening laboratory values were ordered for the patient including screening of hyperlipidemia, renal function and hepatic function. If indicated by BPH, a PSA was ordered.  Medication reconciliation,  past medical history, social history, problem list and allergies were reviewed in detail with the patient  Goals were established with regard to weight loss, exercise, and  diet in compliance with medications. He is staying active and tries to eat healthy  Wt Readings from Last 3 Encounters:  05/05/22 203 lb (92.1 kg)  03/30/22 202 lb 6.4 oz (91.8 kg)  09/07/21 201 lb (91.2 kg)   He is overdue for three year colonoscopy.   Review of Systems  Constitutional: Negative.   HENT:  Positive for hearing loss.   Eyes: Negative.   Respiratory: Negative.    Cardiovascular: Negative.   Gastrointestinal: Negative.   Endocrine: Negative.   Genitourinary: Negative.   Musculoskeletal: Negative.   Skin: Negative.   Allergic/Immunologic: Negative.   Neurological: Negative.   Hematological: Negative.   Psychiatric/Behavioral: Negative.    All other systems reviewed and are negative.  Past Medical History:  Diagnosis Date   Chronic anticoagulation    Diverticulosis    DVT (deep venous thrombosis) (HCC)    Factor V deficiency (HCC)    Hx of adenomatous colonic polyps 08/20/2018   Hypercholesterolemia    Hypertension    Kidney stones    Pneumonia  Polycythemia     Social History   Socioeconomic History   Marital status: Married    Spouse name: Not on file   Number of children: 3   Years of education: Not on file   Highest education level: Not on file  Occupational History   Occupation: retired  Tobacco Use   Smoking status: Former    Types: Cigarettes    Quit date: 10/11/1981    Years since quitting: 40.5    Smokeless tobacco: Never  Vaping Use   Vaping Use: Never used  Substance and Sexual Activity   Alcohol use: No    Alcohol/week: 0.0 standard drinks of alcohol    Comment: rarely   Drug use: No   Sexual activity: Yes  Other Topics Concern   Not on file  Social History Narrative   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, maintenance (IT)    Married    Three children    Six grandchildren       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 Strain: [place]  (04/08/2020)   Overall Financial Resource Strain (CARDIA)    Difficulty of Paying Living Expenses: Not hard at all  Food Insecurity: No [place] (04/08/2020)   Hunger Vital Sign    Worried About Running Out of Food in the Last Year: Never true    [place] in the Last Year: Never true  Transportation Needs: No Transportation Needs (04/08/2020)   PRAPARE - Hydrologist (Medical): No    Lack of Transportation (Non-Medical): No  Physical Activity: Insufficiently Active (04/08/2020)   Exercise Vital Sign    Days of Exercise per Week: 4 days    Minutes of Exercise per Session: 20 min  Stress: No Stress Concern Present (04/08/2020)   [place]    Feeling of Stress : Not at all  Social Connections: Moderately Integrated (04/08/2020)   Social Connection and Isolation Panel [NHANES]    Frequency of Communication with Friends and Family: More than three times a week    Frequency of Social Gatherings with Friends and Family: More than three times a week    Attends Religious Services: More than 4 times per year    Active Member of [employer] or Organizations: No    Attends Archivist Meetings: Never    Marital Status: Married  Human resources officer Violence: Not At Risk (04/08/2020)   Humiliation, Afraid, Rape, and Kick questionnaire    Fear of Current or Ex-Partner: No    Emotionally Abused: No    Physically  Abused: No    Sexually Abused: No    Past Surgical History:  Procedure Laterality Date   CATARACT EXTRACTION, BILATERAL Bilateral 2018   COLONOSCOPY     TONSILLECTOMY      Family History  Problem Relation Age of Onset   Pancreatic cancer Father    Hypertension Father    Heart attack Father    Alzheimer's disease Mother    Arthritis Mother    Hypertension Sister    Breast cancer Sister    Arthritis Sister    Rheum arthritis Daughter     No Known Allergies  Current Outpatient Medications on File Prior to Visit  Medication Sig Dispense Refill   alfuzosin (UROXATRAL) 10 MG 24 hr tablet Take 10 mg by mouth daily with breakfast.     amLODipine (NORVASC) 10  MG tablet Take 1 tablet (10 mg total) by mouth daily. 90 tablet 3   dimenhyDRINATE (DRAMAMINE) 50 MG tablet Take 25 mg by mouth at bedtime as needed. As needed for sleep.     metoprolol succinate (TOPROL XL) 25 MG 24 hr tablet Take 1 tablet (25 mg total) by mouth daily. 90 tablet 3   pravastatin (PRAVACHOL) 20 MG tablet Take 1 tablet (20 mg total) by mouth daily. 90 tablet 3   rivaroxaban (XARELTO) 20 MG TABS tablet Take 1 tablet (20 mg total) by mouth daily with supper. 90 tablet 1   spironolactone (ALDACTONE) 25 MG tablet Take 0.5 tablets (12.5 mg total) by mouth daily. 45 tablet 3   tadalafil (CIALIS) 5 MG tablet Take 5 mg by mouth daily. Take 3 times per week.     valsartan (DIOVAN) 80 MG tablet Take 1 tablet (80 mg total) by mouth daily. 90 tablet 1   No current facility-administered medications on file prior to visit.    BP 120/80   Pulse 76   Temp 97.8 F (36.6 C) (Oral)   Ht 6' (1.829 m)   Wt 203 lb (92.1 kg)   SpO2 98%   BMI 27.53 kg/m       Objective:   Physical Exam Vitals and nursing note reviewed.  Constitutional:      General: He is not in acute distress.    Appearance: Normal appearance. He is well-developed and normal weight.  HENT:     Head: Normocephalic and atraumatic.     Right Ear:  Tympanic membrane, ear canal and external ear normal. There is no impacted cerumen.     Left Ear: Tympanic membrane, ear canal and external ear normal. There is no impacted cerumen.     Ears:     Comments: Hearing aids     Nose: Nose normal. No congestion or rhinorrhea.     Mouth/Throat:     Mouth: Mucous membranes are moist.     Pharynx: Oropharynx is clear. No oropharyngeal exudate or posterior oropharyngeal erythema.  Eyes:     General:        Right eye: No discharge.        Left eye: No discharge.     Extraocular Movements: Extraocular movements intact.     Conjunctiva/sclera: Conjunctivae normal.     Pupils: Pupils are equal, round, and reactive to light.  Neck:     Vascular: No carotid bruit.     Trachea: No tracheal deviation.  Cardiovascular:     Rate and Rhythm: Normal rate and regular rhythm.     Pulses: Normal pulses.     Heart sounds: Normal heart sounds. No murmur heard.    No friction rub. No gallop.  Pulmonary:     Effort: Pulmonary effort is normal. No respiratory distress.     Breath sounds: Normal breath sounds. No stridor. No wheezing, rhonchi or rales.  Chest:     Chest wall: No tenderness.  Abdominal:     General: Bowel sounds are normal. There is no distension.     Palpations: Abdomen is soft. There is no mass.     Tenderness: There is no abdominal tenderness. There is no right CVA tenderness, left CVA tenderness, guarding or rebound.     Hernia: No hernia is present.  Musculoskeletal:        General: No swelling, tenderness, deformity or signs of injury. Normal range of motion.     Right lower leg: No edema.  Left lower leg: No edema.  Lymphadenopathy:     Cervical: No cervical adenopathy.  Skin:    General: Skin is warm and dry.     Capillary Refill: Capillary refill takes less than 2 seconds.     Coloration: Skin is not jaundiced or pale.     Findings: No bruising, erythema, lesion or rash.  Neurological:     General: No focal deficit present.      Mental Status: He is alert and oriented to person, place, and time.     Cranial Nerves: No cranial nerve deficit.     Sensory: No sensory deficit.     Motor: No weakness.     Coordination: Coordination normal.     Gait: Gait normal.     Deep Tendon Reflexes: Reflexes normal.  Psychiatric:        Mood and Affect: Mood normal.        Behavior: Behavior normal.        Thought Content: Thought content normal.        Judgment: Judgment normal.       Assessment & Plan:  1. Routine general medical examination at a health care facility - Doing well. All his labs have been done recently. Do not see any reason to redo these.   2. Essential hypertension - Controlled. Follow up with Cardiology   3. Hypercholesterolemia - Continue statin   4. Stage 3a chronic kidney disease (CKD) (HCC) - Stable.   5. Factor V deficiency ([place]); Hx of DVT (on long term anticoagulation) - Continue anticoagulation   6. History of prostate cancer - Follow up with Urology as directed  7. Colon cancer screening - Follow up with GI - he plans to do it after the first of the year.   Dorothyann Peng, NP

## 2022-05-12 ENCOUNTER — Encounter: Payer: Self-pay | Admitting: Adult Health

## 2022-06-15 ENCOUNTER — Ambulatory Visit (INDEPENDENT_AMBULATORY_CARE_PROVIDER_SITE_OTHER): Payer: Medicare Other

## 2022-06-15 VITALS — Ht 72.0 in | Wt 203.0 lb

## 2022-06-15 DIAGNOSIS — Z0000 Encounter for general adult medical examination without abnormal findings: Secondary | ICD-10-CM

## 2022-06-15 NOTE — Patient Instructions (Addendum)
Mr. Patrick Sosa , Thank you for taking time to come for your Medicare Wellness Visit. I appreciate your ongoing commitment to your health goals. Please review the following plan we discussed and let me know if I can assist you in the future.   These are the goals we discussed:  Goals       Exercise 3x per week (30 min per time)      Patient stated (pt-stated)      I want to be incontinent free.        This is a list of the screening recommended for you and due dates:  Health Maintenance  Topic Date Due   DTaP/Tdap/Td vaccine (2 - Tdap) 06/03/2020   COVID-19 Vaccine (5 - 2023-24 season) 07/01/2022*   Zoster (Shingles) Vaccine (1 of 2) 09/14/2022*   Flu Shot  09/19/2022*   Colon Cancer Screening  06/16/2023*   Hepatitis C Screening: USPSTF Recommendation to screen - Ages 18-79 yo.  06/16/2023*   Medicare Annual Wellness Visit  06/16/2023   Pneumonia Vaccine  Completed   HPV Vaccine  Aged Out  *Topic was postponed. The date shown is not the original due date.    Advanced directives: Please bring a copy of your health care power of attorney and living will to the office to be added to your chart at your convenience.   Conditions/risks identified: None  Next appointment: Follow up in one year for your annual wellness visit.    Preventive Care 74 Years and Older, Male  Preventive care refers to lifestyle choices and visits with your health care provider that can promote health and wellness. What does preventive care include? A yearly physical exam. This is also called an annual well check. Dental exams once or twice a year. Routine eye exams. Ask your health care provider how often you should have your eyes checked. Personal lifestyle choices, including: Daily care of your teeth and gums. Regular physical activity. Eating a healthy diet. Avoiding tobacco and drug use. Limiting alcohol use. Practicing safe sex. Taking low doses of aspirin every day. Taking vitamin and mineral  supplements as recommended by your health care provider. What happens during an annual well check? The services and screenings done by your health care provider during your annual well check will depend on your age, overall health, lifestyle risk factors, and family history of disease. Counseling  Your health care provider may ask you questions about your: Alcohol use. Tobacco use. Drug use. Emotional well-being. Home and relationship well-being. Sexual activity. Eating habits. History of falls. Memory and ability to understand (cognition). Work and work Statistician. Screening  You may have the following tests or measurements: Height, weight, and BMI. Blood pressure. Lipid and cholesterol levels. These may be checked every 5 years, or more frequently if you are over 65 years old. Skin check. Lung cancer screening. You may have this screening every year starting at age 14 if you have a 30-pack-year history of smoking and currently smoke or have quit within the past 15 years. Fecal occult blood test (FOBT) of the stool. You may have this test every year starting at age 70. Flexible sigmoidoscopy or colonoscopy. You may have a sigmoidoscopy every 5 years or a colonoscopy every 10 years starting at age 77. Prostate cancer screening. Recommendations will vary depending on your family history and other risks. Hepatitis C blood test. Hepatitis B blood test. Sexually transmitted disease (STD) testing. Diabetes screening. This is done by checking your blood sugar (glucose) after you  have not eaten for a while (fasting). You may have this done every 1-3 years. Abdominal aortic aneurysm (AAA) screening. You may need this if you are a current or former smoker. Osteoporosis. You may be screened starting at age 56 if you are at high risk. Talk with your health care provider about your test results, treatment options, and if necessary, the need for more tests. Vaccines  Your health care provider  may recommend certain vaccines, such as: Influenza vaccine. This is recommended every year. Tetanus, diphtheria, and acellular pertussis (Tdap, Td) vaccine. You may need a Td booster every 10 years. Zoster vaccine. You may need this after age 38. Pneumococcal 13-valent conjugate (PCV13) vaccine. One dose is recommended after age 33. Pneumococcal polysaccharide (PPSV23) vaccine. One dose is recommended after age 48. Talk to your health care provider about which screenings and vaccines you need and how often you need them. This information is not intended to replace advice given to you by your health care provider. Make sure you discuss any questions you have with your health care provider. Document Released: 07/04/2015 Document Revised: 02/25/2016 Document Reviewed: 04/08/2015 Elsevier Interactive Patient Education  2017 [place] Prevention in the Home Falls can cause injuries. They can happen to people of all ages. There are many things you can do to make your home safe and to help prevent falls. What can I do on the outside of my home? Regularly fix the edges of walkways and driveways and fix any cracks. Remove anything that might make you trip as you walk through a door, such as a raised step or threshold. Trim any bushes or trees on the path to your home. Use bright outdoor lighting. Clear any walking paths of anything that might make someone trip, such as rocks or tools. Regularly check to see if handrails are loose or broken. Make sure that both sides of any steps have handrails. Any raised decks and porches should have guardrails on the edges. Have any leaves, snow, or ice cleared regularly. Use sand or salt on walking paths during winter. Clean up any spills in your garage right away. This includes oil or grease spills. What can I do in the bathroom? Use night lights. Install grab bars by the toilet and in the tub and shower. Do not use towel bars as grab bars. Use  non-skid mats or decals in the tub or shower. If you need to sit down in the shower, use a plastic, non-slip stool. Keep the floor dry. Clean up any water that spills on the floor as soon as it happens. Remove soap buildup in the tub or shower regularly. Attach bath mats securely with double-sided non-slip rug tape. Do not have throw rugs and other things on the floor that can make you trip. What can I do in the bedroom? Use night lights. Make sure that you have a light by your bed that is easy to reach. Do not use any sheets or blankets that are too big for your bed. They should not hang down onto the floor. Have a firm chair that has side arms. You can use this for support while you get dressed. Do not have throw rugs and other things on the floor that can make you trip. What can I do in the kitchen? Clean up any spills right away. Avoid walking on wet floors. Keep items that you use a lot in easy-to-reach places. If you need to reach something above you, use a strong step  stool that has a grab bar. Keep electrical cords out of the way. Do not use floor polish or wax that makes floors slippery. If you must use wax, use non-skid floor wax. Do not have throw rugs and other things on the floor that can make you trip. What can I do with my stairs? Do not leave any items on the stairs. Make sure that there are handrails on both sides of the stairs and use them. Fix handrails that are broken or loose. Make sure that handrails are as long as the stairways. Check any carpeting to make sure that it is firmly attached to the stairs. Fix any carpet that is loose or worn. Avoid having throw rugs at the top or bottom of the stairs. If you do have throw rugs, attach them to the floor with carpet tape. Make sure that you have a light switch at the top of the stairs and the bottom of the stairs. If you do not have them, ask someone to add them for you. What else can I do to help prevent falls? Wear  shoes that: Do not have high heels. Have rubber bottoms. Are comfortable and fit you well. Are closed at the toe. Do not wear sandals. If you use a stepladder: Make sure that it is fully opened. Do not climb a closed stepladder. Make sure that both sides of the stepladder are locked into place. Ask someone to hold it for you, if possible. Clearly mark and make sure that you can see: Any grab bars or handrails. First and last steps. Where the edge of each step is. Use tools that help you move around (mobility aids) if they are needed. These include: Canes. Walkers. Scooters. Crutches. Turn on the lights when you go into a dark area. Replace any light bulbs as soon as they burn out. Set up your furniture so you have a clear path. Avoid moving your furniture around. If any of your floors are uneven, fix them. If there are any pets around you, be aware of where they are. Review your medicines with your doctor. Some medicines can make you feel dizzy. This can increase your chance of falling. Ask your doctor what other things that you can do to help prevent falls. This information is not intended to replace advice given to you by your health care provider. Make sure you discuss any questions you have with your health care provider. Document Released: 04/03/2009 Document Revised: 11/13/2015 Document Reviewed: 07/12/2014 Elsevier Interactive Patient Education  2017 [employer].

## 2022-06-15 NOTE — Progress Notes (Signed)
Subjective:   Patrick Sosa is a 76 y.o. male who presents for Medicare Annual/Subsequent preventive examination.  Review of Systems    Virtual Visit via Telephone Note  I connected with  Patrick Sosa on 06/15/22 at  8:45 AM EST by telephone and verified that I am speaking with the correct person using two identifiers.  Location: Patient: Home Provider: Office Persons participating in the virtual visit: patient/Nurse Health Advisor   I discussed the limitations, risks, security and privacy concerns of performing an evaluation and management service by telephone and the availability of in person appointments. The patient expressed understanding and agreed to proceed.  Interactive audio and video telecommunications were attempted between this nurse and patient, however failed, due to patient having technical difficulties OR patient did not have access to video capability.  We continued and completed visit with audio only.  Some vital signs may be absent or patient reported.   Criselda Peaches, LPN  Cardiac Risk Factors include: advanced age (>81mn, >>62women);hypertension;male gender     Objective:    Today's Vitals   06/15/22 0847  Weight: 203 lb (92.1 kg)  Height: 6' (1.829 m)   Body mass index is 27.53 kg/m.     06/15/2022    8:59 AM 04/08/2020    8:31 AM  Advanced Directives  Does Patient Have a Medical Advance Directive? Yes No  Type of AParamedicof ATappanLiving will   Copy of HParkin Chart? No - copy requested   Would patient like information on creating a medical advance directive?  No - Patient declined    Current Medications (verified) Outpatient Encounter Medications as of 06/15/2022  Medication Sig   alfuzosin (UROXATRAL) 10 MG 24 hr tablet Take 10 mg by mouth daily with breakfast. (Patient not taking: Reported on 06/15/2022)   amLODipine (NORVASC) 10 MG tablet Take 1 tablet (10 mg total) by mouth daily.    dimenhyDRINATE (DRAMAMINE) 50 MG tablet Take 25 mg by mouth at bedtime as needed. As needed for sleep.   metoprolol succinate (TOPROL XL) 25 MG 24 hr tablet Take 1 tablet (25 mg total) by mouth daily.   pravastatin (PRAVACHOL) 20 MG tablet Take 1 tablet (20 mg total) by mouth daily.   rivaroxaban (XARELTO) 20 MG TABS tablet Take 1 tablet (20 mg total) by mouth daily with supper.   spironolactone (ALDACTONE) 25 MG tablet Take 0.5 tablets (12.5 mg total) by mouth daily.   tadalafil (CIALIS) 5 MG tablet Take 5 mg by mouth daily. Take 3 times per week.   valsartan (DIOVAN) 80 MG tablet Take 1 tablet (80 mg total) by mouth daily.   No facility-administered encounter medications on file as of 06/15/2022.    Allergies (verified) Patient has no known allergies.   History: Past Medical History:  Diagnosis Date   Chronic anticoagulation    Diverticulosis    DVT (deep venous thrombosis) (HCC)    Factor V deficiency (HCC)    Hx of adenomatous colonic polyps 08/20/2018   Hypercholesterolemia    Hypertension    Kidney stones    Pneumonia    Polycythemia    Past Surgical History:  Procedure Laterality Date   CATARACT EXTRACTION, BILATERAL Bilateral 2018   COLONOSCOPY     TONSILLECTOMY     Family History  Problem Relation Age of Onset   Pancreatic cancer Father    Hypertension Father    Heart attack Father    Alzheimer's disease  Mother    Arthritis Mother    Hypertension Sister    Breast cancer Sister    Arthritis Sister    Rheum arthritis Daughter    Social History   Socioeconomic History   Marital status: Married    Spouse name: Not on file   Number of children: 3   Years of education: Not on file   Highest education level: Not on file  Occupational History   Occupation: retired  Tobacco Use   Smoking status: Former    Types: Cigarettes    Quit date: 10/11/1981    Years since quitting: 40.7   Smokeless tobacco: Never  Vaping Use   Vaping Use: Never used  Substance  and Sexual Activity   Alcohol use: No    Alcohol/week: 0.0 standard drinks of alcohol    Comment: rarely   Drug use: No   Sexual activity: Yes  Other Topics Concern   Not on file  Social History Narrative   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, maintenance (IT)    Married    Three children    Six grandchildren       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 Strain: [place]  (06/15/2022)   Overall Financial Resource Strain (CARDIA)    Difficulty of Paying Living Expenses: Not hard at all  Food Insecurity: No [place] (06/15/2022)   Hunger Vital Sign    Worried About Running Out of Food in the Last Year: Never true    [place] in the Last Year: Never true  Transportation Needs: No Transportation Needs (06/15/2022)   PRAPARE - Hydrologist (Medical): No    Lack of Transportation (Non-Medical): No  Physical Activity: Sufficiently Active (06/15/2022)   Exercise Vital Sign    Days of Exercise per Week: 5 days    Minutes of Exercise per Session: 30 min  Stress: No Stress Concern Present (06/15/2022)   [place]    Feeling of Stress : Not at all  Social Connections: [place] (06/15/2022)   Social Connection and Isolation Panel [NHANES]    Frequency of Communication with Friends and Family: More than three times a week    Frequency of Social Gatherings with Friends and Family: More than three times a week    Attends Religious Services: More than 4 times per year    Active Member of [employer] or Organizations: Yes    Attends Music therapist: More than 4 times per year    Marital Status: Married    Tobacco Counseling Counseling given: Not Answered   Clinical Intake:  Pre-visit preparation completed: No  Pain : No/denies pain     BMI - recorded: 23.53 Nutritional Status: BMI 25 -29 Overweight Nutritional Risks: None Diabetes:  No  How often do you need to have someone help you when you read instructions, pamphlets, or other written materials from your doctor or pharmacy?: 1 - Never  Diabetic?  No  Interpreter Needed?: No  Information entered by :: Rolene Arbour LPN   Activities of Daily Living    06/15/2022    8:56 AM 05/05/2022    8:02 AM  In your present state of health, do you have any difficulty performing the following activities:  Hearing? 1 0  Comment Wears hearing aids hearing aids  Vision? 0 0  Difficulty concentrating or making decisions?  0 0  Walking or climbing stairs? 0 0  Dressing or bathing? 0 0  Doing errands, shopping? 0 0  Preparing Food and eating ? N   Using the Toilet? N   In the past six months, have you accidently leaked urine? Y   Comment Wears breifs. Followed by Urologist   Do you have problems with loss of bowel control? N   Managing your Medications? N   Managing your Finances? N   Housekeeping or managing your Housekeeping? N     Patient Care Team: Dorothyann Peng, NP as PCP - General (Family Medicine) Freada Bergeron, MD as PCP - Cardiology (Cardiology) Irine Seal, MD as Attending Physician (Urology)  Indicate any recent Medical Services you may have received from other than [facility] providers in the past year (date may be approximate).     Assessment:   This is a routine wellness examination for Abdur.  Hearing/Vision screen Hearing Screening - Comments:: Wears hearing aids Vision Screening - Comments:: Wears rx Implants - up to date with routine eye exams with  Dr Eulas Post  Dietary issues and exercise activities discussed: Current Exercise Habits: Home exercise routine, Type of exercise: walking, Time (Minutes): 30, Frequency (Times/Week): 5, Weekly Exercise (Minutes/Week): 150, Intensity: Moderate, Exercise limited by: None identified   Goals Addressed               This Visit's Progress     Patient stated (pt-stated)        I want to be incontinent  free.       Depression Screen    06/15/2022    8:54 AM 05/05/2022    8:02 AM 04/08/2020    8:33 AM 08/22/2019    8:25 AM 04/14/2016    9:25 AM 04/01/2015    8:44 AM 01/28/2014    8:02 AM  PHQ 2/9 Scores  PHQ - 2 Score 0 0 0 0 0 0 0  PHQ- 9 Score 0 0 0        Fall Risk    06/15/2022    8:57 AM 05/05/2022    8:02 AM 04/08/2020    8:32 AM 08/22/2019    8:25 AM 04/14/2016    9:25 AM  Fall Risk   Falls in the past year? 0 0 0 0 No  Number falls in past yr: 0 0 0    Injury with Fall? 0 0 0    Risk for fall due to : No Fall Risks No Fall Risks Medication side effect    Follow up Falls prevention discussed Falls evaluation completed Falls evaluation completed;Falls prevention discussed      FALL RISK PREVENTION PERTAINING TO THE HOME:  Any stairs in or around the home? Yes  If so, are there any without handrails? No  Home free of loose throw rugs in walkways, pet beds, electrical cords, etc? Yes  Adequate lighting in your home to reduce risk of falls? Yes   ASSISTIVE DEVICES UTILIZED TO PREVENT FALLS:  Life alert? No  Use of a cane, walker or w/c? No  Grab bars in the bathroom? No  Shower chair or bench in shower? No  Elevated toilet seat or a handicapped toilet? No   TIMED UP AND GO:  Was the test performed? No . Audio Visit    Cognitive Function:        06/15/2022    8:59 AM  6CIT Screen  What Year? 0 points  What month? 0 points  What time? 0 points  Count back from 20 0 points  Months in reverse 0 points  Repeat phrase 0 points  Total Score 0 points    Immunizations Immunization History  Administered Date(s) Administered   Influenza Split 04/23/2011, 04/07/2012   Influenza Whole 06/02/2009, 05/21/2010   Influenza, High Dose Seasonal PF 04/01/2015, 04/14/2016, 04/19/2017, 04/18/2018, 03/06/2019   Influenza-Unspecified 03/21/2014, 02/20/2020, 03/29/2021   Moderna Sars-Covid-2 Vaccination 07/20/2019, 08/17/2019, 04/19/2020, 06/01/2021   Pneumococcal  Conjugate-13 04/14/2016   Pneumococcal Polysaccharide-23 06/30/2013   Td 06/03/2010   Zoster, Live 06/03/2010    TDAP status: Due, Education has been provided regarding the importance of this vaccine. Advised may receive this vaccine at local pharmacy or Health Dept. Aware to provide a copy of the vaccination record if obtained from local pharmacy or Health Dept. Verbalized acceptance and understanding.  Flu Vaccine status: Up to date  Pneumococcal vaccine status: Up to date  Covid-19 vaccine status: Completed vaccines  Qualifies for Shingles Vaccine? Yes   Zostavax completed No   Shingrix Completed?: No.    Education has been provided regarding the importance of this vaccine. Patient has been advised to call insurance company to determine out of pocket expense if they have not yet received this vaccine. Advised may also receive vaccine at local pharmacy or Health Dept. Verbalized acceptance and understanding.  Screening Tests Health Maintenance  Topic Date Due   DTaP/Tdap/Td (2 - Tdap) 06/03/2020   COVID-19 Vaccine (5 - 2023-24 season) 07/01/2022 (Originally 02/19/2022)   Zoster Vaccines- Shingrix (1 of 2) 09/14/2022 (Originally 05/22/1965)   INFLUENZA VACCINE  09/19/2022 (Originally 01/19/2022)   COLONOSCOPY (Pts 45-59yr Insurance coverage will need to be confirmed)  06/16/2023 (Originally 08/16/2021)   Hepatitis C Screening  06/16/2023 (Originally 05/21/1964)   Medicare Annual Wellness (AWV)  06/16/2023   Pneumonia Vaccine 76 Years old  Completed   HPV VACCINES  Aged Out    Health Maintenance  Health Maintenance Due  Topic Date Due   DTaP/Tdap/Td (2 - Tdap) 06/03/2020    Colorectal cancer screening: Referral to GI placed Deferred. Pt aware the office will call re: appt.  Lung Cancer Screening: (Low Dose CT Chest recommended if Age 74-80years, 30 pack-year currently smoking OR have quit w/in 15years.) does not qualify.     Additional Screening:  Hepatitis C Screening:  does qualify; Deferred  Vision Screening: Recommended annual ophthalmology exams for early detection of glaucoma and other disorders of the eye. Is the patient up to date with their annual eye exam?  Yes  Who is the provider or what is the name of the office in which the patient attends annual eye exams? Dr GEulas PostIf pt is not established with a provider, would they like to be referred to a provider to establish care? No .   Dental Screening: Recommended annual dental exams for proper oral hygiene  Community Resource Referral / Chronic Care Management:  CRR required this visit?  No   CCM required this visit?  No      Plan:     I have personally reviewed and noted the following in the patient's chart:   Medical and social history Use of alcohol, tobacco or illicit drugs  Current medications and supplements including opioid prescriptions. Patient is not currently taking opioid prescriptions. Functional ability and status Nutritional status Physical activity Advanced directives List of other physicians Hospitalizations, surgeries, and ER visits in previous 12 months Vitals Screenings to include cognitive, depression, and falls Referrals and appointments  In addition, I have reviewed and  discussed with patient certain preventive protocols, quality metrics, and best practice recommendations. A written personalized care plan for preventive services as well as general preventive health recommendations were provided to patient.     Criselda Peaches, LPN   [id number]   Nurse Notes: Patient due Hep-C Screening and follow up with advised recommendation for a Colonoscopy after Prostate Surgery.

## 2022-06-24 ENCOUNTER — Encounter: Payer: Self-pay | Admitting: Physician Assistant

## 2022-07-08 ENCOUNTER — Other Ambulatory Visit: Payer: Self-pay | Admitting: *Deleted

## 2022-07-08 DIAGNOSIS — Z7901 Long term (current) use of anticoagulants: Secondary | ICD-10-CM

## 2022-07-08 DIAGNOSIS — D682 Hereditary deficiency of other clotting factors: Secondary | ICD-10-CM

## 2022-07-08 MED ORDER — RIVAROXABAN 20 MG PO TABS: 20.0000 mg | ORAL_TABLET | Freq: Every day | ORAL | 1 refills | Status: DC

## 2022-07-08 NOTE — Telephone Encounter (Signed)
Xarelto '20mg'$  refill request received. Pt is 77 years old, weight-92.1kg, Crea-1.36 on 03/30/22, last seen by Finis Bud, NP on 03/30/2022, Diagnosis-DVT, CrCl- 62.08 mL/min; Dose is appropriate based on dosing criteria. Will send in refill to requested pharmacy.

## 2022-07-28 ENCOUNTER — Ambulatory Visit (INDEPENDENT_AMBULATORY_CARE_PROVIDER_SITE_OTHER): Payer: Medicare Other | Admitting: Physician Assistant

## 2022-07-28 ENCOUNTER — Encounter: Payer: Self-pay | Admitting: Physician Assistant

## 2022-07-28 ENCOUNTER — Telehealth: Payer: Self-pay

## 2022-07-28 VITALS — BP 140/80 | HR 70 | Ht 72.0 in | Wt 211.0 lb

## 2022-07-28 DIAGNOSIS — Z7901 Long term (current) use of anticoagulants: Secondary | ICD-10-CM | POA: Diagnosis not present

## 2022-07-28 DIAGNOSIS — Z86010 Personal history of colonic polyps: Secondary | ICD-10-CM | POA: Diagnosis not present

## 2022-07-28 NOTE — Progress Notes (Signed)
Subjective:    Patient ID: Patrick Sosa, male    DOB: [birth year], 77 y.o.   MRN: [id number]  HPI  Patrick Sosa is a pleasant 77 year old white male, established with Dr. Carlean Purl who comes in to discuss follow-up colonoscopy in setting of chronic anticoagulation with Xarelto. He was last seen in 2020 and underwent colonoscopy in February 2020 for routine follow-up.  He had 112 mm polyp in the ascending colon removed and a 1 mm polyp in the cecum removed.  Both polyps were tubular adenomas and repeat colonoscopy was recommended at a 3-year interval. Patient says he has been doing well over the past couple of years, he has not had any cardiac or thrombotic events.  He continues on Xarelto for history of factor V deficiency and prior DVTs. He does have history of prostate cancer and had undergone a prostatectomy in April 2023 and is on surveillance at this time with PSA of 0 at last check.  He also has history of hypertension, PVCs, right bundle branch block, chronic kidney disease stage IIIb and polycythemia.  He has no GI complaints currently.  No difficulty with abdominal pain, constipation or diarrhea, no melena or hematochezia.  Review of Systems Pertinent positive and negative review of systems were noted in the above HPI section.  All other review of systems was otherwise negative.   Outpatient Encounter Medications as of 07/28/2022  Medication Sig   amLODipine (NORVASC) 10 MG tablet Take 1 tablet (10 mg total) by mouth daily.   dimenhyDRINATE (DRAMAMINE) 50 MG tablet Take 25 mg by mouth at bedtime as needed. As needed for sleep.   metoprolol succinate (TOPROL XL) 25 MG 24 hr tablet Take 1 tablet (25 mg total) by mouth daily.   pravastatin (PRAVACHOL) 20 MG tablet Take 1 tablet (20 mg total) by mouth daily.   rivaroxaban (XARELTO) 20 MG TABS tablet Take 1 tablet (20 mg total) by mouth daily with supper.   spironolactone (ALDACTONE) 25 MG tablet Take 0.5 tablets (12.5 mg total) by mouth daily.    tadalafil (CIALIS) 5 MG tablet Take 5 mg by mouth daily. Take 3 times per week.   valsartan (DIOVAN) 80 MG tablet Take 1 tablet (80 mg total) by mouth daily.   [DISCONTINUED] alfuzosin (UROXATRAL) 10 MG 24 hr tablet Take 10 mg by mouth daily with breakfast. (Patient not taking: Reported on 06/15/2022)   No facility-administered encounter medications on file as of 07/28/2022.   No Known Allergies Patient Active Problem List   Diagnosis Date Noted   PVC's (premature ventricular contractions); Hx of bigeminy 03/30/2022   Stage 3a chronic kidney disease (CKD) ([place]) 03/30/2022   RBBB 03/30/2022   Hx of adenomatous colonic polyps 08/20/2018   BPH (benign prostatic hyperplasia) 04/10/2014   Benign hypertensive heart disease without heart failure 08/27/2013   Polycythemia    DVT (deep venous thrombosis) (HCC)    Hypercholesterolemia    ONYCHOMYCOSIS 06/03/2010   Factor V deficiency ([place]); Hx of DVT (on long term anticoagulation) 08/20/2009   Essential hypertension 08/23/2007   Social History   Socioeconomic History   Marital status: Married    Spouse name: Not on file   Number of children: 3   Years of education: Not on file   Highest education level: Not on file  Occupational History   Occupation: retired  Tobacco Use   Smoking status: Former    Types: Cigarettes    Quit date: 10/11/1981    Years since quitting: 40.8   Smokeless  tobacco: Never  Vaping Use   Vaping Use: Never used  Substance and Sexual Activity   Alcohol use: No    Alcohol/week: 0.0 standard drinks of alcohol    Comment: rarely   Drug use: No   Sexual activity: Yes  Other Topics Concern   Not on file  Social History Narrative   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, maintenance (IT)    Married    Three children    Six grandchildren       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 Strain: Low Risk  (06/15/2022)   Overall Financial Resource Strain (CARDIA)    Difficulty of Paying Living  Expenses: Not hard at all  Food Insecurity: No Food Insecurity (06/15/2022)   Hunger Vital Sign    Worried About Running Out of Food in the Last Year: Never true    [place] in the Last Year: Never true  Transportation Needs: No Transportation Needs (06/15/2022)   PRAPARE - Hydrologist (Medical): No    Lack of Transportation (Non-Medical): No  Physical Activity: Sufficiently Active (06/15/2022)   Exercise Vital Sign    Days of Exercise per Week: 5 days    Minutes of Exercise per Session: 30 min  Stress: No Stress Concern Present (06/15/2022)   [place]    Feeling of Stress : Not at all  Social Connections: [place] (06/15/2022)   Social Connection and Isolation Panel [NHANES]    Frequency of Communication with Friends and Family: More than three times a week    Frequency of Social Gatherings with Friends and Family: More than three times a week    Attends Religious Services: More than 4 times per year    Active Member of [employer] or Organizations: Yes    Attends Archivist Meetings: More than 4 times per year    Marital Status: Married  Human resources officer Violence: Not At Risk (06/15/2022)   Humiliation, Afraid, Rape, and Kick questionnaire    Fear of Current or Ex-Partner: No    Emotionally Abused: No    Physically Abused: No    Sexually Abused: No    Mr. Signorelli family history includes Alzheimer's disease in his mother; Arthritis in his mother and sister; Breast cancer in his sister; Heart attack in his father; Hypertension in his father and sister; Pancreatic cancer in his father; Rheum arthritis in his daughter.      Objective:    Vitals:   07/28/22 1422 07/28/22 1526  BP: (!) 150/80 (!) 140/80  Pulse: 70     Physical Exam Well-developed well-nourished older WM  in no acute distress.  Height, Weight,211 BMI 28.6  HEENT; nontraumatic normocephalic,  EOMI, PE R LA, sclera anicteric. Oropharynx;not examined today  Neck; supple, no JVD Cardiovascular; regular rate and rhythm with S1-S2, no murmur rub or gallop Pulmonary; Clear bilaterally Abdomen; soft, nontender, nondistended, no palpable mass or hepatosplenomegaly, bowel sounds are active Rectal;not done today Skin; benign exam, no jaundice rash or appreciable lesions Extremities; no clubbing cyanosis or edema skin warm and dry Neuro/Psych; alert and oriented x4, grossly nonfocal mood and affect appropriate        Assessment & Plan:   #44 77 year old white male with history of adenomatous colon polyps.  Last colonoscopy February 2020, with one 12 mm adenoma and one 1 mm adenoma and indicated for  3-year interval follow-up. Currently asymptomatic #2 chronic anticoagulation with Xarelto #3 history of factor V deficiency and prior DVTs #4 polycythemia #5.  Chronic kidney disease 3B #6.  Hypertension #7.  History of prostate cancer status post prostatectomy April 2023 #7 history of PVCs and right bundle branch block  Plan; Patient will be scheduled for colonoscopy with Dr. Carlean Purl in the [facility].  Procedure was discussed in detail with patient including indications risk and benefits and he is agreeable to proceed. He will need to hold Xarelto for 1 day prior to procedure, and we will communicate with his cardiologist who prescribes the Xarelto Dr. Gwyndolyn Kaufman to assure this is reasonable for this patient. Would not anticipate further colonoscopies for surveillance after this due to age    Alfredia Ferguson PA-C 07/28/2022   Cc: Dorothyann Peng, NP

## 2022-07-28 NOTE — Patient Instructions (Addendum)
If you are age 75 or older, your body mass index should be between 23-30. Your Body mass index is 28.62 kg/m. If this is out of the aforementioned range listed, please consider follow up with your Primary Care Provider.  If you are age 52 or younger, your body mass index should be between 19-25. Your Body mass index is 28.62 kg/m. If this is out of the aformentioned range listed, please consider follow up with your Primary Care Provider.   ________________________________________________________   Patrick Sosa have been scheduled for a colonoscopy. Please follow written instructions given to you at your visit today.  Please pick up your prep supplies at the pharmacy within the next 1-3 days. If you use inhalers (even only as needed), please bring them with you on the day of your procedure.  You will be contacted by our office prior to your procedure for directions on holding your Xarelto.  If you do not hear from our office 1 week prior to your scheduled procedure, please call [phone] to discuss.   Thank you for entrusting me with your care and for choosing [facility], Amy Genia Harold, P.A.- C.

## 2022-07-28 NOTE — Telephone Encounter (Signed)
   Patrick Sosa Mar 09, 1946 [id number]  Dear Dr. Johney Frame:  We have scheduled the above named patient for a(n) colonoscopy procedure. Our records show that (s)he is on anticoagulation therapy.  Please advise as to whether the patient may come off their therapy of Xarelto 1 days prior to their procedure which is scheduled for 08-19-22.  Please route your response to Lemar Lofty, CMA or fax response to [phone].  Sincerely,    [facility]

## 2022-07-29 NOTE — Telephone Encounter (Signed)
   Patient Name: Patrick Sosa  DOB: 1945/10/22 MRN: [id number]  Primary Cardiologist: Freada Bergeron, MD  Clinical pharmacists have reviewed the patient's past medical history, labs, and current medications as part of preoperative protocol coverage. The following recommendations have been made:   Per office protocol, patient can hold Xarelto for 1 day prior to procedure. He should resume as soon as safely possible after given hx of factor V leiden deficiency.    I will route this recommendation to the requesting party via Epic fax function and remove from pre-op pool.  Please call with questions.  Mable Fill, Marissa Nestle, NP 07/29/2022, 8:04 AM

## 2022-07-29 NOTE — Telephone Encounter (Signed)
Called and spoke to patient. He understands to hold Xarelto on 2-28 for procedure on 2-29

## 2022-07-29 NOTE — Telephone Encounter (Signed)
Patient with diagnosis of DVT in 2014 and factor V leiden deficiency on Xarelto for anticoagulation.    Procedure: colonoscopy Date of procedure: 08/19/22  CrCl 77m/min Platelet count 158K  Per office protocol, patient can hold Xarelto for 1 day prior to procedure. He should resume as soon as safely possible after given hx of factor V leiden deficiency.  **This guidance is not considered finalized until pre-operative APP has relayed final recommendations.**

## 2022-08-11 ENCOUNTER — Encounter: Payer: Self-pay | Admitting: Internal Medicine

## 2022-08-19 ENCOUNTER — Encounter: Payer: Self-pay | Admitting: Internal Medicine

## 2022-08-19 ENCOUNTER — Ambulatory Visit (AMBULATORY_SURGERY_CENTER): Payer: Medicare Other | Admitting: Internal Medicine

## 2022-08-19 VITALS — BP 151/79 | HR 68 | Temp 97.8°F | Resp 17 | Ht 72.0 in | Wt 211.0 lb

## 2022-08-19 DIAGNOSIS — D122 Benign neoplasm of ascending colon: Secondary | ICD-10-CM

## 2022-08-19 DIAGNOSIS — K635 Polyp of colon: Secondary | ICD-10-CM | POA: Diagnosis not present

## 2022-08-19 DIAGNOSIS — D124 Benign neoplasm of descending colon: Secondary | ICD-10-CM | POA: Diagnosis not present

## 2022-08-19 DIAGNOSIS — Z86010 Personal history of colonic polyps: Secondary | ICD-10-CM

## 2022-08-19 DIAGNOSIS — Z09 Encounter for follow-up examination after completed treatment for conditions other than malignant neoplasm: Secondary | ICD-10-CM | POA: Diagnosis not present

## 2022-08-19 MED ORDER — SODIUM CHLORIDE 0.9 % IV SOLN: 500.0000 mL | Freq: Once | INTRAVENOUS | Status: DC

## 2022-08-19 NOTE — Op Note (Signed)
[place] Patient Name: Patrick Sosa Procedure Date: 08/19/2022 10:01 AM MRN: [id number] Endoscopist: Gatha Mayer , MD, [id number] Age: 77 Referring MD:  Date of Birth: 08-15-1945 Gender: Male Account #: [id number] Procedure:                Colonoscopy Indications:              Surveillance: Personal history of adenomatous                            polyps on last colonoscopy > 3 years ago, Last                            colonoscopy: 2020 Medicines:                Monitored Anesthesia Care Procedure:                Pre-Anesthesia Assessment:                           - Prior to the procedure, a History and Physical                            was performed, and patient medications and                            allergies were reviewed. The patient's tolerance of                            previous anesthesia was also reviewed. The risks                            and benefits of the procedure and the sedation                            options and risks were discussed with the patient.                            All questions were answered, and informed consent                            was obtained. Prior Anticoagulants: The patient has                            taken no anticoagulant or antiplatelet agents. ASA                            Grade Assessment: III - A patient with severe                            systemic disease. After reviewing the risks and                            benefits, the patient was deemed in satisfactory  condition to undergo the procedure.                           After obtaining informed consent, the colonoscope                            was passed under direct vision. Throughout the                            procedure, the patient's blood pressure, pulse, and                            oxygen saturations were monitored continuously. The                            Olympus SN [id number] was introduced through the  anus                            and advanced to the the cecum, identified by                            appendiceal orifice and ileocecal valve. The                            colonoscopy was performed without difficulty. The                            patient tolerated the procedure well. The bowel                            preparation used was Miralax via split dose                            instruction. The ileocecal valve, appendiceal                            orifice, and rectum were photographed. The quality                            of the bowel preparation was good. Scope In: 10:08:50 AM Scope Out: 10:29:12 AM Scope Withdrawal Time: 0 hours 12 minutes 13 seconds  Total Procedure Duration: 0 hours 20 minutes 22 seconds  Findings:                 The digital exam findings include surgically absent                            prostate. Pertinent negatives include no palpable                            rectal lesions.                           Multiple diverticula were found in the entire colon.  Two sessile polyps were found in the descending                            colon and ascending colon. The polyps were                            diminutive in size. These polyps were removed with                            a cold snare. Resection and retrieval were                            complete. Verification of patient identification                            for the specimen was done. Estimated blood loss was                            minimal.                           The exam was otherwise without abnormality on                            direct and retroflexion views. Complications:            No immediate complications. Estimated Blood Loss:     Estimated blood loss was minimal. Estimated blood                            loss was minimal. Impression:               - A surgically absent prostate found on digital                            exam.                            - Diverticulosis in the entire examined colon.                           - Two diminutive polyps in the descending colon and                            in the ascending colon, removed with a cold snare.                            Resected and retrieved.                           - The examination was otherwise normal on direct                            and retroflexion views.                           -  Personal history of colonic polyps. 2020 - 2                            adenomas largest 12 mm Recommendation:           - Patient has a contact number available for                            emergencies. The signs and symptoms of potential                            delayed complications were discussed with the                            patient. Return to normal activities tomorrow.                            Written discharge instructions were provided to the                            patient.                           - Resume previous diet.                           - Continue present medications.                           - Resume Xarelto (rivaroxaban) at prior dose                            tomorrow.                           - No recommendation at this time regarding repeat                            colonoscopy due to age.                           - Await pathology results. Gatha Mayer, MD 08/19/2022 10:37:57 AM This report has been signed electronically.

## 2022-08-19 NOTE — Progress Notes (Signed)
Uneventful anesthetic. Report to pacu rn. Vss. Care resumed by rn.

## 2022-08-19 NOTE — Progress Notes (Signed)
History and Physical Interval Note:  08/19/2022 10:00 AM  Patrick Sosa  has presented today for endoscopic procedure(s), with the diagnosis of  Encounter Diagnosis  Name Primary?   Hx of adenomatous colonic polyps Yes  .  The various methods of evaluation and treatment have been discussed with the patient and/or family. After consideration of risks, benefits and other options for treatment, the patient has consented to  the endoscopic procedure(s).   The patient's history has been reviewed, patient examined, no change in status, stable for endoscopic procedure(s).  I have reviewed the patient's chart and labs.  Questions were answered to the patient's satisfaction.     Gatha Mayer, MD, [facility]

## 2022-08-19 NOTE — Patient Instructions (Addendum)
I found and removed 2 tiny polyps that look benign.  You still have diverticulosis - thickened muscle rings and pouches in the colon wall. Please read the handout about this condition.  Not sure you need another routine colonoscopy based upon your age, findings and history. Will decide once I review pathology.  Restart Xarelto tomorrow.  I appreciate the opportunity to care for you. Gatha Mayer, MD, [facility]   Handout on polyps and diverticulosis given to patient. Await pathology results. Resume previous diet and continue present medications.     YOU HAD AN ENDOSCOPIC PROCEDURE TODAY AT [facility]:   Refer to the procedure report that was given to you for any specific questions about what was found during the examination.  If the procedure report does not answer your questions, please call your gastroenterologist to clarify.  If you requested that your care partner not be given the details of your procedure findings, then the procedure report has been included in a sealed envelope for you to review at your convenience later.  YOU SHOULD EXPECT: Some feelings of bloating in the abdomen. Passage of more gas than usual.  Walking can help get rid of the air that was put into your GI tract during the procedure and reduce the bloating. If you had a lower endoscopy (such as a colonoscopy or flexible sigmoidoscopy) you may notice spotting of blood in your stool or on the toilet paper. If you underwent a bowel prep for your procedure, you may not have a normal bowel movement for a few days.  Please Note:  You might notice some irritation and congestion in your nose or some drainage.  This is from the oxygen used during your procedure.  There is no need for concern and it should clear up in a day or so.  SYMPTOMS TO REPORT IMMEDIATELY:  Following lower endoscopy (colonoscopy or flexible sigmoidoscopy):  Excessive amounts of blood in the stool  Significant tenderness or worsening of  abdominal pains  Swelling of the abdomen that is new, acute  Fever of 100F or higher  For urgent or emergent issues, a gastroenterologist can be reached at any hour by calling [phone]. Do not use MyChart messaging for urgent concerns.    DIET:  We do recommend a small meal at first, but then you may proceed to your regular diet.  Drink plenty of fluids but you should avoid alcoholic beverages for 24 hours.  ACTIVITY:  You should plan to take it easy for the rest of today and you should NOT DRIVE or use heavy machinery until tomorrow (because of the sedation medicines used during the test).    FOLLOW UP: Our staff will call the number listed on your records the next business day following your procedure.  We will call around 7:15- 8:00 am to check on you and address any questions or concerns that you may have regarding the information given to you following your procedure. If we do not reach you, we will leave a message.     If any biopsies were taken you will be contacted by phone or by letter within the next 1-3 weeks.  Please call us at [phone] if you have not heard about the biopsies in 3 weeks.    SIGNATURES/CONFIDENTIALITY: You and/or your care partner have signed paperwork which will be entered into your electronic medical record.  These signatures attest to the fact that that the information above on your After Visit Summary has been  reviewed and is understood.  Full responsibility of the confidentiality of this discharge information lies with you and/or your care-partner.

## 2022-08-20 ENCOUNTER — Other Ambulatory Visit: Payer: Self-pay

## 2022-08-20 ENCOUNTER — Telehealth: Payer: Self-pay | Admitting: *Deleted

## 2022-08-20 MED ORDER — AMLODIPINE BESYLATE 10 MG PO TABS: 10.0000 mg | ORAL_TABLET | Freq: Every day | ORAL | 2 refills | Status: DC

## 2022-08-20 MED ORDER — METOPROLOL SUCCINATE ER 25 MG PO TB24: 25.0000 mg | ORAL_TABLET | Freq: Every day | ORAL | 2 refills | Status: DC

## 2022-08-20 NOTE — Addendum Note (Signed)
Addended by: Carter Kitten D on: 08/20/2022 10:34 AM   Modules accepted: Orders

## 2022-08-20 NOTE — Telephone Encounter (Signed)
  Follow up Call-     08/19/2022    8:58 AM  Call back number  Post procedure Call Back phone  # [phone]  Permission to leave phone message Yes     Patient questions:  Do you have a fever, pain , or abdominal swelling? No. Pain Score  0 *  Have you tolerated food without any problems? Yes.    Have you been able to return to your normal activities? Yes.    Do you have any questions about your discharge instructions: Diet   No. Medications  No. Follow up visit  No.  Do you have questions or concerns about your Care? No.  Actions: * If pain score is 4 or above: No action needed, pain <4.

## 2022-08-31 ENCOUNTER — Other Ambulatory Visit: Payer: Self-pay

## 2022-08-31 DIAGNOSIS — Z79899 Other long term (current) drug therapy: Secondary | ICD-10-CM

## 2022-08-31 DIAGNOSIS — I119 Hypertensive heart disease without heart failure: Secondary | ICD-10-CM

## 2022-08-31 MED ORDER — VALSARTAN 80 MG PO TABS: 80.0000 mg | ORAL_TABLET | Freq: Every day | ORAL | 1 refills | Status: DC

## 2022-09-02 ENCOUNTER — Encounter: Payer: Self-pay | Admitting: Internal Medicine

## 2022-09-13 NOTE — Progress Notes (Signed)
Cardiology Office Note:    Date:  09/17/2022   ID:  Patrick Sosa, DOB 07/27/1945, MRN [id number]  PCP:  Dorothyann Peng, NP   [place]  Cardiologist:  Freada Bergeron, MD  Advanced Practice Provider:  No care team member to display Electrophysiologist:  None   Referring MD: Dorothyann Peng, NP    History of Present Illness:    Patrick Sosa is a 77 y.o. 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  Last seen in 08/2021 where he was planned for prostatectomy for prostate cancer. Was otherwise doing well from a CV standpoint. Amlodipine was increased at that time.  Today, the patient feels well. No chest pain, SOB, orthopnea or PND. Blood pressure is running mainly 110-130s at home. Tolerating medications as prescribed. No bleeding issues on xarelto.    Past Medical History:  Diagnosis Date   Chronic anticoagulation    Diverticulosis    DVT (deep venous thrombosis) (HCC)    Factor V deficiency (HCC)    Hx of adenomatous colonic polyps 08/20/2018   Hypercholesterolemia    Hypertension    Kidney disease    Kidney stones    Pneumonia    Polycythemia    Prostate cancer [facility])     Past Surgical History:  Procedure Laterality Date   CATARACT EXTRACTION, BILATERAL Bilateral 2018   COLONOSCOPY     ROBOT ASSISTED LAPAROSCOPIC RADICAL PROSTATECTOMY  10/06/2021   TONSILLECTOMY      Current Medications: Current Meds  Medication Sig   amLODipine (NORVASC) 10 MG tablet Take 1 tablet (10 mg total) by mouth daily.   dimenhyDRINATE (DRAMAMINE) 50 MG tablet Take 25 mg by mouth at bedtime as needed. As needed for sleep.   metoprolol succinate (TOPROL XL) 25 MG 24 hr tablet Take 1 tablet (25 mg total) by mouth daily.   pravastatin (PRAVACHOL) 20 MG tablet Take 1 tablet (20 mg total) by mouth daily.   rivaroxaban  (XARELTO) 20 MG TABS tablet Take 1 tablet (20 mg total) by mouth daily with supper.   spironolactone (ALDACTONE) 25 MG tablet Take 0.5 tablets (12.5 mg total) by mouth daily.   tadalafil (CIALIS) 5 MG tablet Take 5 mg by mouth daily. Take 3 times per week.   valsartan (DIOVAN) 80 MG tablet Take 1 tablet (80 mg total) by mouth daily.     Allergies:   Patient has no known allergies.   Social History   Socioeconomic History   Marital status: Married    Spouse name: Not on file   Number of children: 3   Years of education: Not on file   Highest education level: Not on file  Occupational History   Occupation: retired  Tobacco Use   Smoking status: Former    Types: Cigarettes    Quit date: 10/11/1981    Years since quitting: 40.9   Smokeless tobacco: Never  Vaping Use   Vaping Use: Never used  Substance and Sexual Activity   Alcohol use: No    Alcohol/week: 0.0 standard drinks of alcohol    Comment: rarely   Drug use: No   Sexual activity: Yes  Other Topics Concern   Not on file  Social History Narrative   Retired    - Engineer, maintenance (IT)    Married    Three children    Six  grandchildren       He is active at [employer]     Social Determinants of Health   Financial Resource Strain: Low Risk  (06/15/2022)   Overall Financial Resource Strain (CARDIA)    Difficulty of Paying Living Expenses: Not hard at all  Food Insecurity: No Food Insecurity (06/15/2022)   Hunger Vital Sign    Worried About Running Out of Food in the Last Year: Never true    Ran Out of Food in the Last Year: Never true  Transportation Needs: No Transportation Needs (06/15/2022)   PRAPARE - Hydrologist (Medical): No    Lack of Transportation (Non-Medical): No  Physical Activity: Sufficiently Active (06/15/2022)   Exercise Vital Sign    Days of Exercise per Week: 5 days    Minutes of Exercise per Session: 30 min  Stress: No Stress Concern Present (06/15/2022)   [place]    Feeling of Stress : Not at all  Social Connections: [place] (06/15/2022)   Social Connection and Isolation Panel [NHANES]    Frequency of Communication with Friends and Family: More than three times a week    Frequency of Social Gatherings with Friends and Family: More than three times a week    Attends Religious Services: More than 4 times per year    Active Member of [employer] or Organizations: Yes    Attends Music therapist: More than 4 times per year    Marital Status: Married     Family History: The patient's family history includes Alzheimer's disease in his mother; Arthritis in his mother and sister; Breast cancer in his sister; Heart attack in his father; Hypertension in his father and sister; Pancreatic cancer in his father; Rheum arthritis in his daughter. There is no history of Colon cancer, Stomach cancer, Rectal cancer, Liver cancer, or Esophageal cancer.  ROS:   Please see the history of present illness.    Review of Systems  Constitutional:  Negative for chills and fever.  HENT:  Negative for hearing loss.   Eyes:  Negative for blurred vision and redness.  Respiratory:  Negative for shortness of breath.   Cardiovascular:  Negative for chest pain, palpitations, orthopnea, claudication, leg swelling and PND.  Gastrointestinal:  Negative for blood in stool, melena, nausea and vomiting.  Genitourinary:  Negative for flank pain and hematuria.  Musculoskeletal:  Negative for falls and myalgias.  Neurological:  Positive for tingling (Bilateral toes). Negative for dizziness and loss of consciousness.  Endo/Heme/Allergies:  Negative for polydipsia.  Psychiatric/Behavioral:  Negative for substance abuse.     EKGs/Labs/Other Studies Reviewed:    The following studies were reviewed today: Lower Venous Duplex 12/23/12: Summary:   - Findings consistent with deep vein thrombosis involving    the left  posterial tibial vein, left peroneal vein, and    left gastrocnemius vein.  - Findings consistent with superficial vein thrombosis    involving the left greater saphenous vein and left small    saphenous vein.    EKG:  No new tracing  Recent Labs: 03/30/2022: BUN 25; Creatinine, Ser 1.36; Hemoglobin 15.0; Magnesium 2.1; Platelets 158; Potassium 4.1; Sodium 138; TSH 3.040  Recent Lipid Panel    Component Value Date/Time   CHOL 107 09/07/2021 0951   TRIG 111 09/07/2021 0951   HDL 30 (L) 09/07/2021 0951   CHOLHDL 3.6 09/07/2021 0951   CHOLHDL 4 08/30/2019 0713   VLDL 24.2  08/30/2019 0713   LDLCALC 56 09/07/2021 0951   LDLDIRECT 65.4 02/23/2012 1049     Risk Assessment/Calculations:       Physical Exam:    VS:  BP (!) 140/78   Pulse 73   Ht 6' (1.829 m)   Wt 210 lb 3.2 oz (95.3 kg)   SpO2 97%   BMI 28.51 kg/m     Wt Readings from Last 3 Encounters:  09/17/22 210 lb 3.2 oz (95.3 kg)  08/19/22 211 lb (95.7 kg)  07/28/22 211 lb (95.7 kg)     GEN:  Well nourished, well developed in no acute distress HEENT: Normal NECK: No JVD; No carotid bruits CARDIAC: RRR, no murmurs, rubs, gallops RESPIRATORY:  Clear to auscultation without rales, wheezing or rhonchi  ABDOMEN: Soft, non-tender, non-distended MUSCULOSKELETAL:  No edema; No deformity  SKIN: Warm and dry NEUROLOGIC:  Alert and oriented x 3 PSYCHIATRIC:  Normal affect   ASSESSMENT:    1. Essential hypertension   2. Factor V deficiency ([place])   3. PVC (premature ventricular contraction)   4. Current use of long term anticoagulation   5. Stage 3a chronic kidney disease (CKD) ([place])   6. Hypercholesterolemia   7. History of DVT (deep vein thrombosis)     PLAN:    In order of problems listed above:  #HTN: Well controlled at home running mainly <130/90. -Continue amlodipine 10mg  daily -Continue spiro 12.5mg  daily -Continue valsartan 80mg  daily -Continue metop 25mg  XL daily  #Factor V Leiden Deficiency  with history of DVT: #Chronic AC: Tolerating AC. Will need bridge if xarelto held >48 hours prior to possible prostatectomy -Continue lifelong xarelto  #HLD: Well controlled with last LDL 56 on 08/2021. -Continue pravastatin 20mg  daily -Check lipids today  #Remote History of PVC: Symptoms well controlled. -Continue metop 25mg  XL daily  #Prostate Cancer: -Follows at [facility]   Medication Adjustments/Labs and Tests Ordered: Current medicines are reviewed at length with the patient today.  Concerns regarding medicines are outlined above.  No orders of the defined types were placed in this encounter.  No orders of the defined types were placed in this encounter.   There are no Patient Instructions on file for this visit.   Signed, Freada Bergeron, MD  09/17/2022 8:15 AM    [place]

## 2022-09-17 ENCOUNTER — Encounter: Payer: Self-pay | Admitting: Cardiology

## 2022-09-17 ENCOUNTER — Ambulatory Visit: Payer: Medicare Other | Attending: Cardiology | Admitting: Cardiology

## 2022-09-17 VITALS — BP 140/78 | HR 73 | Ht 72.0 in | Wt 210.2 lb

## 2022-09-17 DIAGNOSIS — Z7901 Long term (current) use of anticoagulants: Secondary | ICD-10-CM

## 2022-09-17 DIAGNOSIS — N1831 Chronic kidney disease, stage 3a: Secondary | ICD-10-CM

## 2022-09-17 DIAGNOSIS — I493 Ventricular premature depolarization: Secondary | ICD-10-CM

## 2022-09-17 DIAGNOSIS — D682 Hereditary deficiency of other clotting factors: Secondary | ICD-10-CM | POA: Diagnosis not present

## 2022-09-17 DIAGNOSIS — I10 Essential (primary) hypertension: Secondary | ICD-10-CM | POA: Diagnosis not present

## 2022-09-17 DIAGNOSIS — Z86718 Personal history of other venous thrombosis and embolism: Secondary | ICD-10-CM

## 2022-09-17 DIAGNOSIS — E7800 Pure hypercholesterolemia, unspecified: Secondary | ICD-10-CM

## 2022-09-17 NOTE — Patient Instructions (Signed)
Medication Instructions:  Your physician recommends that you continue on your current medications as directed. Please refer to the Current Medication list given to you today.  *If you need a refill on your cardiac medications before your next appointment, please call your pharmacy*   Lab Work: CBC, Lipids, AIC If you have labs (blood work) drawn today and your tests are completely normal, you will receive your results only by: [place] (if you have MyChart) OR A paper copy in the mail If you have any lab test that is abnormal or we need to change your treatment, we will call you to review the results.    Follow-Up: At [facility], you and your health needs are our priority.  As part of our continuing mission to provide you with exceptional heart care, we have created designated Provider Care Teams.  These Care Teams include your primary Cardiologist (physician) and Advanced Practice Providers (APPs -  Physician Assistants and Nurse Practitioners) who all work together to provide you with the care you need, when you need it.  We recommend signing up for the patient portal called "MyChart".  Sign up information is provided on this After Visit Summary.  MyChart is used to connect with patients for Virtual Visits (Telemedicine).  Patients are able to view lab/test results, encounter notes, upcoming appointments, etc.  Non-urgent messages can be sent to your provider as well.   To learn more about what you can do with MyChart, go to [email or web address].    Your next appointment:   1 year(s)  Provider:   Freada Bergeron, MD

## 2022-09-18 LAB — CBC
Hematocrit: 44.1 % (ref 37.5–51.0)
Hemoglobin: 15.8 g/dL (ref 13.0–17.7)
MCH: 36.1 pg — ABNORMAL HIGH (ref 26.6–33.0)
MCHC: 35.8 g/dL — ABNORMAL HIGH (ref 31.5–35.7)
MCV: 101 fL — ABNORMAL HIGH (ref 79–97)
Platelets: 191 10*3/uL (ref 150–450)
RBC: 4.38 x10E6/uL (ref 4.14–5.80)
RDW: 11.6 % (ref 11.6–15.4)
WBC: 6.3 10*3/uL (ref 3.4–10.8)

## 2022-09-18 LAB — LIPID PANEL
Chol/HDL Ratio: 4.8 ratio (ref 0.0–5.0)
Cholesterol, Total: 133 mg/dL (ref 100–199)
HDL: 28 mg/dL — ABNORMAL LOW (ref >39)
LDL Chol Calc (NIH): 75 mg/dL (ref 0–99)
Triglycerides: 174 mg/dL — ABNORMAL HIGH (ref 0–149)
VLDL Cholesterol Cal: 30 mg/dL (ref 5–40)

## 2022-09-18 LAB — HEMOGLOBIN A1C
Est. average glucose Bld gHb Est-mCnc: 103 mg/dL
Hgb A1c MFr Bld: 5.2 % (ref 4.8–5.6)

## 2022-10-06 ENCOUNTER — Other Ambulatory Visit: Payer: Self-pay

## 2022-10-06 MED ORDER — SPIRONOLACTONE 25 MG PO TABS: 12.5000 mg | ORAL_TABLET | Freq: Every day | ORAL | 3 refills | Status: DC

## 2022-10-06 MED ORDER — PRAVASTATIN SODIUM 20 MG PO TABS: 20.0000 mg | ORAL_TABLET | Freq: Every day | ORAL | 3 refills | Status: DC

## 2022-10-06 NOTE — Addendum Note (Signed)
Addended by: Margaret Pyle D on: 10/06/2022 08:56 AM   Modules accepted: Orders

## 2022-10-20 ENCOUNTER — Ambulatory Visit (INDEPENDENT_AMBULATORY_CARE_PROVIDER_SITE_OTHER): Payer: Medicare Other

## 2022-10-20 ENCOUNTER — Ambulatory Visit (INDEPENDENT_AMBULATORY_CARE_PROVIDER_SITE_OTHER): Payer: Medicare Other | Admitting: Podiatry

## 2022-10-20 DIAGNOSIS — M79674 Pain in right toe(s): Secondary | ICD-10-CM

## 2022-10-20 DIAGNOSIS — M7752 Other enthesopathy of left foot: Secondary | ICD-10-CM

## 2022-10-20 DIAGNOSIS — D689 Coagulation defect, unspecified: Secondary | ICD-10-CM | POA: Diagnosis not present

## 2022-10-20 DIAGNOSIS — M722 Plantar fascial fibromatosis: Secondary | ICD-10-CM

## 2022-10-20 DIAGNOSIS — M79675 Pain in left toe(s): Secondary | ICD-10-CM

## 2022-10-20 DIAGNOSIS — B351 Tinea unguium: Secondary | ICD-10-CM | POA: Diagnosis not present

## 2022-10-20 NOTE — Progress Notes (Signed)
Subjective:   Patient ID: Patrick Sosa, male   DOB: 77 y.o.   MRN: [id number]   HPI Patient presents stating he is got a lot of pain around his lesser MPJ joints bilateral left over right and has digital deformities history of orthotics but they are 77 years old no longer supporting his feet.  Patient also has nails that are incurvated with the patient on blood thinner   ROS      Objective:  Physical Exam  Neurovascular status was found to be intact inflammation fluid around the lesser MPJs left over right mostly around the second and third MPJ.  Patient has prominent bone structure secondary to diminished fat pad and digital compression with elongated nailbeds 1-5 both feet incurvated and gets sore with pressure     Assessment:  Inflammatory capsulitis lesser MPJs left with diminished fat pad along with mycotic nail infection 1-5 both feet and cannot rule out neuropathic symptomatology     Plan:  H&P all conditions reviewed I recommended new orthotics and patient is casted for functional orthotic devices to offload weight off the metatarsals and try to reduce his stress.  I did do sterile prep injected the second and third MPJ left 3 mg Dexasone Kenalog 5 mg Xylocaine debrided nailbeds 1-5 both feet no angiogenic bleeding reappoint routine care  X-rays indicate there is moderate elevation of the lesser digits compression against the MPJ is noted

## 2022-11-18 ENCOUNTER — Ambulatory Visit (INDEPENDENT_AMBULATORY_CARE_PROVIDER_SITE_OTHER): Payer: Medicare Other | Admitting: Family Medicine

## 2022-11-18 ENCOUNTER — Encounter: Payer: Self-pay | Admitting: Family Medicine

## 2022-11-18 VITALS — BP 140/82 | HR 79 | Temp 98.9°F | Wt 207.4 lb

## 2022-11-18 DIAGNOSIS — R051 Acute cough: Secondary | ICD-10-CM | POA: Diagnosis not present

## 2022-11-18 DIAGNOSIS — J029 Acute pharyngitis, unspecified: Secondary | ICD-10-CM

## 2022-11-18 DIAGNOSIS — R0982 Postnasal drip: Secondary | ICD-10-CM | POA: Diagnosis not present

## 2022-11-18 DIAGNOSIS — H66002 Acute suppurative otitis media without spontaneous rupture of ear drum, left ear: Secondary | ICD-10-CM

## 2022-11-18 DIAGNOSIS — M2142 Flat foot [pes planus] (acquired), left foot: Secondary | ICD-10-CM

## 2022-11-18 LAB — POC COVID19 BINAXNOW: SARS Coronavirus 2 Ag: NEGATIVE

## 2022-11-18 LAB — POCT RAPID STREP A (OFFICE): Rapid Strep A Screen: NEGATIVE

## 2022-11-18 MED ORDER — AMOXICILLIN-POT CLAVULANATE 500-125 MG PO TABS
1.0000 | ORAL_TABLET | Freq: Two times a day (BID) | ORAL | 0 refills | Status: AC
Start: 2022-11-18 — End: 2022-11-25

## 2022-11-18 NOTE — Progress Notes (Signed)
Established Patient Office Visit   Subjective  Patient ID: Patrick Sosa, male    DOB: May 31, 1946  Age: 77 y.o. MRN: [id number]  Chief Complaint  Patient presents with   Cough    With congestion, sore throat, an headache for about 4 days. Started all of a sudden. Taken mucines, tylenol helped during the day, but wears off at end of day. Has blood in mucus wen blows nose, when takes mucinex cough is productive.     Is a 77 year old male followed by Shirline Frees, NP seen for acute concern.  Patient endorses cough, nasal congestion, facial pressure, headache x 4 days.  Tried Mucinex for symptoms.  Sick contacts include his wife who is sick with similar symptoms.  Patient states he has history of foot problems.  Recently foot discomfort keeping him awake at night.  No pain with walking or standing.  Sees podiatry for history of flat feet.  Wears arch support.  Endorses intermittent ecchymosis of left foot.  Not currently present.  X-rays of left foot 3 months ago were negative.  Cough    Past Medical History:  Diagnosis Date   Chronic anticoagulation    Diverticulosis    DVT (deep venous thrombosis) (HCC)    Factor V deficiency (HCC)    Hx of adenomatous colonic polyps 08/20/2018   Hypercholesterolemia    Hypertension    Kidney disease    Kidney stones    Pneumonia    Polycythemia    Prostate cancer [facility])    Past Surgical History:  Procedure Laterality Date   CATARACT EXTRACTION, BILATERAL Bilateral 2018   COLONOSCOPY     ROBOT ASSISTED LAPAROSCOPIC RADICAL PROSTATECTOMY  10/06/2021   TONSILLECTOMY     Social History   Tobacco Use   Smoking status: Former    Types: Cigarettes    Quit date: 10/11/1981    Years since quitting: 41.1   Smokeless tobacco: Never  Vaping Use   Vaping Use: Never used  Substance Use Topics   Alcohol use: No    Alcohol/week: 0.0 standard drinks of alcohol    Comment: rarely   Drug use: No   Family History  Problem Relation Age of Onset    Alzheimer's disease Mother    Arthritis Mother    Pancreatic cancer Father    Hypertension Father    Heart attack Father    Hypertension Sister    Breast cancer Sister    Arthritis Sister    Rheum arthritis Daughter    Colon cancer Neg Hx    Stomach cancer Neg Hx    Rectal cancer Neg Hx    Liver cancer Neg Hx    Esophageal cancer Neg Hx    No Known Allergies    Review of Systems  Respiratory:  Positive for cough.    Negative unless stated above    Objective:     BP (!) 140/82 (BP Location: Left Arm, Patient Position: Sitting, Cuff Size: Normal)   Pulse 79   Temp 98.9 F (37.2 C) (Oral)   Wt 207 lb 6.4 oz (94.1 kg)   SpO2 95%   BMI 28.13 kg/m    Physical Exam Constitutional:      General: He is not in acute distress.    Appearance: Normal appearance.  HENT:     Head: Normocephalic and atraumatic.     Left Ear: Tympanic membrane is erythematous and bulging.     Ears:     Comments: Hearing aids in place bilaterally.  Left TM full with erythema edema and supra fluid.  Right TM full.    Nose: Nose normal.     Mouth/Throat:     Mouth: Mucous membranes are moist.  Cardiovascular:     Rate and Rhythm: Normal rate and regular rhythm.     Heart sounds: Normal heart sounds. No murmur heard.    No gallop.  Pulmonary:     Effort: Pulmonary effort is normal. No respiratory distress.     Breath sounds: Normal breath sounds. No wheezing, rhonchi or rales.  Skin:    General: Skin is warm and dry.  Neurological:     Mental Status: He is alert and oriented to person, place, and time.      Results for orders placed or performed in visit on 11/18/22  POC COVID-19  Result Value Ref Range   SARS Coronavirus 2 Ag Negative Negative  POC Rapid Strep A  Result Value Ref Range   Rapid Strep A Screen Negative Negative      Assessment & Plan:  Acute suppurative otitis media of left ear without spontaneous rupture of tympanic membrane, recurrence not specified -      Amoxicillin-Pot Clavulanate; Take 1 tablet by mouth in the morning and at bedtime for 7 days.  Dispense: 14 tablet; Refill: 0  Sore throat -     POC COVID-19 BinaxNow -     POCT rapid strep A  Acute cough  Post-nasal drainage  Pes planus of left foot   COVID and strep testing negative.  Start ABX for AOM.  Supportive care.  OTC antihistamine or nasal spray.  Given strict precautions  Foot exercises and arch supports.  Continue follow-up with podiatry.  Return if symptoms worsen or fail to improve.   Deeann Saint, MD

## 2022-11-25 ENCOUNTER — Ambulatory Visit: Payer: Medicare Other | Admitting: Adult Health

## 2022-12-08 ENCOUNTER — Ambulatory Visit (INDEPENDENT_AMBULATORY_CARE_PROVIDER_SITE_OTHER): Payer: Medicare Other | Admitting: Podiatry

## 2022-12-08 DIAGNOSIS — M7752 Other enthesopathy of left foot: Secondary | ICD-10-CM

## 2022-12-08 DIAGNOSIS — M722 Plantar fascial fibromatosis: Secondary | ICD-10-CM

## 2022-12-08 NOTE — Progress Notes (Signed)
Patient presents today to pick up custom molded foot orthotics recommended by Dr. Charlsie Merles.   Orthotics were dispensed and fit was unsatisfactory. Left foot needs arch height raise, right foot was comfortable. Orthotics will be sent back for adjustment to match arch height symmetric.   Charges will be entered once they are received and fit is satisfactory

## 2023-01-04 ENCOUNTER — Other Ambulatory Visit: Payer: Self-pay | Admitting: Podiatry

## 2023-01-04 ENCOUNTER — Other Ambulatory Visit: Payer: Self-pay

## 2023-01-04 ENCOUNTER — Ambulatory Visit: Payer: Medicare Other

## 2023-01-04 DIAGNOSIS — Z7901 Long term (current) use of anticoagulants: Secondary | ICD-10-CM

## 2023-01-04 DIAGNOSIS — D682 Hereditary deficiency of other clotting factors: Secondary | ICD-10-CM

## 2023-01-04 MED ORDER — GABAPENTIN 300 MG PO CAPS: 300.0000 mg | ORAL_CAPSULE | Freq: Three times a day (TID) | ORAL | 3 refills | Status: DC

## 2023-01-04 MED ORDER — RIVAROXABAN 20 MG PO TABS
20.0000 mg | ORAL_TABLET | Freq: Every day | ORAL | 1 refills | Status: DC
Start: 2023-01-04 — End: 2023-07-19

## 2023-01-04 NOTE — Progress Notes (Unsigned)
01/04/23 patient was present and fit with CFO's Orthotics fit well provide total contact to BIL MLA's helping to greater reduce pressure to BIL feet by distributing body weight more evenly across feet   Patient was given wear care and break in instructions and will call office if any problems arise - Addison Bailey Cped, CFo CFm

## 2023-01-04 NOTE — Telephone Encounter (Signed)
Prescription refill request for Xarelto received.  Indication:DVT Last office visit:3/24 Weight:94.1  KG Age:77 Scr:1.36  10/23 CrCl:63.43  ml/min  Prescription refilled

## 2023-02-16 ENCOUNTER — Telehealth: Payer: Self-pay | Admitting: *Deleted

## 2023-02-16 NOTE — Telephone Encounter (Signed)
Please advise holding Xarelto prior to inguinal hernia repair.   Thank you!  DW

## 2023-02-16 NOTE — Telephone Encounter (Signed)
   Pre-operative Risk Assessment    Patient Name: Patrick Sosa  DOB: 01/30/1946 MRN: [id number]      Request for Surgical Clearance    Procedure:   Inguinal Hernia Surgery  Date of Surgery:  Clearance TBD                                 Surgeon:  Dr. Ivar Drape Surgeon's Group or Practice Name:  [facility] Phone number:  [phone] Fax number:  [phone]   Type of Clearance Requested:   - Medical  - Pharmacy:  Hold Rivaroxaban (Xarelto) Not Indicated.    Type of Anesthesia:  General    Additional requests/questions:    Signed, Saurabh Carmona   02/16/2023, 11:00 AM

## 2023-02-18 ENCOUNTER — Other Ambulatory Visit: Payer: Self-pay | Admitting: Medical Genetics

## 2023-02-18 ENCOUNTER — Telehealth: Payer: Self-pay | Admitting: *Deleted

## 2023-02-18 DIAGNOSIS — Z006 Encounter for examination for normal comparison and control in clinical research program: Secondary | ICD-10-CM

## 2023-02-18 NOTE — Telephone Encounter (Signed)
Pt has been scheduled for tele pre op appt 03/01/23 @ 2:20. Med rec and consent are done.      Patient Consent for Virtual Visit        Patrick Sosa has provided verbal consent on 02/18/2023 for a virtual visit (video or telephone).   CONSENT FOR VIRTUAL VISIT FOR:  Patrick Sosa  By participating in this virtual visit I agree to the following:  I hereby voluntarily request, consent and authorize [facility] and its employed or contracted physicians, physician assistants, nurse practitioners or other licensed health care professionals (the Practitioner), to provide me with telemedicine health care services (the "Services") as deemed necessary by the treating Practitioner. I acknowledge and consent to receive the Services by the Practitioner via telemedicine. I understand that the telemedicine visit will involve communicating with the Practitioner through live audiovisual communication technology and the disclosure of certain medical information by electronic transmission. I acknowledge that I have been given the opportunity to request an in-person assessment or other available alternative prior to the telemedicine visit and am voluntarily participating in the telemedicine visit.  I understand that I have the right to withhold or withdraw my consent to the use of telemedicine in the course of my care at any time, without affecting my right to future care or treatment, and that the Practitioner or I may terminate the telemedicine visit at any time. I understand that I have the right to inspect all information obtained and/or recorded in the course of the telemedicine visit and may receive copies of available information for a reasonable fee.  I understand that some of the potential risks of receiving the Services via telemedicine include:  Delay or interruption in medical evaluation due to technological equipment failure or disruption; Information transmitted may not be sufficient (e.g. poor  resolution of images) to allow for appropriate medical decision making by the Practitioner; and/or  In rare instances, security protocols could fail, causing a breach of personal health information.  Furthermore, I acknowledge that it is my responsibility to provide information about my medical history, conditions and care that is complete and accurate to the best of my ability. I acknowledge that Practitioner's advice, recommendations, and/or decision may be based on factors not within their control, such as incomplete or inaccurate data provided by me or distortions of diagnostic images or specimens that may result from electronic transmissions. I understand that the practice of medicine is not an exact science and that Practitioner makes no warranties or guarantees regarding treatment outcomes. I acknowledge that a copy of this consent can be made available to me via my patient portal [facility] MyChart), or I can request a printed copy by calling the office of [facility].    I understand that my insurance will be billed for this visit.   I have read or had this consent read to me. I understand the contents of this consent, which adequately explains the benefits and risks of the Services being provided via telemedicine.  I have been provided ample opportunity to ask questions regarding this consent and the Services and have had my questions answered to my satisfaction. I give my informed consent for the services to be provided through the use of telemedicine in my medical care

## 2023-02-18 NOTE — Telephone Encounter (Signed)
   Name: Patrick Sosa  DOB: 05-29-1946  MRN: [id number]  Primary Cardiologist: Meriam Sprague, MD   Preoperative team, please contact this patient and set up a phone call appointment for further preoperative risk assessment. Please obtain consent and complete medication review. Thank you for your help.  I confirm that guidance regarding antiplatelet and oral anticoagulation therapy has been completed and, if necessary, noted below.  Per office protocol, patient can hold Xarelto for 2 days prior to procedure.     Please resume as soon as safely possible.      Joylene Grapes, NP 02/18/2023, 11:19 AM [facility]

## 2023-02-18 NOTE — Telephone Encounter (Signed)
Pt has been scheduled for tele pre op appt 03/01/23 @ 2:20. Med rec and consent are done.

## 2023-02-18 NOTE — Telephone Encounter (Signed)
Patient with diagnosis of factor V leiden and DVT on Xarelto for anticoagulation.    Procedure: inguinal hernia repair Date of procedure: TBD  CrCl 47 ml/min (adjusted)  Platelet count 191  Per office protocol, patient can hold Xarelto for 2 days prior to procedure.    Please resume as soon as safely possible.   **This guidance is not considered finalized until pre-operative APP has relayed final recommendations.**

## 2023-02-22 ENCOUNTER — Other Ambulatory Visit: Payer: Self-pay | Admitting: *Deleted

## 2023-02-22 DIAGNOSIS — I119 Hypertensive heart disease without heart failure: Secondary | ICD-10-CM

## 2023-02-22 DIAGNOSIS — Z79899 Other long term (current) drug therapy: Secondary | ICD-10-CM

## 2023-02-22 MED ORDER — VALSARTAN 80 MG PO TABS
80.0000 mg | ORAL_TABLET | Freq: Every day | ORAL | 2 refills | Status: DC
Start: 2023-02-22 — End: 2023-08-15

## 2023-03-01 ENCOUNTER — Ambulatory Visit: Payer: Medicare Other | Attending: Cardiology

## 2023-03-01 DIAGNOSIS — Z01810 Encounter for preprocedural cardiovascular examination: Secondary | ICD-10-CM

## 2023-03-01 DIAGNOSIS — Z01818 Encounter for other preprocedural examination: Secondary | ICD-10-CM

## 2023-03-01 NOTE — Progress Notes (Signed)
Virtual Visit via Telephone Note   Because of Patrick Sosa's co-morbid illnesses, he is at least at moderate risk for complications without adequate follow up.  This format is felt to be most appropriate for this patient at this time.  The patient did not have access to video technology/had technical difficulties with video requiring transitioning to audio format only (telephone).  All issues noted in this document were discussed and addressed.  No physical exam could be performed with this format.  Please refer to the patient's chart for his consent to telehealth for [facility].  Evaluation Performed:  Preoperative cardiovascular risk assessment _____________   Date:  03/01/2023   Patient ID:  Patrick Sosa, DOB 10-23-1945, MRN [id number] Patient Location:  Home Provider location:   Office  Primary Care Provider:  Shirline Frees, NP Primary Cardiologist:  Meriam Sprague, MD (Inactive)  Chief Complaint / Patient Profile   77 y.o. y/o male with a h/o HTN, factor V leiden deficiency with history of DVT on Xarelto, HLD, and polycythemia.   He is pending inguinal hernia repair by Dr. Dossie Der on TBD and presents today for telephonic preoperative cardiovascular risk assessment.  History of Present Illness    Patrick Sosa is a 77 y.o. male who presents via audio/video conferencing for a telehealth visit today.  Pt was last seen in cardiology clinic on 09/17/2022 by Dr.Pemberton.  At that time HORST STIDHAM was doing well.   The patient is now pending procedure as outlined above. Since his last visit, he has remained very active, works in his yard, he walks, he states that he is out in his yard doing a lot of yard work almost daily.  He denies chest pain, palpitations, shortness of breath, dizziness, or excessive fatigue.  He does not do any heavy lifting due to the hernia.  He is medically compliant and denies any bleeding hemoptysis or melena on Xarelto.  Past Medical  History    Past Medical History:  Diagnosis Date   Chronic anticoagulation    Diverticulosis    DVT (deep venous thrombosis) (HCC)    Factor V deficiency (HCC)    Hx of adenomatous colonic polyps 08/20/2018   Hypercholesterolemia    Hypertension    Kidney disease    Kidney stones    Pneumonia    Polycythemia    Prostate cancer [facility])    Past Surgical History:  Procedure Laterality Date   CATARACT EXTRACTION, BILATERAL Bilateral 2018   COLONOSCOPY     ROBOT ASSISTED LAPAROSCOPIC RADICAL PROSTATECTOMY  10/06/2021   TONSILLECTOMY      Allergies  No Known Allergies  Home Medications    Prior to Admission medications   Medication Sig Start Date End Date Taking? Authorizing Provider  amLODipine (NORVASC) 10 MG tablet Take 1 tablet (10 mg total) by mouth daily. 08/20/22   Sharlene Dory, NP  dimenhyDRINATE (DRAMAMINE) 50 MG tablet Take 25 mg by mouth at bedtime as needed. As needed for sleep.    [provider]  gabapentin (NEURONTIN) 300 MG capsule Take 1 capsule (300 mg total) by mouth 3 (three) times daily. 01/04/23   Lenn Sink, DPM  metoprolol succinate (TOPROL XL) 25 MG 24 hr tablet Take 1 tablet (25 mg total) by mouth daily. 08/20/22   Sharlene Dory, NP  pravastatin (PRAVACHOL) 20 MG tablet Take 1 tablet (20 mg total) by mouth daily. 10/06/22   Meriam Sprague, MD  rivaroxaban (XARELTO) 20 MG TABS tablet  Take 1 tablet (20 mg total) by mouth daily with supper. 01/04/23   Meriam Sprague, MD  spironolactone (ALDACTONE) 25 MG tablet Take 0.5 tablets (12.5 mg total) by mouth daily. 10/06/22   Meriam Sprague, MD  tadalafil (CIALIS) 5 MG tablet Take 5 mg by mouth daily. Take 3 times per week. 03/08/22   [provider]  valsartan (DIOVAN) 80 MG tablet Take 1 tablet (80 mg total) by mouth daily. 02/22/23   Rollene Rotunda, MD    Physical Exam    Vital Signs:  Gennie Alma does not have vital signs available for review today.  Given telephonic  nature of communication, physical exam is limited. AAOx3. NAD. Normal affect.  Speech and respirations are unlabored.  Accessory Clinical Findings    None  Assessment & Plan    1.  Preoperative Cardiovascular Risk Assessment:  According to the Revised Cardiac Risk Index (RCRI), his Perioperative Risk of Major Cardiac Event is (%): 0.4  His Functional Capacity in METs is: 7.99 according to the [facility] Activity Status Index (DASI).  Per Pharmacist Patient with diagnosis of factor V leiden and DVT on Xarelto for anticoagulation.     Procedure: inguinal hernia repair Date of procedure: TBD   CrCl 47 ml/min (adjusted)  Platelet count 191   Per office protocol, patient can hold Xarelto for 2 days prior to procedure.     Please resume as soon as safely possible.   The patient was advised that if he develops new symptoms prior to surgery to contact our office to arrange for a follow-up visit, and he verbalized understanding.  Therefore, based on ACC/AHA guidelines, patient would be at acceptable risk for the planned procedure without further cardiovascular testing. I will route this recommendation to the requesting party via Epic fax function.   A copy of this note will be routed to requesting surgeon.  Time:   Today, I have spent 8 minutes with the patient with telehealth technology discussing medical history, symptoms, and management plan.     Joni Reining, NP  03/01/2023, 2:17 PM

## 2023-03-03 ENCOUNTER — Ambulatory Visit: Payer: Self-pay | Admitting: Surgery

## 2023-03-03 NOTE — Patient Instructions (Signed)
DUE TO COVID-19 ONLY TWO VISITORS  (aged 75 and older)  ARE ALLOWED TO COME WITH YOU AND STAY IN THE WAITING ROOM ONLY DURING PRE OP AND PROCEDURE.   **NO VISITORS ARE ALLOWED IN THE SHORT STAY AREA OR RECOVERY ROOM!!**  IF YOU WILL BE ADMITTED INTO THE HOSPITAL YOU ARE ALLOWED ONLY FOUR SUPPORT PEOPLE DURING VISITATION HOURS ONLY (7 AM -8PM)   The support person(s) must pass our screening, gel in and out, and wear a mask at all times, including in the patient's room. Patients must also wear a mask when staff or their support person are in the room. Visitors GUEST BADGE MUST BE WORN VISIBLY  One adult visitor may remain with you overnight and MUST be in the room by 8 P.M.     Your procedure is scheduled on: 03/11/23   Report to [facility] Main Entrance    Report to admitting at : 12:15 PM   Call this number if you have problems the morning of surgery [phone]   Do not eat food :After Midnight.   After Midnight you may have the following liquids until : 11:30 PM DAY OF SURGERY  Water Black Coffee (sugar ok, NO MILK/CREAM OR CREAMERS)  Tea (sugar ok, NO MILK/CREAM OR CREAMERS) regular and decaf                             Plain Jell-O (NO RED)                                           Fruit ices (not with fruit pulp, NO RED)                                     Popsicles (NO RED)                                                                  Juice: apple, WHITE grape, WHITE cranberry Sports drinks like Gatorade (NO RED)             FOLLOW ANY ADDITIONAL PRE OP INSTRUCTIONS YOU RECEIVED FROM YOUR SURGEON'S OFFICE!!!   Oral Hygiene is also important to reduce your risk of infection.                                    Remember - BRUSH YOUR TEETH THE MORNING OF SURGERY WITH YOUR REGULAR TOOTHPASTE  DENTURES WILL BE REMOVED PRIOR TO SURGERY PLEASE DO NOT APPLY "Poly grip" OR ADHESIVES!!!   Do NOT smoke after Midnight   Take these medicines the morning of surgery with A  SIP OF WATER: metoprolol,amlodipine.  Bring CPAP mask and tubing day of surgery.                              You may not have any metal on your body including hair pins, jewelry, and body piercing  Do not wear lotions, powders, perfumes/cologne, or deodorant              Men may shave face and neck.   Do not bring valuables to the hospital. [facility] IS NOT             RESPONSIBLE   FOR VALUABLES.   Contacts, glasses, or bridgework may not be worn into surgery.   Bring small overnight bag day of surgery.   DO NOT BRING YOUR HOME MEDICATIONS TO THE HOSPITAL. PHARMACY WILL DISPENSE MEDICATIONS LISTED ON YOUR MEDICATION LIST TO YOU DURING YOUR ADMISSION IN THE HOSPITAL!    Patients discharged on the day of surgery will not be allowed to drive home.  Someone NEEDS to stay with you for the first 24 hours after anesthesia.   Special Instructions: Bring a copy of your healthcare power of attorney and living will documents         the day of surgery if you haven't scanned them before.              Please read over the following fact sheets you were given: IF YOU HAVE QUESTIONS ABOUT YOUR PRE-OP INSTRUCTIONS PLEASE CALL [phone]    [facility] - Preparing for Surgery Before surgery, you can play an important role.  Because skin is not sterile, your skin needs to be as free of germs as possible.  You can reduce the number of germs on your skin by washing with CHG (chlorahexidine gluconate) soap before surgery.  CHG is an antiseptic cleaner which kills germs and bonds with the skin to continue killing germs even after washing. Please DO NOT use if you have an allergy to CHG or antibacterial soaps.  If your skin becomes reddened/irritated stop using the CHG and inform your nurse when you arrive at Short Stay. Do not shave (including legs and underarms) for at least 48 hours prior to the first CHG shower.  You may shave your face/neck. Please follow these instructions  carefully:  1.  Shower with CHG Soap the night before surgery and the  morning of Surgery.  2.  If you choose to wash your hair, wash your hair first as usual with your  normal  shampoo.  3.  After you shampoo, rinse your hair and body thoroughly to remove the  shampoo.                           4.  Use CHG as you would any other liquid soap.  You can apply chg directly  to the skin and wash                       Gently with a scrungie or clean washcloth.  5.  Apply the CHG Soap to your body ONLY FROM THE NECK DOWN.   Do not use on face/ open                           Wound or open sores. Avoid contact with eyes, ears mouth and genitals (private parts).                       Wash face,  Genitals (private parts) with your normal soap.             6.  Wash thoroughly, paying special attention to the area where your surgery  will be performed.  7.  Thoroughly rinse your body with warm water from the neck down.  8.  DO NOT shower/wash with your normal soap after using and rinsing off  the CHG Soap.                9.  Pat yourself dry with a clean towel.            10.  Wear clean pajamas.            11.  Place clean sheets on your bed the night of your first shower and do not  sleep with pets. Day of Surgery : Do not apply any lotions/deodorants the morning of surgery.  Please wear clean clothes to the hospital/surgery center.  FAILURE TO FOLLOW THESE INSTRUCTIONS MAY RESULT IN THE CANCELLATION OF YOUR SURGERY PATIENT SIGNATURE_________________________________  NURSE SIGNATURE__________________________________  ________________________________________________________________________

## 2023-03-03 NOTE — Progress Notes (Signed)
Pt. Needs orders for surgery. 

## 2023-03-04 ENCOUNTER — Encounter (HOSPITAL_COMMUNITY): Payer: Self-pay

## 2023-03-04 ENCOUNTER — Encounter (HOSPITAL_COMMUNITY)
Admission: RE | Admit: 2023-03-04 | Discharge: 2023-03-04 | Disposition: A | Discharge status: Home / Self Care | Payer: Medicare Other | Source: Ambulatory Visit | Attending: Surgery | Admitting: Surgery

## 2023-03-04 ENCOUNTER — Other Ambulatory Visit: Payer: Self-pay

## 2023-03-04 VITALS — BP 156/80 | HR 71 | Temp 98.0°F | Ht 72.0 in | Wt 202.0 lb

## 2023-03-04 DIAGNOSIS — K4090 Unilateral inguinal hernia, without obstruction or gangrene, not specified as recurrent: Secondary | ICD-10-CM | POA: Insufficient documentation

## 2023-03-04 DIAGNOSIS — Z7901 Long term (current) use of anticoagulants: Secondary | ICD-10-CM | POA: Diagnosis not present

## 2023-03-04 DIAGNOSIS — Z01818 Encounter for other preprocedural examination: Secondary | ICD-10-CM | POA: Diagnosis present

## 2023-03-04 DIAGNOSIS — I129 Hypertensive chronic kidney disease with stage 1 through stage 4 chronic kidney disease, or unspecified chronic kidney disease: Secondary | ICD-10-CM | POA: Diagnosis not present

## 2023-03-04 DIAGNOSIS — C61 Malignant neoplasm of prostate: Secondary | ICD-10-CM | POA: Diagnosis not present

## 2023-03-04 DIAGNOSIS — Z01812 Encounter for preprocedural laboratory examination: Secondary | ICD-10-CM | POA: Insufficient documentation

## 2023-03-04 DIAGNOSIS — N183 Chronic kidney disease, stage 3 unspecified: Secondary | ICD-10-CM | POA: Diagnosis not present

## 2023-03-04 DIAGNOSIS — I10 Essential (primary) hypertension: Secondary | ICD-10-CM

## 2023-03-04 LAB — BASIC METABOLIC PANEL
Anion gap: 9 (ref 5–15)
BUN: 25 mg/dL — ABNORMAL HIGH (ref 8–23)
CO2: 25 mmol/L (ref 22–32)
Calcium: 9.5 mg/dL (ref 8.9–10.3)
Chloride: 101 mmol/L (ref 98–111)
Creatinine, Ser: 1.17 mg/dL (ref 0.61–1.24)
GFR, Estimated: >60 mL/min (ref >60)
Glucose, Bld: 104 mg/dL — ABNORMAL HIGH (ref 70–99)
Potassium: 4.1 mmol/L (ref 3.5–5.1)
Sodium: 135 mmol/L (ref 135–145)

## 2023-03-04 LAB — CBC
HCT: 46 % (ref 39.0–52.0)
Hemoglobin: 16.1 g/dL (ref 13.0–17.0)
MCH: 35.7 pg — ABNORMAL HIGH (ref 26.0–34.0)
MCHC: 35 g/dL (ref 30.0–36.0)
MCV: 102 fL — ABNORMAL HIGH (ref 80.0–100.0)
Platelets: 184 10*3/uL (ref 150–400)
RBC: 4.51 MIL/uL (ref 4.22–5.81)
RDW: 11.8 % (ref 11.5–15.5)
WBC: 8.1 10*3/uL (ref 4.0–10.5)
nRBC: 0 % (ref 0.0–0.2)

## 2023-03-04 NOTE — Progress Notes (Addendum)
For Short Stay: COVID SWAB appointment date:  Bowel Prep reminder:   For Anesthesia: PCP - Shirline Frees, NP  Cardiologist - Meriam Sprague, Clearance: Joni Reining: NP: 03/01/23  Chest x-ray -  EKG - 03/30/22 Stress Test -  ECHO -  Cardiac Cath -  Pacemaker/ICD device last checked: Pacemaker orders received: Device Rep notified:  Spinal Cord Stimulator: N/A  Sleep Study - N/A CPAP -   Fasting Blood Sugar - N/A Checks Blood Sugar _____ times a day Date and result of last Hgb A1c-  Last dose of GLP1 agonist- N/A GLP1 instructions:   Last dose of SGLT-2 inhibitors- N/A SGLT-2 instructions:   Blood Thinner Instructions: Xarelto will be on hold after 03/08/23 Aspirin Instructions: Last Dose:  Activity level: Can go up a flight of stairs and activities of daily living without stopping and without chest pain and/or shortness of breath   Able to exercise without chest pain and/or shortness of breath  Anesthesia review: Hx: HTN,DVT,Factor V deficiency ,Polycythemia.  Patient denies shortness of breath, fever, cough and chest pain at PAT appointment   Patient verbalized understanding of instructions that were given to them at the PAT appointment. Patient was also instructed that they will need to review over the PAT instructions again at home before surgery.

## 2023-03-07 NOTE — Progress Notes (Signed)
Anesthesia Chart Review   Case: [id number] Date/Time: 03/11/23 1415   Procedure: LAPAROSCOPIC POSSIBLE OPEN LEFT INGUINAL HERNIA REPAIR WITH MESH (Left)   Anesthesia type: General   Pre-op diagnosis: LEFT INGUINAL HERNIA   Location: [facility] ROOM 03 / [facility]   Surgeons: Quentin Ore, MD       DISCUSSION:77 y.o. former smoker with h/o HTN, Factor V Leiden, DVT, polycythemia, prostate cancer, CKD Stage III, left inguinal hernia scheduled for above procedure 03/11/2023 with Dr. Ivar Drape.    Per cardiology preoperative evaluation 03/01/2023, "According to the Revised Cardiac Risk Index (RCRI), his Perioperative Risk of Major Cardiac Event is (%): 0.4 His Functional Capacity in METs is: 7.99 according to the [facility] Activity Status Index (DASI).   Per Pharmacist Patient with diagnosis of factor V leiden and DVT on Xarelto for anticoagulation.   Procedure: inguinal hernia repair Date of procedure: TBD CrCl 47 ml/min (adjusted)  Platelet count 191 Per office protocol, patient can hold Xarelto for 2 days prior to procedure.   Please resume as soon as safely possible.    The patient was advised that if he develops new symptoms prior to surgery to contact our office to arrange for a follow-up visit, and he verbalized understanding.   Therefore, based on ACC/AHA guidelines, patient would be at acceptable risk for the planned procedure without further cardiovascular testing. I will route this recommendation to the requesting party via Epic fax function."  VS: BP (!) 156/80   Pulse 71   Temp 36.7 C (Oral)   Ht 6' (1.829 m)   Wt 91.6 kg   SpO2 98%   BMI 27.40 kg/m   PROVIDERS: Shirline Frees, NP is PCP   Laurance Flatten, MD is Cardiologist  LABS: Labs reviewed: Acceptable for surgery. (all labs ordered are listed, but only abnormal results are displayed)  Labs Reviewed  BASIC METABOLIC PANEL - Abnormal; Notable for the following components:      Result Value   Glucose, Bld  104 (*)    BUN 25 (*)    All other components within normal limits  CBC - Abnormal; Notable for the following components:   MCV 102.0 (*)    MCH 35.7 (*)    All other components within normal limits     IMAGES:   EKG:   CV:  Past Medical History:  Diagnosis Date   Chronic anticoagulation    Diverticulosis    DVT (deep venous thrombosis) (HCC)    Factor V deficiency (HCC)    Hx of adenomatous colonic polyps 08/20/2018   Hypercholesterolemia    Hypertension    Kidney disease    Stage III   Kidney stones    Pneumonia    Polycythemia    Prostate cancer [facility])     Past Surgical History:  Procedure Laterality Date   CATARACT EXTRACTION, BILATERAL Bilateral 2018   COLONOSCOPY     ROBOT ASSISTED LAPAROSCOPIC RADICAL PROSTATECTOMY  10/06/2021   TONSILLECTOMY      MEDICATIONS:  acetaminophen (TYLENOL) 500 MG tablet   amLODipine (NORVASC) 10 MG tablet   dimenhyDRINATE (DRAMAMINE) 50 MG tablet   gabapentin (NEURONTIN) 300 MG capsule   metoprolol succinate (TOPROL XL) 25 MG 24 hr tablet   pravastatin (PRAVACHOL) 20 MG tablet   rivaroxaban (XARELTO) 20 MG TABS tablet   spironolactone (ALDACTONE) 25 MG tablet   valsartan (DIOVAN) 80 MG tablet   No current facility-administered medications for this encounter.     Jodell Cipro Ward, PA-C [facility]  Testing [phone]

## 2023-03-11 ENCOUNTER — Other Ambulatory Visit: Payer: Self-pay

## 2023-03-11 ENCOUNTER — Encounter (HOSPITAL_COMMUNITY)
Admission: RE | Disposition: A | Discharge status: Home / Self Care | Payer: Self-pay | Source: Ambulatory Visit | Attending: Surgery

## 2023-03-11 ENCOUNTER — Encounter (HOSPITAL_COMMUNITY): Payer: Self-pay | Admitting: Surgery

## 2023-03-11 ENCOUNTER — Ambulatory Visit (HOSPITAL_BASED_OUTPATIENT_CLINIC_OR_DEPARTMENT_OTHER): Payer: Medicare Other | Admitting: Certified Registered"

## 2023-03-11 ENCOUNTER — Ambulatory Visit (HOSPITAL_COMMUNITY): Payer: Medicare Other | Admitting: Physician Assistant

## 2023-03-11 ENCOUNTER — Ambulatory Visit (HOSPITAL_COMMUNITY)
Admission: RE | Admit: 2023-03-11 | Discharge: 2023-03-11 | Disposition: A | Discharge status: Home / Self Care | Payer: Medicare Other | Source: Ambulatory Visit | Attending: Surgery | Admitting: Surgery

## 2023-03-11 DIAGNOSIS — Z87891 Personal history of nicotine dependence: Secondary | ICD-10-CM | POA: Diagnosis not present

## 2023-03-11 DIAGNOSIS — I129 Hypertensive chronic kidney disease with stage 1 through stage 4 chronic kidney disease, or unspecified chronic kidney disease: Secondary | ICD-10-CM

## 2023-03-11 DIAGNOSIS — N189 Chronic kidney disease, unspecified: Secondary | ICD-10-CM | POA: Diagnosis not present

## 2023-03-11 DIAGNOSIS — I10 Essential (primary) hypertension: Secondary | ICD-10-CM

## 2023-03-11 DIAGNOSIS — K4090 Unilateral inguinal hernia, without obstruction or gangrene, not specified as recurrent: Secondary | ICD-10-CM | POA: Insufficient documentation

## 2023-03-11 DIAGNOSIS — Z86718 Personal history of other venous thrombosis and embolism: Secondary | ICD-10-CM | POA: Diagnosis not present

## 2023-03-11 DIAGNOSIS — Z800 Family history of malignant neoplasm of digestive organs: Secondary | ICD-10-CM | POA: Insufficient documentation

## 2023-03-11 DIAGNOSIS — Z8249 Family history of ischemic heart disease and other diseases of the circulatory system: Secondary | ICD-10-CM | POA: Insufficient documentation

## 2023-03-11 DIAGNOSIS — N183 Chronic kidney disease, stage 3 unspecified: Secondary | ICD-10-CM | POA: Insufficient documentation

## 2023-03-11 DIAGNOSIS — Z7901 Long term (current) use of anticoagulants: Secondary | ICD-10-CM | POA: Insufficient documentation

## 2023-03-11 DIAGNOSIS — Z8546 Personal history of malignant neoplasm of prostate: Secondary | ICD-10-CM | POA: Diagnosis not present

## 2023-03-11 DIAGNOSIS — Z9079 Acquired absence of other genital organ(s): Secondary | ICD-10-CM | POA: Diagnosis not present

## 2023-03-11 DIAGNOSIS — Z803 Family history of malignant neoplasm of breast: Secondary | ICD-10-CM | POA: Diagnosis not present

## 2023-03-11 HISTORY — PX: INGUINAL HERNIA REPAIR: SHX194

## 2023-03-11 SURGERY — REPAIR, HERNIA, INGUINAL, LAPAROSCOPIC
Anesthesia: General | Laterality: Left

## 2023-03-11 MED ORDER — ENOXAPARIN SODIUM 40 MG/0.4ML IJ SOSY
40.0000 mg | PREFILLED_SYRINGE | Freq: Once | INTRAMUSCULAR | Status: AC
  Administered 2023-03-11: 40 mg via SUBCUTANEOUS
  Filled 2023-03-11: qty 0.4

## 2023-03-11 MED ORDER — GABAPENTIN 300 MG PO CAPS
300.0000 mg | ORAL_CAPSULE | ORAL | Status: AC
  Administered 2023-03-11: 300 mg via ORAL
  Filled 2023-03-11: qty 1

## 2023-03-11 MED ORDER — OXYCODONE HCL 5 MG/5ML PO SOLN: 5.0000 mg | Freq: Once | ORAL | Status: DC | PRN

## 2023-03-11 MED ORDER — ONDANSETRON HCL 4 MG/2ML IJ SOLN
INTRAMUSCULAR | Status: DC | PRN
  Administered 2023-03-11: 4 mg via INTRAVENOUS

## 2023-03-11 MED ORDER — CHLORHEXIDINE GLUCONATE CLOTH 2 % EX PADS: 6.0000 | MEDICATED_PAD | Freq: Once | CUTANEOUS | Status: DC

## 2023-03-11 MED ORDER — PHENYLEPHRINE 80 MCG/ML (10ML) SYRINGE FOR IV PUSH (FOR BLOOD PRESSURE SUPPORT)
PREFILLED_SYRINGE | INTRAVENOUS | Status: AC
  Filled 2023-03-11: qty 20

## 2023-03-11 MED ORDER — BUPIVACAINE LIPOSOME 1.3 % IJ SUSP
INTRAMUSCULAR | Status: AC
  Filled 2023-03-11: qty 20

## 2023-03-11 MED ORDER — OXYCODONE HCL 5 MG PO TABS: 5.0000 mg | ORAL_TABLET | Freq: Once | ORAL | Status: DC | PRN

## 2023-03-11 MED ORDER — OXYCODONE-ACETAMINOPHEN 5-325 MG PO TABS
1.0000 | ORAL_TABLET | ORAL | 0 refills | Status: DC | PRN
Start: 2023-03-11 — End: 2023-06-20

## 2023-03-11 MED ORDER — ROCURONIUM BROMIDE 100 MG/10ML IV SOLN
INTRAVENOUS | Status: DC | PRN
  Administered 2023-03-11: 60 mg via INTRAVENOUS

## 2023-03-11 MED ORDER — BUPIVACAINE LIPOSOME 1.3 % IJ SUSP
INTRAMUSCULAR | Status: DC | PRN
  Administered 2023-03-11: 20 mL

## 2023-03-11 MED ORDER — DEXAMETHASONE SODIUM PHOSPHATE 4 MG/ML IJ SOLN
INTRAMUSCULAR | Status: DC | PRN
  Administered 2023-03-11: 5 mg via INTRAVENOUS

## 2023-03-11 MED ORDER — CEFAZOLIN SODIUM-DEXTROSE 2-4 GM/100ML-% IV SOLN
2.0000 g | INTRAVENOUS | Status: AC
  Administered 2023-03-11: 2 g via INTRAVENOUS
  Filled 2023-03-11: qty 100

## 2023-03-11 MED ORDER — CHLORHEXIDINE GLUCONATE 0.12 % MT SOLN
15.0000 mL | Freq: Once | OROMUCOSAL | Status: AC
  Administered 2023-03-11: 15 mL via OROMUCOSAL

## 2023-03-11 MED ORDER — ROCURONIUM BROMIDE 10 MG/ML (PF) SYRINGE
PREFILLED_SYRINGE | INTRAVENOUS | Status: AC
  Filled 2023-03-11: qty 20

## 2023-03-11 MED ORDER — SUGAMMADEX SODIUM 200 MG/2ML IV SOLN
INTRAVENOUS | Status: DC | PRN
Start: 2023-03-11 — End: 2023-03-11
  Administered 2023-03-11: 200 mg via INTRAVENOUS

## 2023-03-11 MED ORDER — 0.9 % SODIUM CHLORIDE (POUR BTL) OPTIME
TOPICAL | Status: DC | PRN
  Administered 2023-03-11: 1000 mL

## 2023-03-11 MED ORDER — PROPOFOL 10 MG/ML IV BOLUS
INTRAVENOUS | Status: DC | PRN
  Administered 2023-03-11: 150 mg via INTRAVENOUS

## 2023-03-11 MED ORDER — LIDOCAINE HCL (CARDIAC) PF 100 MG/5ML IV SOSY
PREFILLED_SYRINGE | INTRAVENOUS | Status: DC | PRN
  Administered 2023-03-11: 100 mg via INTRAVENOUS

## 2023-03-11 MED ORDER — LACTATED RINGERS IV SOLN: INTRAVENOUS | Status: DC

## 2023-03-11 MED ORDER — LIDOCAINE HCL (PF) 2 % IJ SOLN
INTRAMUSCULAR | Status: AC
  Filled 2023-03-11: qty 10

## 2023-03-11 MED ORDER — ORAL CARE MOUTH RINSE: 15.0000 mL | Freq: Once | OROMUCOSAL | Status: AC

## 2023-03-11 MED ORDER — LACTATED RINGERS IV SOLN: INTRAVENOUS | Status: DC | PRN

## 2023-03-11 MED ORDER — ACETAMINOPHEN 500 MG PO TABS
1000.0000 mg | ORAL_TABLET | ORAL | Status: AC
  Administered 2023-03-11: 1000 mg via ORAL
  Filled 2023-03-11: qty 2

## 2023-03-11 MED ORDER — FENTANYL CITRATE (PF) 250 MCG/5ML IJ SOLN
INTRAMUSCULAR | Status: AC
  Filled 2023-03-11: qty 5

## 2023-03-11 MED ORDER — LIDOCAINE 2% (20 MG/ML) 5 ML SYRINGE
INTRAMUSCULAR | Status: DC | PRN
  Administered 2023-03-11: 1.5 mg/kg/h via INTRAVENOUS

## 2023-03-11 MED ORDER — ACETAMINOPHEN 10 MG/ML IV SOLN: 1000.0000 mg | Freq: Once | INTRAVENOUS | Status: DC | PRN

## 2023-03-11 MED ORDER — DROPERIDOL 2.5 MG/ML IJ SOLN: 0.6250 mg | Freq: Once | INTRAMUSCULAR | Status: DC | PRN

## 2023-03-11 MED ORDER — FENTANYL CITRATE (PF) 100 MCG/2ML IJ SOLN
INTRAMUSCULAR | Status: DC | PRN
  Administered 2023-03-11: 150 ug via INTRAVENOUS
  Administered 2023-03-11: 50 ug via INTRAVENOUS

## 2023-03-11 MED ORDER — BUPIVACAINE-EPINEPHRINE 0.25% -1:200000 IJ SOLN
INTRAMUSCULAR | Status: AC
  Filled 2023-03-11: qty 1

## 2023-03-11 MED ORDER — BUPIVACAINE LIPOSOME 1.3 % IJ SUSP: 20.0000 mL | Freq: Once | INTRAMUSCULAR | Status: DC

## 2023-03-11 MED ORDER — BUPIVACAINE-EPINEPHRINE (PF) 0.25% -1:200000 IJ SOLN
INTRAMUSCULAR | Status: DC | PRN
  Administered 2023-03-11: 30 mL

## 2023-03-11 MED ORDER — FENTANYL CITRATE PF 50 MCG/ML IJ SOSY: 25.0000 ug | PREFILLED_SYRINGE | INTRAMUSCULAR | Status: DC | PRN

## 2023-03-11 SURGICAL SUPPLY — 39 items
ADH SKN CLS APL DERMABOND .7 (GAUZE/BANDAGES/DRESSINGS) ×1
APL PRP STRL LF DISP 70% ISPRP (MISCELLANEOUS) ×1
BAG COUNTER SPONGE SURGICOUNT (BAG) ×2 IMPLANT
BAG SPNG CNTER NS LX DISP (BAG) ×1
CABLE HIGH FREQUENCY MONO STRZ (ELECTRODE) ×2 IMPLANT
CHLORAPREP W/TINT 26 (MISCELLANEOUS) ×2 IMPLANT
DERMABOND ADVANCED .7 DNX12 (GAUZE/BANDAGES/DRESSINGS) ×2 IMPLANT
ELECT REM PT RETURN 15FT ADLT (MISCELLANEOUS) ×2 IMPLANT
GLOVE BIO SURGEON STRL SZ7.5 (GLOVE) ×2 IMPLANT
GLOVE INDICATOR 8.0 STRL GRN (GLOVE) ×2 IMPLANT
GOWN STRL REUS W/ TWL XL LVL3 (GOWN DISPOSABLE) ×4 IMPLANT
GOWN STRL REUS W/TWL XL LVL3 (GOWN DISPOSABLE) ×2
GRASPER SUT TROCAR 14GX15 (MISCELLANEOUS) ×2 IMPLANT
IRRIG SUCT STRYKERFLOW 2 WTIP (MISCELLANEOUS) ×1
IRRIGATION SUCT STRKRFLW 2 WTP (MISCELLANEOUS) IMPLANT
KIT BASIN OR (CUSTOM PROCEDURE TRAY) ×2 IMPLANT
KIT TURNOVER KIT A (KITS) IMPLANT
MARKER SKIN DUAL TIP RULER LAB (MISCELLANEOUS) ×2 IMPLANT
MESH 3DMAX 5X7 LT XLRG (Mesh General) IMPLANT
NDL INSUFFLATION 14GA 120MM (NEEDLE) ×4 IMPLANT
NEEDLE INSUFFLATION 14GA 120MM (NEEDLE) ×2
RELOAD STAPLE 4.0 BLU F/HERNIA (INSTRUMENTS) IMPLANT
RELOAD STAPLE 4.8 BLK F/HERNIA (STAPLE) ×2 IMPLANT
RELOAD STAPLE HERNIA 4.0 BLUE (INSTRUMENTS) ×1
RELOAD STAPLE HERNIA 4.8 BLK (STAPLE) ×1
SCISSORS LAP 5X35 DISP (ENDOMECHANICALS) ×2 IMPLANT
SET TUBE SMOKE EVAC HIGH FLOW (TUBING) ×2 IMPLANT
SPIKE FLUID TRANSFER (MISCELLANEOUS) IMPLANT
STAPLER HERNIA 12 8.5 360D (INSTRUMENTS) ×2 IMPLANT
SUT MNCRL AB 4-0 PS2 18 (SUTURE) ×2 IMPLANT
SUT V-LOC BARB 180 2/0GR6 GS22 (SUTURE) ×1
SUT VICRYL 0 UR6 27IN ABS (SUTURE) IMPLANT
SUTURE V-LC BRB 180 2/0GR6GS22 (SUTURE) IMPLANT
TOWEL OR 17X26 10 PK STRL BLUE (TOWEL DISPOSABLE) ×2 IMPLANT
TRAY FOL W/BAG SLVR 16FR STRL (SET/KITS/TRAYS/PACK) ×2 IMPLANT
TRAY FOLEY W/BAG SLVR 16FR LF (SET/KITS/TRAYS/PACK) ×1
TRAY LAPAROSCOPIC (CUSTOM PROCEDURE TRAY) ×2 IMPLANT
TROCAR ADV FIXATION 12X100MM (TROCAR) ×2 IMPLANT
TROCAR Z-THREAD OPTICAL 5X100M (TROCAR) ×4 IMPLANT

## 2023-03-11 NOTE — Transfer of Care (Signed)
Immediate Anesthesia Transfer of Care Note  Patient: Patrick Sosa  Procedure(s) Performed: LAPAROSCOPIC POSSIBLE OPEN LEFT INGUINAL HERNIA REPAIR WITH MESH (Left)  Patient Location: PACU  Anesthesia Type:General  Level of Consciousness: awake and alert   Airway & Oxygen Therapy: Patient Spontanous Breathing and Patient connected to face mask oxygen  Post-op Assessment: Report given to RN and Post -op Vital signs reviewed and stable  Post vital signs: Reviewed and stable  Last Vitals:  Vitals Value Taken Time  BP 147/67 03/11/23 1619  Temp    Pulse 61 03/11/23 1620  Resp 5 03/11/23 1620  SpO2 93 % 03/11/23 1620  Vitals shown include unfiled device data.  Last Pain:  Vitals:   03/11/23 1229  TempSrc: Oral         Complications: No notable events documented.

## 2023-03-11 NOTE — Anesthesia Procedure Notes (Signed)
Procedure Name: Intubation Date/Time: 03/11/2023 3:19 PM  Performed by: Deri Fuelling, CRNAPre-anesthesia Checklist: Patient identified, Emergency Drugs available, Suction available and Patient being monitored Patient Re-evaluated:Patient Re-evaluated prior to induction Oxygen Delivery Method: Circle system utilized Preoxygenation: Pre-oxygenation with 100% oxygen Induction Type: IV induction Ventilation: Mask ventilation without difficulty Laryngoscope Size: Mac and 4 Grade View: Grade II Tube type: Oral Tube size: 7.5 mm Number of attempts: 1 Airway Equipment and Method: Stylet and Oral airway Placement Confirmation: ETT inserted through vocal cords under direct vision, positive ETCO2 and breath sounds checked- equal and bilateral Secured at: 22 cm Tube secured with: Tape Dental Injury: Teeth and Oropharynx as per pre-operative assessment

## 2023-03-11 NOTE — Anesthesia Postprocedure Evaluation (Signed)
Anesthesia Post Note  Patient: Patrick Sosa  Procedure(s) Performed: LAPAROSCOPIC POSSIBLE OPEN LEFT INGUINAL HERNIA REPAIR WITH MESH (Left)     Patient location during evaluation: PACU Anesthesia Type: General Level of consciousness: awake and alert Pain management: pain level controlled Vital Signs Assessment: post-procedure vital signs reviewed and stable Respiratory status: spontaneous breathing, nonlabored ventilation, respiratory function stable and patient connected to nasal cannula oxygen Cardiovascular status: blood pressure returned to baseline and stable Postop Assessment: no apparent nausea or vomiting Anesthetic complications: no   No notable events documented.  Last Vitals:  Vitals:   03/11/23 1700 03/11/23 1701  BP: (!) 142/71   Pulse: 63   Resp: 14   Temp: 36.5 C   SpO2: 90% 93%    Last Pain:  Vitals:   03/11/23 1700  TempSrc:   PainSc: 2                  [place]

## 2023-03-11 NOTE — Discharge Instructions (Signed)
GROIN HERNIA REPAIR POST OPERATIVE INSTRUCTIONS  Thinking Clearly  The anesthesia may cause you to feel different for 1 or 2 days. Do not drive, drink alcohol, or make any big decisions for at least 2 days.  Nutrition When you wake up, you will be able to drink small amounts of liquid. If you do not feel sick, you can slowly advance your diet to regular foods. Continue to drink lots of fluids, usually about 8 to 10 glasses per day. Eat a high-fiber diet so you don't strain during bowel movements. High-Fiber Foods Foods high in fiber include beans, bran cereals and whole-grain breads, peas, dried fruit (figs, apricots, and dates), raspberries, blackberries, strawberries, sweet corn, broccoli, baked potatoes with skin, plums, pears, apples, greens, and nuts. Activity Slowly increase your activity. Be sure to get up and walk every hour or so to prevent blood clots. No heavy lifting or strenuous activity for 4 weeks following surgery to prevent hernias at your incision sites or recurrence of your hernia. It is normal to feel tired. You may need more sleep than usual.  Get your rest but make sure to get up and move around frequently to prevent blood clots and pneumonia.  Work and Return to [employer] can go back to work when you feel well enough. Discuss the timing with your surgeon. You can usually go back to school or work 1 week or less after an laparoscopic or an open repair. If your work requires heavy lifting or strenuous activity you need to be placed on light duty for 4 weeks following surgery. You can return to gym class, sports or other physical activities 4 weeks after surgery.  Wound Care You may experience significant bruising in the groin including into the scrotum in males.  Rest, elevating the groin and scrotum above the level of the heart, ice and compression with tight fitting underwear can help.  Always wash your hands before and after touching near your incision site. Do  not soak in a bathtub until cleared at your follow up appointment. You may take a shower 24 hours after surgery. A small amount of drainage from the incision is normal. If the drainage is thick and yellow or the site is red, you may have an infection, so call your surgeon. If you have a drain in one of your incisions, it will be taken out in office when the drainage stops. Steri-Strips will fall off in 7 to 10 days or they will be removed during your first office visit. If you have dermabond glue covering over the incision, allow the glue to flake off on its own. Protect the new skin, especially from the sun. The sun can burn and cause darker scarring. Your scar will heal in about 4 to 6 weeks and will become softer and continue to fade over the next year.  The cosmetic appearance of the incisions will improve over the course of the first year after surgery. Sensation around your incision will return in a few weeks or months.  Bowel Movements After intestinal surgery, you may have loose watery stools for several days. If watery diarrhea lasts longer than 3 days, contact your surgeon. Pain medication (narcotics) can cause constipation. Increase the fiber in your diet with high-fiber foods if you are constipated. You can take an over the counter stool softener like Colace to avoid constipation.  Additional over the counter medications can also be used if Colace isn't sufficient (for example, Milk of Magnesia or Miralax).  Pain The amount of pain is different for each person. Some people need only 1 to 3 doses of pain control medication, while others need more. Take alternating doses of tylenol and ibuprofen around the clock for the first five days following surgery.  This will provide a baseline of pain control and help with inflammation.  Take the narcotic pain medication in addition if needed for severe pain.  Contact Your Surgeon at [phone], if you have: Pain that will not go away Pain that  gets worse A fever of more than 101F (38.3C) Repeated vomiting Swelling, redness, bleeding, or bad-smelling drainage from your wound site Strong abdominal pain No bowel movement or unable to pass gas for 3 days Watery diarrhea lasting longer than 3 days  Pain Control The goal of pain control is to minimize pain, keep you moving and help you heal. Your surgical team will work with you on your pain plan. Most often a combination of therapies and medications are used to control your pain. You may also be given medication (local anesthetic) at the surgical site. This may help control your pain for several days. Extreme pain puts extra stress on your body at a time when your body needs to focus on healing. Do not wait until your pain has reached a level "10" or is unbearable before telling your doctor or nurse. It is much easier to control pain before it becomes severe. Following a laparoscopic procedure, pain is sometimes felt in the shoulder. This is due to the gas inserted into your abdomen during the procedure. Moving and walking helps to decrease the gas and the right shoulder pain.  Use the guide below for ways to manage your post-operative pain. Learn more by going to facs.org/safepaincontrol.  How Intense Is My Pain Common Therapies to Feel Better       I hardly notice my pain, and it does not interfere with my activities.  I notice my pain and it distracts me, but I can still do activities (sitting up, walking, standing).  Non-Medication Therapies  Ice (in a bag, applied over clothing at the surgical site), elevation, rest, meditation, massage, distraction (music, TV, play) walking and mild exercise Splinting the abdomen with pillows +  Non-Opioid Medications Acetaminophen (Tylenol) Non-steroidal anti-inflammatory drugs (NSAIDS) Aspirin, Ibuprofen (Motrin, Advil) Naproxen (Aleve) Take these as needed, when you feel pain. Both acetaminophen and NSAIDs help to decrease pain  and swelling (inflammation).      My pain is hard to ignore and is more noticeable even when I rest.  My pain interferes with my usual activities.  Non-Medication Therapies  +  Non-Opioid medications  Take on a regular schedule (around-the-clock) instead of as needed. (For example, Tylenol every 6 hours at 9:00 am, 3:00 pm, 9:00 pm, 3:00 am and Motrin every 6 hours at 12:00 am, 6:00 am, 12:00 pm, 6:00 pm)         I am focused on my pain, and I am not doing my daily activities.  I am groaning in pain, and I cannot sleep. I am unable to do anything.  My pain is as bad as it could be, and nothing else matters.  Non-Medication Therapies  +  Around-the-Clock Non-Opioid Medications  +  Short-acting opioids  Opioids should be used with other medications to manage severe pain. Opioids block pain and give a feeling of euphoria (feel high). Addiction, a serious side effect of opioids, is rare with short-term (a few days) use.  Examples of short-acting opioids  include: Tramadol (Ultram), Hydrocodone (Norco, Vicodin), Hydromorphone (Dilaudid), Oxycodone (Oxycontin)     The above directions have been adapted from the [employer] of [employer] Surgical Patient Education Program.  Please refer to the ACS website if needed: http://chapman.info/.ashx   Ivar Drape, MD [facility] [address], [place], [place]  [address] ?  [address] [address], [place], [place]   [address] [phone] ? [phone] ? FAX [phone] Web site: [email or web address]

## 2023-03-11 NOTE — Op Note (Signed)
Patient: Patrick Sosa ([birth year], [id number])  Date of Surgery: 03/11/23  Preoperative Diagnosis: LEFT INGUINAL HERNIA   Postoperative Diagnosis: LEFT INGUINAL HERNIA   Surgical Procedure: LAPAROSCOPIC POSSIBLE OPEN LEFT INGUINAL HERNIA REPAIR WITH MESH: [id number]   Operative Team Members:  Surgeons and Role:    * Thayden Lemire, Hyman Hopes, MD - Primary   Anesthesiologist: [place], MD CRNA: Deri Fuelling, CRNA   Anesthesia: General   Fluids:  Total I/O In: 100 [IV Piggyback:100] Out: -   Complications: None  Drains:  None  Specimen: None  Disposition:  PACU - hemodynamically stable.  Plan of Care: Discharge to home after PACU  Indications for Procedure:  Patrick Sosa is an 77 y.o. male with a left inguinal hernia.  I recommended laparoscopic, possibly open, left inguinal hernia repair with mesh. We discussed the procedure, its risks, benefits and alternatives.  After a full discussion and all questions answered, the patient granted consent to proceed.   Findings:  Technique: Transabdominal preperitoneal (TAPP) Hernia Location: left indirect inguinal Mesh Size &Type:  Bard 3D max extra large left sided mesh Mesh Fixation: Endo-Universal hernia stapler  Infection status: Patient: Private Patient Elective Case Case: Elective Infection Present At Time Of Surgery (PATOS): None   Description of Procedure:  The patient was positioned supine, padded and secured to the bed, with both arms tucked.  The abdomen was widely prepped and draped.  A time out procedure was performed.  A 1 cm infraumbilical incision was made.  The abdomen was entered without trauma to the underlying viscera.  The abdomen was insufflated to 15 mm of Hg.  A 12 mm trocar was inserted at the periumbilical incision.  Additional 5 mm trocars were placed in the left and right abdomen.  There was no trauma to the underlying viscera.  There was an indirect hernia on the LEFT.  Utilizing a  transabdominal pre peritoneal technique (TAPP), a horizontal incision was made in the peritoneum, immediately below the umbilicus.  Dissection was carried out in the pre peritoneal space down to the level of the hernia sac which was reduced into the peritoneal cavity completely.  The cord contents were parietalized and preserved.  A large pre peritoneal dissection was performed to uncover the direct, indirect, femoral and obturator spaces.  Cooper's ligament was uncovered medially and the psoas muscle uncovered laterally.  There was significant scar tissue medially near the previous prostatectomy which was carefully dissected to avoid injury to surrounding structures.    The mesh, as documented above, was opened and advanced into the pre peritoneal position so that it more than adequately covered the indirect, direct, femoral and obturator spaces.  The mesh laid flat, with no inferior folds and covered the entire myopectineal orifice.  The mesh was fixated with the endo-universal hernia stapler to Cooper's ligament and the posterior aspect of the rectus muscle.  The peritoneal flap was closed with the same device.  A hole had been created in the peritoneal flap near some of the scar tissue from the prostatectomy.  This was repaired using a running 2-0 v-loc suture.  There were no peritoneal defects or exposed mesh at the conclusion.  The umbilical trocar was removed and the fascial defect was closed with a 0 Vicryl suture.  The peritoneal cavity was completely desufflated, the trocars removed and the skin closed with 4-0 Monocryl subcuticular suture and skin glue.  All sponge and needle counts were correct at the end of the case.  Ivar Drape,  MD General, Bariatric, & Minimally Invasive Surgery [facility], [place]

## 2023-03-11 NOTE — H&P (Signed)
Admitting Physician: Hyman Hopes Daelon Dunivan  Service: General surgery  CC: left inguinal hernia  Subjective   HPI: Patrick Sosa is an 77 y.o. male who is here for left inguinal hernia repair  Past Medical History:  Diagnosis Date   Chronic anticoagulation    Diverticulosis    DVT (deep venous thrombosis) (HCC)    Factor V deficiency (HCC)    Hx of adenomatous colonic polyps 08/20/2018   Hypercholesterolemia    Hypertension    Kidney disease    Stage III   Kidney stones    Pneumonia    Polycythemia    Prostate cancer [facility])     Past Surgical History:  Procedure Laterality Date   CATARACT EXTRACTION, BILATERAL Bilateral 2018   COLONOSCOPY     ROBOT ASSISTED LAPAROSCOPIC RADICAL PROSTATECTOMY  10/06/2021   TONSILLECTOMY      Family History  Problem Relation Age of Onset   Alzheimer's disease Mother    Arthritis Mother    Pancreatic cancer Father    Hypertension Father    Heart attack Father    Hypertension Sister    Breast cancer Sister    Arthritis Sister    Rheum arthritis Daughter    Colon cancer Neg Hx    Stomach cancer Neg Hx    Rectal cancer Neg Hx    Liver cancer Neg Hx    Esophageal cancer Neg Hx     Social:  reports that he quit smoking about 41 years ago. His smoking use included cigarettes. He has never used smokeless tobacco. He reports that he does not drink alcohol and does not use drugs.  Allergies: No Known Allergies  Medications: Current Outpatient Medications  Medication Instructions   acetaminophen (TYLENOL) 1,000 mg, Oral, Daily at bedtime   amLODipine (NORVASC) 10 mg, Oral, Daily   dimenhyDRINATE (DRAMAMINE) 75 mg, Oral, Daily at bedtime   gabapentin (NEURONTIN) 300 mg, Oral, 3 times daily   metoprolol succinate (TOPROL XL) 25 mg, Oral, Daily   pravastatin (PRAVACHOL) 20 mg, Oral, Daily   rivaroxaban (XARELTO) 20 mg, Oral, Daily with supper   spironolactone (ALDACTONE) 12.5 mg, Oral, Daily   valsartan (DIOVAN) 80 mg, Oral, Daily     ROS - all of the below systems have been reviewed with the patient and positives are indicated with bold text General: chills, fever or night sweats Eyes: blurry vision or double vision ENT: epistaxis or sore throat Allergy/Immunology: itchy/watery eyes or nasal congestion Hematologic/Lymphatic: bleeding problems, blood clots or swollen lymph nodes Endocrine: temperature intolerance or unexpected weight changes Breast: new or changing breast lumps or nipple discharge Resp: cough, shortness of breath, or wheezing CV: chest pain or dyspnea on exertion GI: as per HPI GU: dysuria, trouble voiding, or hematuria MSK: joint pain or joint stiffness Neuro: TIA or stroke symptoms Derm: pruritus and skin lesion changes Psych: anxiety and depression  Objective   PE There were no vitals taken for this visit. Constitutional: NAD; conversant; no deformities Eyes: Moist conjunctiva; no lid lag; anicteric; PERRL Neck: Trachea midline; no thyromegaly Lungs: Normal respiratory effort; no tactile fremitus CV: RRR; no palpable thrills; no pitting edema GI: Abd soft, nontender; no palpable hepatosplenomegaly MSK: Normal range of motion of extremities; no clubbing/cyanosis Psychiatric: Appropriate affect; alert and oriented x3 Lymphatic: No palpable cervical or axillary lymphadenopathy  No results found for this or any previous visit (from the past 24 hour(s)).  Imaging Orders  No imaging studies ordered today     Assessment and Plan  Patrick Sosa is an 77 y.o. male with a left inguinal hernia.  I recommended laparoscopic, possibly open, left inguinal hernia repair with mesh. We discussed the procedure, its risks, benefits and alternatives.  After a full discussion and all questions answered, the patient granted consent to proceed.   Quentin Ore, MD  [facility] Use [email or web address] to contact on call provider

## 2023-03-11 NOTE — Anesthesia Preprocedure Evaluation (Signed)
Anesthesia Evaluation  Patient identified by MRN, date of birth, ID band Patient awake    Reviewed: Allergy & Precautions, H&P , NPO status , Patient's Chart, lab work & pertinent test results  Airway Mallampati: II  TM Distance: >3 FB Neck ROM: Full    Dental no notable dental hx.    Pulmonary neg pulmonary ROS, former smoker   Pulmonary exam normal breath sounds clear to auscultation       Cardiovascular hypertension, Normal cardiovascular exam Rhythm:Regular Rate:Normal     Neuro/Psych negative neurological ROS  negative psych ROS   GI/Hepatic negative GI ROS, Neg liver ROS,,,  Endo/Other  negative endocrine ROS    Renal/GU CRFRenal disease  negative genitourinary   Musculoskeletal negative musculoskeletal ROS (+)    Abdominal   Peds negative pediatric ROS (+)  Hematology  (+) Blood dyscrasia, FACTOR VIII (HEMOPHILIA) DVT x3    Anesthesia Other Findings   Reproductive/Obstetrics negative OB ROS                              Anesthesia Physical Anesthesia Plan  ASA: 3  Anesthesia Plan: General   Post-op Pain Management:    Induction: Intravenous  PONV Risk Score and Plan: Ondansetron and Dexamethasone  Airway Management Planned: Oral ETT  Additional Equipment:   Intra-op Plan:   Post-operative Plan: Extubation in OR  Informed Consent: I have reviewed the patients History and Physical, chart, labs and discussed the procedure including the risks, benefits and alternatives for the proposed anesthesia with the patient or authorized representative who has indicated his/her understanding and acceptance.     Dental advisory given  Plan Discussed with: CRNA  Anesthesia Plan Comments:          Anesthesia Quick Evaluation

## 2023-03-14 ENCOUNTER — Encounter (HOSPITAL_COMMUNITY): Payer: Self-pay | Admitting: Surgery

## 2023-05-06 ENCOUNTER — Other Ambulatory Visit (HOSPITAL_COMMUNITY): Payer: Medicare Other

## 2023-05-10 ENCOUNTER — Other Ambulatory Visit: Payer: Self-pay | Admitting: Nurse Practitioner

## 2023-06-20 ENCOUNTER — Ambulatory Visit: Payer: Medicare Other

## 2023-06-20 VITALS — Ht 72.0 in | Wt 205.0 lb

## 2023-06-20 DIAGNOSIS — Z0000 Encounter for general adult medical examination without abnormal findings: Secondary | ICD-10-CM

## 2023-06-20 NOTE — Progress Notes (Signed)
Subjective:   Patrick Sosa is a 77 y.o. male who presents for Medicare Annual/Subsequent preventive examination.  Visit Complete: Virtual I connected with  Patrick Sosa on 06/20/23 by a audio enabled telemedicine application and verified that I am speaking with the correct person using two identifiers.  Patient Location: Home  Provider Location: Home Office  I discussed the limitations of evaluation and management by telemedicine. The patient expressed understanding and agreed to proceed.  Vital Signs: Because this visit was a virtual/telehealth visit, some criteria may be missing or patient reported. Any vitals not documented were not able to be obtained and vitals that have been documented are patient reported.  Patient Medicare AWV questionnaire was completed by the patient on 06/16/23; I have confirmed that all information answered by patient is correct and no changes since this date.  Cardiac Risk Factors include: advanced age (>58men, >70 women);male gender;hypertension     Objective:    Today's Vitals   06/20/23 0855  Weight: 205 lb (93 kg)  Height: 6' (1.829 m)   Body mass index is 27.8 kg/m.     06/20/2023    9:03 AM 03/11/2023   12:42 PM 03/04/2023   12:59 PM 06/15/2022    8:59 AM 04/08/2020    8:31 AM  Advanced Directives  Does Patient Have a Medical Advance Directive? Yes Yes Yes Yes No  Type of Estate agent of [place];Living will Healthcare Power of [place];Living will Living will;Healthcare Power of [employer] Power of [place];Living will   Does patient want to make changes to medical advance directive? No - Patient declined No - Patient declined     Copy of Healthcare Power of Attorney in Chart? Yes - validated most recent copy scanned in chart (See row information) Yes - validated most recent copy scanned in chart (See row information)  No - copy requested   Would patient like information on creating a medical advance  directive?     No - Patient declined    Current Medications (verified) Outpatient Encounter Medications as of 06/20/2023  Medication Sig   acetaminophen (TYLENOL) 500 MG tablet Take 1,000 mg by mouth at bedtime.   amLODipine (NORVASC) 10 MG tablet TAKE 1 TABLET(10 MG) BY MOUTH DAILY   gabapentin (NEURONTIN) 300 MG capsule Take 1 capsule (300 mg total) by mouth 3 (three) times daily. (Patient not taking: Reported on 03/02/2023)   metoprolol succinate (TOPROL-XL) 25 MG 24 hr tablet TAKE 1 TABLET(25 MG) BY MOUTH DAILY   pravastatin (PRAVACHOL) 20 MG tablet Take 1 tablet (20 mg total) by mouth daily.   rivaroxaban (XARELTO) 20 MG TABS tablet Take 1 tablet (20 mg total) by mouth daily with supper.   spironolactone (ALDACTONE) 25 MG tablet Take 0.5 tablets (12.5 mg total) by mouth daily.   valsartan (DIOVAN) 80 MG tablet Take 1 tablet (80 mg total) by mouth daily.   [DISCONTINUED] dimenhyDRINATE (DRAMAMINE) 50 MG tablet Take 75 mg by mouth at bedtime.   [DISCONTINUED] oxyCODONE-acetaminophen (PERCOCET) 5-325 MG tablet Take 1 tablet by mouth every 4 (four) hours as needed for severe pain.   No facility-administered encounter medications on file as of 06/20/2023.    Allergies (verified) Patient has no known allergies.   History: Past Medical History:  Diagnosis Date   Chronic anticoagulation    Diverticulosis    DVT (deep venous thrombosis) (HCC)    Factor V deficiency (HCC)    Hx of adenomatous colonic polyps 08/20/2018   Hypercholesterolemia  Hypertension    Kidney disease    Stage III   Kidney stones    Pneumonia    Polycythemia    Prostate cancer [facility])    Past Surgical History:  Procedure Laterality Date   CATARACT EXTRACTION, BILATERAL Bilateral 2018   COLONOSCOPY     INGUINAL HERNIA REPAIR Left 03/11/2023   Procedure: LAPAROSCOPIC POSSIBLE OPEN LEFT INGUINAL HERNIA REPAIR WITH MESH;  Surgeon: Stechschulte, Hyman Hopes, MD;  Location: [facility];  Service: General;  Laterality: Left;    ROBOT ASSISTED LAPAROSCOPIC RADICAL PROSTATECTOMY  10/06/2021   TONSILLECTOMY     Family History  Problem Relation Age of Onset   Alzheimer's disease Mother    Arthritis Mother    Pancreatic cancer Father    Hypertension Father    Heart attack Father    Hypertension Sister    Breast cancer Sister    Arthritis Sister    Rheum arthritis Daughter    Colon cancer Neg Hx    Stomach cancer Neg Hx    Rectal cancer Neg Hx    Liver cancer Neg Hx    Esophageal cancer Neg Hx    Social History   Socioeconomic History   Marital status: Married    Spouse name: Not on file   Number of children: 3   Years of education: Not on file   Highest education level: Bachelor's degree (e.g., BA, AB, BS)  Occupational History   Occupation: retired  Tobacco Use   Smoking status: Former    Current packs/day: 0.00    Types: Cigarettes    Quit date: 10/11/1981    Years since quitting: 41.7   Smokeless tobacco: Never  Vaping Use   Vaping status: Never Used  Substance and Sexual Activity   Alcohol use: No    Alcohol/week: 0.0 standard drinks of alcohol    Comment: rarely   Drug use: No   Sexual activity: Yes  Other Topics Concern   Not on file  Social History Narrative   Retired    - IT trainer    Married    Three children    Six grandchildren       He is active at [employer] D[employer] of [employer]: Low Risk  (06/20/2023)   Overall Financial Resource Strain (CARDIA)    Difficulty of Paying Living Expenses: Not hard at all  Food Insecurity: No Food Insecurity (06/20/2023)   Hunger Vital Sign    Worried About Running Out of Food in the Last Year: Never true    Ran Out of Food in the Last Year: Never true  Transportation Needs: No Transportation Needs (06/20/2023)   PRAPARE - Administrator, Civil Service (Medical): No    Lack of Transportation (Non-Medical): No  Physical Activity: Sufficiently Active (06/20/2023)   Exercise Vital Sign    Days of  Exercise per Week: 5 days    Minutes of Exercise per Session: 30 min  Stress: No Stress Concern Present (06/20/2023)   [employer] of Occupational Health - Occupational Stress Questionnaire    Feeling of Stress : Not at all  Social Connections: Moderately Isolated (06/20/2023)   Social Connection and Isolation Panel [NHANES]    Frequency of Communication with Friends and Family: More than three times a week    Frequency of Social Gatherings with Friends and Family: More than three times a week    Attends Religious Services: Never    Production manager of [employer]  or Organizations: No    Attends Banker Meetings: Never    Marital Status: Married    Tobacco Counseling Counseling given: Not Answered   Clinical Intake:  Pre-visit preparation completed: Yes  Pain : No/denies pain     BMI - recorded: 27.8 Nutritional Status: BMI 25 -29 Overweight Nutritional Risks: None Diabetes: No  How often do you need to have someone help you when you read instructions, pamphlets, or other written materials from your doctor or pharmacy?: 1 - Never  Interpreter Needed?: No  Information entered by :: Theresa Mulligan LPN   Activities of Daily Living    06/20/2023    9:02 AM 06/16/2023    9:04 AM  In your present state of health, do you have any difficulty performing the following activities:  Hearing? 1 0  Comment Wears hearing aids   Vision? 0 0  Difficulty concentrating or making decisions? 0 0  Walking or climbing stairs? 0 0  Dressing or bathing? 0 0  Doing errands, shopping? 0 0  Preparing Food and eating ? N N  Using the Toilet? N N  In the past six months, have you accidently leaked urine? Patrick Sosa  Comment Wears Depends. Followed by medical attention   Do you have problems with loss of bowel control? N N  Managing your Medications? N N  Managing your Finances? N N  Housekeeping or managing your Housekeeping? N N    Patient Care Team: Shirline Frees, NP as PCP -  General (Family Medicine) Meriam Sprague, MD (Inactive) as PCP - Cardiology (Cardiology) Bjorn Pippin, MD as Attending Physician (Urology)  Indicate any recent Medical Services you may have received from other than [facility] providers in the past year (date may be approximate).     Assessment:   This is a routine wellness examination for Patrick Sosa.  Hearing/Vision screen Hearing Screening - Comments:: Wears hearing aids Vision Screening - Comments:: - up to date with routine eye exams with  Dr Sherrine Maples   Goals Addressed               This Visit's Progress     Stay Active (pt-stated)         Depression Screen    11/18/2022   11:46 AM 06/15/2022    8:54 AM 05/05/2022    8:02 AM 04/08/2020    8:33 AM 08/22/2019    8:25 AM 04/14/2016    9:25 AM 04/01/2015    8:44 AM  PHQ 2/9 Scores  PHQ - 2 Score 0 0 0 0 0 0 0  PHQ- 9 Score 4 0 0 0       Fall Risk    06/20/2023    9:04 AM 06/16/2023    9:04 AM 11/18/2022   11:46 AM 11/17/2022    7:40 PM 06/15/2022    8:57 AM  Fall Risk   Falls in the past year? 0 0 0 0 0  Number falls in past yr: 0 0 0  0  Injury with Fall? 0 0 0  0  Risk for fall due to : No Fall Risks  No Fall Risks  No Fall Risks  Follow up Falls prevention discussed  Falls evaluation completed  Falls prevention discussed    MEDICARE RISK AT HOME: Medicare Risk at Home Any stairs in or around the home?: (Patient-Rptd) Yes If so, are there any without handrails?: (Patient-Rptd) No Home free of loose throw rugs in walkways, pet beds, electrical cords, etc?: (Patient-Rptd) Yes  Adequate lighting in your home to reduce risk of falls?: (Patient-Rptd) Yes Life alert?: (Patient-Rptd) No Use of a cane, walker or w/c?: (Patient-Rptd) No Grab bars in the bathroom?: (Patient-Rptd) No Shower chair or bench in shower?: (Patient-Rptd) No Elevated toilet seat or a handicapped toilet?: (Patient-Rptd) No  TIMED UP AND GO:  Was the test performed?  No    Cognitive Function:         06/20/2023    9:04 AM 06/15/2022    8:59 AM  6CIT Screen  What Year? 0 points 0 points  What month? 0 points 0 points  What time? 0 points 0 points  Count back from 20 0 points 0 points  Months in reverse 0 points 0 points  Repeat phrase 0 points 0 points  Total Score 0 points 0 points    Immunizations Immunization History  Administered Date(s) Administered   Dtap, Unspecified 08/25/2022, 10/20/2022   Influenza Split 04/23/2011, 04/07/2012   Influenza Whole 06/02/2009, 05/21/2010   Influenza, High Dose Seasonal PF 04/01/2015, 04/14/2016, 04/19/2017, 04/18/2018, 03/06/2019, 04/11/2023   Influenza-Unspecified 03/21/2014, 02/20/2020, 03/29/2021   Moderna Covid-19 Fall Seasonal Vaccine 85yrs & older 04/11/2023   Moderna Sars-Covid-2 Vaccination 07/20/2019, 08/17/2019, 04/19/2020, 06/01/2021   Pneumococcal Conjugate-13 04/14/2016   Pneumococcal Polysaccharide-23 06/30/2013   Td 06/03/2010   Zoster, Live 06/03/2010   Zoster, Unspecified 08/25/2022    TDAP status: Up to date  Flu Vaccine status: Up to date  Pneumococcal vaccine status: Up to date  Covid-19 vaccine status: Declined, Education has been provided regarding the importance of this vaccine but patient still declined. Advised may receive this vaccine at local pharmacy or Health Dept.or vaccine clinic. Aware to provide a copy of the vaccination record if obtained from local pharmacy or Health Dept. Verbalized acceptance and understanding.  Qualifies for Shingles Vaccine? Yes   Zostavax completed Yes   Shingrix Completed?: Yes  Screening Tests Health Maintenance  Topic Date Due   Hepatitis C Screening  Never done   Zoster Vaccines- Shingrix (1 of 2) 05/22/1967   COVID-19 Vaccine (6 - 2024-25 season) 06/06/2023   Medicare Annual Wellness (AWV)  06/19/2024   DTaP/Tdap/Td (4 - Tdap) 10/19/2032   Pneumonia Vaccine 52+ Years old  Completed   INFLUENZA VACCINE  Completed   HPV VACCINES  Aged Out   Colonoscopy   Discontinued    Health Maintenance  Health Maintenance Due  Topic Date Due   Hepatitis C Screening  Never done   Zoster Vaccines- Shingrix (1 of 2) 05/22/1967   COVID-19 Vaccine (6 - 2024-25 season) 06/06/2023       Additional Screening:  Hepatitis C Screening: does qualify;  Deferred  Vision Screening: Recommended annual ophthalmology exams for early detection of glaucoma and other disorders of the eye. Is the patient up to date with their annual eye exam?  Yes  Who is the provider or what is the name of the office in which the patient attends annual eye exams? Dr Sherrine Maples If pt is not established with a provider, would they like to be referred to a provider to establish care? No .   Dental Screening: Recommended annual dental exams for proper oral hygiene    Community Resource Referral / Chronic Care Management:  CRR required this visit?  No   CCM required this visit?  No     Plan:     I have personally reviewed and noted the following in the patient's chart:   Medical and social history Use of alcohol, tobacco  or illicit drugs  Current medications and supplements including opioid prescriptions. Patient is not currently taking opioid prescriptions. Functional ability and status Nutritional status Physical activity Advanced directives List of other physicians Hospitalizations, surgeries, and ER visits in previous 12 months Vitals Screenings to include cognitive, depression, and falls Referrals and appointments  In addition, I have reviewed and discussed with patient certain preventive protocols, quality metrics, and best practice recommendations. A written personalized care plan for preventive services as well as general preventive health recommendations were provided to patient.     Tillie Rung, LPN   [id number]   After Visit Summary: (MyChart) Due to this being a telephonic visit, the after visit summary with patients personalized plan was offered to  patient via MyChart   Nurse Notes: None

## 2023-06-20 NOTE — Patient Instructions (Addendum)
Patrick Sosa , Thank you for taking time to come for your Medicare Wellness Visit. I appreciate your ongoing commitment to your health goals. Please review the following plan we discussed and let me know if I can assist you in the future.   Referrals/Orders/Follow-Ups/Clinician Recommendations:   This is a list of the screening recommended for you and due dates:  Health Maintenance  Topic Date Due   Hepatitis C Screening  Never done   Zoster (Shingles) Vaccine (1 of 2) 05/22/1967   COVID-19 Vaccine (6 - 2024-25 season) 06/06/2023   Medicare Annual Wellness Visit  06/19/2024   DTaP/Tdap/Td vaccine (4 - Tdap) 10/19/2032   Pneumonia Vaccine  Completed   Flu Shot  Completed   HPV Vaccine  Aged Out   Colon Cancer Screening  Discontinued    Advanced directives: (In Chart) A copy of your advanced directives are scanned into your chart should your provider ever need it.  Next Medicare Annual Wellness Visit scheduled for next year: Yes

## 2023-07-06 ENCOUNTER — Other Ambulatory Visit: Payer: Self-pay | Admitting: Podiatry

## 2023-07-19 ENCOUNTER — Telehealth: Payer: Self-pay | Admitting: Cardiology

## 2023-07-19 DIAGNOSIS — Z7901 Long term (current) use of anticoagulants: Secondary | ICD-10-CM

## 2023-07-19 DIAGNOSIS — D682 Hereditary deficiency of other clotting factors: Secondary | ICD-10-CM

## 2023-07-19 MED ORDER — RIVAROXABAN 20 MG PO TABS: 20.0000 mg | ORAL_TABLET | Freq: Every day | ORAL | 1 refills | Status: DC

## 2023-07-19 NOTE — Telephone Encounter (Signed)
*  STAT* If patient is at the pharmacy, call can be transferred to refill team.   1. Which medications need to be refilled? (please list name of each medication and dose if known) rivaroxaban (XARELTO) 20 MG TABS tablet   2. Which pharmacy/location (including street and city if local pharmacy) is medication to be sent to? [id number] - [place], [place] - [address] AT [facility] OF [address]   3. Do they need a 30 day or 90 day supply? 90

## 2023-07-19 NOTE — Telephone Encounter (Signed)
Prescription refill request for Xarelto received.  Indication: DVT/ Factor V Leiden Last office visit: 08/28/22  Jon Billings MD Weight: 95.3kg Age: 78 Scr: 1.17 on 03/04/23  Epic CrCl: 73.53  Based on above findings Xarelto 20mg  daily is the appropriate dose.  Refill approved.

## 2023-08-15 ENCOUNTER — Other Ambulatory Visit: Payer: Self-pay

## 2023-08-15 DIAGNOSIS — I119 Hypertensive heart disease without heart failure: Secondary | ICD-10-CM

## 2023-08-15 DIAGNOSIS — Z79899 Other long term (current) drug therapy: Secondary | ICD-10-CM

## 2023-08-15 MED ORDER — VALSARTAN 80 MG PO TABS: 80.0000 mg | ORAL_TABLET | Freq: Every day | ORAL | 0 refills | Status: DC

## 2023-09-13 ENCOUNTER — Ambulatory Visit (INDEPENDENT_AMBULATORY_CARE_PROVIDER_SITE_OTHER): Admitting: Adult Health

## 2023-09-13 ENCOUNTER — Encounter: Payer: Self-pay | Admitting: Adult Health

## 2023-09-13 VITALS — BP 140/80 | HR 76 | Temp 98.3°F | Ht 72.0 in | Wt 210.0 lb

## 2023-09-13 DIAGNOSIS — M79671 Pain in right foot: Secondary | ICD-10-CM

## 2023-09-13 DIAGNOSIS — R600 Localized edema: Secondary | ICD-10-CM | POA: Diagnosis not present

## 2023-09-13 DIAGNOSIS — M79672 Pain in left foot: Secondary | ICD-10-CM | POA: Diagnosis not present

## 2023-09-13 LAB — URIC ACID: Uric Acid, Serum: 6.7 mg/dL (ref 4.0–7.8)

## 2023-09-13 MED ORDER — TRAMADOL HCL 50 MG PO TABS
50.0000 mg | ORAL_TABLET | Freq: Three times a day (TID) | ORAL | 0 refills | Status: AC | PRN
Start: 2023-09-13 — End: 2023-09-18

## 2023-09-13 NOTE — Patient Instructions (Signed)
 It was great seeing you today   I am going to check a lab for uric acid - which would be a sign of gout.   I am going to place you on Tramadol at night to see if this helps with the pain   Cymbalta - check with Cardiology if you can take this

## 2023-09-13 NOTE — Progress Notes (Signed)
 Subjective:    Patient ID: Patrick Sosa, male    DOB: 1946/06/21, 78 y.o.   MRN: [id number]  HPI 78 year old male who  has a past medical history of Chronic anticoagulation, Diverticulosis, DVT (deep venous thrombosis) (HCC), Factor V deficiency (HCC), adenomatous colonic polyps (08/20/2018), Hypercholesterolemia, Hypertension, Kidney disease, Kidney stones, Pneumonia, Polycythemia, and Prostate cancer (HCC).  He presents to the office today for a chronic visit. He has long history of bilateral foot pain and has seen podiatry for quite sometime to have custom inserts done.  He has significant pain around the MPJ joints bilaterally, left greater than right.  He also has noted digital deformities.  Reports that he was last seen by podiatry they prescribed him gabapentin 300 mg at bedtime.  He reports that podiatry told him that there is nothing else that could be done for his chronic foot pain.  Seems as though over the last 2 months she has been experiencing lower extremity edema bilaterally more so in his lower legs and ankles.  He is taking spironolactone as directed.  He has been taking his prescribed Xarelto.  No calf pain, redness, or tenderness   Review of Systems See HPI   Past Medical History:  Diagnosis Date   Chronic anticoagulation    Diverticulosis    DVT (deep venous thrombosis) (HCC)    Factor V deficiency (HCC)    Hx of adenomatous colonic polyps 08/20/2018   Hypercholesterolemia    Hypertension    Kidney disease    Stage III   Kidney stones    Pneumonia    Polycythemia    Prostate cancer [facility])     Social History   Socioeconomic History   Marital status: Married    Spouse name: Not on file   Number of children: 3   Years of education: Not on file   Highest education level: Bachelor's degree (e.g., BA, AB, BS)  Occupational History   Occupation: retired  Tobacco Use   Smoking status: Former    Current packs/day: 0.00    Types: Cigarettes    Quit date:  10/11/1981    Years since quitting: 41.9   Smokeless tobacco: Never  Vaping Use   Vaping status: Never Used  Substance and Sexual Activity   Alcohol use: No    Alcohol/week: 0.0 standard drinks of alcohol    Comment: rarely   Drug use: No   Sexual activity: Yes  Other Topics Concern   Not on file  Social History Narrative   Retired    - IT trainer    Married    Three children    Six grandchildren       He is active at [employer] D[employer] of [employer]: Low Risk  (09/12/2023)   Overall Financial Resource Strain (CARDIA)    Difficulty of Paying Living Expenses: Not hard at all  Food Insecurity: No Food Insecurity (09/12/2023)   Hunger Vital Sign    Worried About Running Out of Food in the Last Year: Never true    Ran Out of Food in the Last Year: Never true  Transportation Needs: No Transportation Needs (09/12/2023)   PRAPARE - Administrator, Civil Service (Medical): No    Lack of Transportation (Non-Medical): No  Physical Activity: Insufficiently Active (09/12/2023)   Exercise Vital Sign    Days of Exercise per Week: 3 days    Minutes of Exercise per Session: 20 min  Stress: No Stress Concern Present (09/12/2023)   [employer] of Occupational Health - Occupational Stress Questionnaire    Feeling of Stress : Not at all  Social Connections: Moderately Isolated (09/12/2023)   Social Connection and Isolation Panel [NHANES]    Frequency of Communication with Friends and Family: Once a week    Frequency of Social Gatherings with Friends and Family: Twice a week    Attends Religious Services: Never    Database administrator or Organizations: No    Attends Banker Meetings: Never    Marital Status: Married  Catering manager Violence: Not At Risk (06/20/2023)   Humiliation, Afraid, Rape, and Kick questionnaire    Fear of Current or Ex-Partner: No    Emotionally Abused: No    Physically Abused: No    Sexually Abused: No     Past Surgical History:  Procedure Laterality Date   CATARACT EXTRACTION, BILATERAL Bilateral 2018   COLONOSCOPY     INGUINAL HERNIA REPAIR Left 03/11/2023   Procedure: LAPAROSCOPIC POSSIBLE OPEN LEFT INGUINAL HERNIA REPAIR WITH MESH;  Surgeon: Stechschulte, Hyman Hopes, MD;  Location: [facility];  Service: General;  Laterality: Left;   ROBOT ASSISTED LAPAROSCOPIC RADICAL PROSTATECTOMY  10/06/2021   TONSILLECTOMY      Family History  Problem Relation Age of Onset   Alzheimer's disease Mother    Arthritis Mother    Pancreatic cancer Father    Hypertension Father    Heart attack Father    Hypertension Sister    Breast cancer Sister    Arthritis Sister    Rheum arthritis Daughter    Colon cancer Neg Hx    Stomach cancer Neg Hx    Rectal cancer Neg Hx    Liver cancer Neg Hx    Esophageal cancer Neg Hx     No Known Allergies  Current Outpatient Medications on File Prior to Visit  Medication Sig Dispense Refill   acetaminophen (TYLENOL) 500 MG tablet Take 1,000 mg by mouth at bedtime.     amLODipine (NORVASC) 10 MG tablet TAKE 1 TABLET(10 MG) BY MOUTH DAILY 90 tablet 1   gabapentin (NEURONTIN) 300 MG capsule TAKE 1 CAPSULE(300 MG) BY MOUTH THREE TIMES DAILY (Patient taking differently: Pt states he takes 1 tab at bedtime) 90 capsule 3   metoprolol succinate (TOPROL-XL) 25 MG 24 hr tablet TAKE 1 TABLET(25 MG) BY MOUTH DAILY 90 tablet 1   pravastatin (PRAVACHOL) 20 MG tablet Take 1 tablet (20 mg total) by mouth daily. 90 tablet 3   rivaroxaban (XARELTO) 20 MG TABS tablet Take 1 tablet (20 mg total) by mouth daily with supper. 90 tablet 1   spironolactone (ALDACTONE) 25 MG tablet Take 0.5 tablets (12.5 mg total) by mouth daily. 45 tablet 3   valsartan (DIOVAN) 80 MG tablet Take 1 tablet (80 mg total) by mouth daily. 90 tablet 0   No current facility-administered medications on file prior to visit.    BP (!) 140/80   Pulse 76   Temp 98.3 F (36.8 C) (Oral)   Ht 6' (1.829 m)   Wt 210  lb (95.3 kg)   SpO2 97%   BMI 28.48 kg/m       Objective:   Physical Exam Vitals and nursing note reviewed.  Constitutional:      Appearance: Normal appearance.  Cardiovascular:     Rate and Rhythm: Normal rate and regular rhythm.     Pulses: Normal pulses.  Dorsalis pedis pulses are 2+ on the right side and 2+ on the left side.       Posterior tibial pulses are 2+ on the right side and 2+ on the left side.     Heart sounds: Normal heart sounds.     Comments: Trace pitting edema noted from ankle to mid-shin bilaterally. No calf pain, redness or warmth noted.  Musculoskeletal:        General: Normal range of motion.     Right lower leg: Pitting Edema present.     Left lower leg: Pitting Edema present.     Right foot: Deformity and bunion present.     Left foot: Deformity and bunion present.  Feet:     Right foot:     Skin integrity: Erythema and warmth present. No skin breakdown.     Toenail Condition: Right toenails are abnormally thick. Fungal disease present.    Left foot:     Skin integrity: Erythema and warmth present. No skin breakdown.     Toenail Condition: Left toenails are abnormally thick and long. Fungal disease present. Skin:    General: Skin is warm and dry.  Neurological:     General: No focal deficit present.     Mental Status: He is alert and oriented to person, place, and time.  Psychiatric:        Mood and Affect: Mood normal.        Behavior: Behavior normal.        Thought Content: Thought content normal.        Judgment: Judgment normal.         Assessment & Plan:  1. Bilateral foot pain (Primary) - Will check Uric acid level today.  I would be open to using Cymbalta if okay by cardiology.  He will speak to cardiology next week about this.  Will try him on tramadol 50 mg, he is going to use this at nighttime to see how he tolerates the medication.  I am going to have him follow-up in 3 to 4 weeks - Uric Acid; Future - Uric Acid -  traMADol (ULTRAM) 50 MG tablet; Take 1 tablet (50 mg total) by mouth every 8 (eight) hours as needed for up to 5 days.  Dispense: 30 tablet; Refill: 0  2. Lower extremity edema - I believe this is in the setting of Gabapentin. Will have him stop this mediation at night.   Shirline Frees, NP  Time spent with patient today was 33 minutes which consisted of chart review, discussing  lower extremity edema, chronic foot pain, work up, treatment answering questions and documentation.

## 2023-09-18 NOTE — Progress Notes (Unsigned)
 Cardiology Office Note:  .   Date:  09/19/2023  ID:  Patrick Sosa, DOB 1945/07/24, MRN [id number] PCP: Shirline Frees, NP  [facility] Providers Cardiologist:  Bryan Lemma, MD     Chief Complaint  Patient presents with   Follow-up    Annual follow-up.  Doing well until this morning   Atrial Fibrillation    Fitbit this morning showed overnight heart rates between 37 and 155 bpm with irregular rhythm concerning for A-fib.  Several readings recorded throughout the course the evening and morning.    Patient Profile: .     Patrick Sosa is a  78 y.o. 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  He returns at the request of Shirline Frees, NP.  He was previously followed by Dr. Shari Prows for blood pressure management.    Patrick Sosa was last seen by Dr. Shari Prows in March for as an annual follow-up.  He is feeling well.  No chest pain or dyspnea.  No orthopnea or PND.  Systolic blood pressures are mostly in the 110-130 mmHg range.  He is tolerating medications including a previously increased dose of amlodipine.  No bleeding on Xarelto. => Was on amlodipine 10 mg daily, valsartan 80 mg daily, Toprol-XL 25 mg daily along with spironolactone 12.5 mg daily. => On Xarelto 20 mg daily maintenance along with pravastatin 20 mg daily for lipids. => History of PVCs, controlled on Toprol  Subjective  Discussed the use of AI scribe software for clinical note transcription with the patient, who gave verbal consent to proceed.  History of Present Illness   Patrick Sosa is a 78 year old male with h/o Factor V Leiden & DVT on Xarelto & HTN who presents for annual f/u with CC of with irregular heartbeats.  He has been experiencing irregular heartbeats, first noticed last week  when he woke up feeling 'funny' at around 5 AM. His Fitbit recorded heart rates fluctuating from the 30s to 150s. This was the first time he felt irregular heartbeats, and he has never experienced atrial fibrillation before. No chest pain, pressure, or shortness of breath occurred during the episode.  He is on multiple medications for blood pressure management, including valsartan 80 mg, spironolactone 25 mg, amlodipine 10 mg, and metoprolol 25 mg. His blood pressure fluctuates, sometimes reaching 160/80 by the end of the day. He has experienced swelling in his legs, particularly the right leg, which has improved since discontinuing gabapentin. He also has stage 3 kidney disease but is not diabetic.  He has a history of deep vein thrombosis and is currently on Xarelto 20 mg daily. He has not experienced any bleeding issues or blood in his stools while on Xarelto. He had a hernia surgery last fall where he was advised to hold Xarelto for a couple of days, and a prostate removal the year before where bridging was required.  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 He experiences pain and swelling in his feet, which limits his ability to walk. He uses a cart for support when walking in stores and engages in yard work and mowing the grass without experiencing chest pain.      Cardiovascular ROS: no chest pain or dyspnea on exertion positive for -  edema, irregular heartbeat, palpitations, rapid heart rate, and woke him up this morning negative for - orthopnea, paroxysmal nocturnal dyspnea, shortness of breath, or lightheadedness, dizziness or wooziness, syncope or near syncope, TIA or emesis fugax, claudication  ROS:  Review of Systems - Negative except Sx above -- pedal neuropathy    Objective   Medications DOAC- Xarelto 20 mg BP- Valsartan 80 mg; - Spironolactone 25 mg; - Amlodipine 10 mg; - Toprol 25 mg - Tramadol as needed for pain  Studies  Reviewed: Marland Kitchen   EKG Interpretation Date/Time:  Monday September 19 2023 08:39:43 EDT Ventricular Rate:  73 PR Interval:  178 QRS Duration:  102 QT Interval:  382 QTC Calculation: 420 R Axis:   -13  Text Interpretation: Normal sinus rhythm RSR' or QR pattern in V1 suggests right ventricular conduction delay Normal ECG When compared with ECG of 27-Apr-2011 16:13, No significant change since last tracing Confirmed by Bryan Lemma ([id number]) on 09/19/2023 8:43:58 AM    No prior studies Lab Results  Component Value Date   CHOL 133 09/17/2022   HDL 28 (L) 09/17/2022   LDLCALC 75 09/17/2022   LDLDIRECT 65.4 02/23/2012   TRIG 174 (H) 09/17/2022   CHOLHDL 4.8 09/17/2022   Lab Results  Component Value Date   NA 135 03/04/2023   K 4.1 03/04/2023   CREATININE 1.17 03/04/2023   GFRNONAA >60 03/04/2023   GLUCOSE 104 (H) 03/04/2023    Risk Assessment/Calculations:    CHA2DS2-VASc Score = 3   This indicates a 3.2% annual risk of stroke. The patient's score is based upon: CHF History: 0 HTN History: 1 Diabetes History: 0 Stroke History: 0 Vascular Disease History: 0 Age Score: 2 Gender Score: 0   ON XARELTO FOR DVT - FACTOR V LEIDEN HYPERTENSION CONTROL Vitals:   09/19/23 0836 09/19/23 0919  BP: (!) 140/72 (!) 150/80    The patient's blood pressure is elevated above target today.  In order to address the patient's elevated BP: A current anti-hypertensive medication was adjusted today. (Toprol increased to 50 mg daily)          Physical Exam:   VS:  BP (!) 150/80   Pulse 73   Ht 6' (1.829 m)   Wt 208 lb 9.6 oz (94.6 kg)   SpO2 97%   BMI 28.29 kg/m    Wt Readings from Last 3 Encounters:  09/19/23 208 lb 9.6 oz (94.6 kg)  09/13/23 210 lb (95.3 kg)  06/20/23 205 lb (93 kg)    GEN: Well nourished, well developed in no acute distress; healthy-appearing.  Brabham room. NECK: No JVD; No carotid bruits CARDIAC: RRR, Normal S1, S2; no murmurs, rubs, gallops RESPIRATORY:  Clear  to auscultation without rales, wheezing or rhonchi ; nonlabored, good air movement. ABDOMEN: Soft, non-tender, non-distended EXTREMITIES: 1+ RLE and trace LLE edema; No deformity      ASSESSMENT AND PLAN: .    Problem List Items Addressed This Visit       Cardiology Problems   Benign hypertensive heart disease without heart failure (Chronic)   He says his BP levels did go up in the afternoons.  Hopefully by increasing his Toprol dose that will help with the afternoon pressures. No PND or orthopnea.  Other than some mild pedal edema in the right leg, euvolemic.  Suggested that he can take an extra dose of spironolactone for edema      Relevant Medications   metoprolol succinate (TOPROL-XL) 50 MG 24 hr tablet   Essential  hypertension (Chronic)   Blood pressure is slightly elevated today, with home readings sometimes reaching 160/80 mmHg by the end of the day. He is on valsartan, spironolactone, amlodipine, and metoprolol for management. Increasing the metoprolol dose may help with control. - Increase metoprolol dose to 50 mg daily. - Recheck blood pressure before he leaves the clinic.      Relevant Medications   metoprolol succinate (TOPROL-XL) 50 MG 24 hr tablet   Hypercholesterolemia (Chronic)   On Pravachol/pravastatin with most recent LDL of 75 in March 2024.  Due for labs rechecked with PCP in April 2024-continue pravastatin 20 mg      Relevant Medications   metoprolol succinate (TOPROL-XL) 50 MG 24 hr tablet   PVC's (premature ventricular contractions); Hx of bigeminy (Chronic)   Distant history of PVCs but now with rapid heart rate spells. 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 He reports no bleeding issues or blood in stools. Management for surgeries and procedures varies based on anticoagulation interruption duration. Bridging is considered if prolonged immobility is anticipated post-procedure. - Continue Xarelto 20 mg daily. - Discuss bridging anticoagulation with healthcare providers as needed for future surgeries or procedures.      Irregular heart rhythm - Primary   He experienced irregular heart rates ranging from the 30s to 150s, as detected by his Fitbit, suggesting possible atrial fibrillation. He woke up feeling funny but did not experience chest pain, pressure, or shortness of breath. This is the first occurrence of such irregular heartbeats. He is on Xarelto for anticoagulation and metoprolol for rate control, providing stroke protection. Increasing the metoprolol dose may aid in heart rate and blood pressure control. - Order a 14-day Zio patch monitor to capture any episodes of atrial fibrillation. - Increase metoprolol dose to 50 mg daily. - Recommend obtaining a KardiaMobile device for self-monitoring heart rhythm. - If atrial fibrillation is confirmed on the monitor, order an echocardiogram to assess for structural heart issues.      Relevant Orders   LONG TERM MONITOR (3-14 DAYS)   Stage 3a chronic kidney disease (CKD) (HCC) (Chronic)   Creatinines was 1.17 on last check. Likely some routine CKD 2 to CKD 3.       Peripheral Neuropathy and Foot Pain He experiences significant foot pain and neuropathy, affecting mobility. Previously on gabapentin but discontinued due to side effects. Currently on tramadol for pain management, but there is a consideration for Cymbalta, which may interact with Xarelto. - Continue tramadol for pain management. - Avoid Cymbalta due to potential interaction with Xarelto.  Recording duration: 22 minutes      Follow-Up: Return in about 4 months (around  01/19/2024) for 3-4 month follow-up, Routine follow up with me, [employer].    Signed, Marykay Lex, MD, MS Bryan Lemma, M.D., M.S. Interventional Cardiologist  [facility]  Pager # [phone] Phone # [phone] [address] [place], [place] [address]

## 2023-09-19 ENCOUNTER — Ambulatory Visit: Attending: Cardiology

## 2023-09-19 ENCOUNTER — Ambulatory Visit: Payer: Medicare Other | Attending: Cardiology | Admitting: Cardiology

## 2023-09-19 ENCOUNTER — Encounter: Payer: Self-pay | Admitting: Cardiology

## 2023-09-19 VITALS — BP 150/80 | HR 73 | Ht 72.0 in | Wt 208.6 lb

## 2023-09-19 DIAGNOSIS — I10 Essential (primary) hypertension: Secondary | ICD-10-CM

## 2023-09-19 DIAGNOSIS — I499 Cardiac arrhythmia, unspecified: Secondary | ICD-10-CM | POA: Insufficient documentation

## 2023-09-19 DIAGNOSIS — I4510 Unspecified right bundle-branch block: Secondary | ICD-10-CM

## 2023-09-19 DIAGNOSIS — I119 Hypertensive heart disease without heart failure: Secondary | ICD-10-CM

## 2023-09-19 DIAGNOSIS — I493 Ventricular premature depolarization: Secondary | ICD-10-CM

## 2023-09-19 DIAGNOSIS — N1831 Chronic kidney disease, stage 3a: Secondary | ICD-10-CM

## 2023-09-19 DIAGNOSIS — D682 Hereditary deficiency of other clotting factors: Secondary | ICD-10-CM | POA: Diagnosis not present

## 2023-09-19 DIAGNOSIS — E7800 Pure hypercholesterolemia, unspecified: Secondary | ICD-10-CM

## 2023-09-19 MED ORDER — METOPROLOL SUCCINATE ER 50 MG PO TB24: 50.0000 mg | ORAL_TABLET | Freq: Every day | ORAL | 3 refills | Status: DC

## 2023-09-19 NOTE — Assessment & Plan Note (Signed)
 Distant history of PVCs but now with rapid heart rate spells. -Check 14-day Zio patch monitor -Recommend Kardia-Mobile for future diagnosis

## 2023-09-19 NOTE — Assessment & Plan Note (Signed)
 He has Factor V Leiden and DVT, managed with Xarelto 20 mg. He reports no bleeding issues or blood in stools. Management for surgeries and procedures varies based on anticoagulation interruption duration. Bridging is considered if prolonged immobility is anticipated post-procedure. - Continue Xarelto 20 mg daily. - Discuss bridging anticoagulation with healthcare providers as needed for future surgeries or procedures.

## 2023-09-19 NOTE — Assessment & Plan Note (Signed)
 He says his BP levels did go up in the afternoons.  Hopefully by increasing his Toprol dose that will help with the afternoon pressures. No PND or orthopnea.  Other than some mild pedal edema in the right leg, euvolemic.  Suggested that he can take an extra dose of spironolactone for edema

## 2023-09-19 NOTE — Assessment & Plan Note (Signed)
 Creatinines was 1.17 on last check. Likely some routine CKD 2 to CKD 3.

## 2023-09-19 NOTE — Assessment & Plan Note (Signed)
 He experienced irregular heart rates ranging from the 30s to 150s, as detected by his Fitbit, suggesting possible atrial fibrillation. He woke up feeling funny but did not experience chest pain, pressure, or shortness of breath. This is the first occurrence of such irregular heartbeats. He is on Xarelto for anticoagulation and metoprolol for rate control, providing stroke protection. Increasing the metoprolol dose may aid in heart rate and blood pressure control. - Order a 14-day Zio patch monitor to capture any episodes of atrial fibrillation. - Increase metoprolol dose to 50 mg daily. - Recommend obtaining a KardiaMobile device for self-monitoring heart rhythm. - If atrial fibrillation is confirmed on the monitor, order an echocardiogram to assess for structural heart issues.

## 2023-09-19 NOTE — Patient Instructions (Addendum)
 Medication Instructions:   INCREASE TOPROL (METOPROLOL SUCCINATE) TO 50 MG  DAILY  *If you need a refill on your cardiac medications before your next appointment, please call your pharmacy*   Lab Work:  NOT NEEDED   Testing/Procedures:  WILL BE MAILED TO YOUR HOME IN 3 TO 7 DAYS Your physician has recommended that you wear a holter monitor  14 DAY ZIO. Holter monitors are medical devices that record the heart's electrical activity. Doctors most often use these monitors to diagnose arrhythmias. Arrhythmias are problems with the speed or rhythm of the heartbeat. The monitor is a small, portable device. You can wear one while you do your normal daily activities. This is usually used to diagnose what is causing palpitations/syncope (passing out).   Follow-Up: At [facility], you and your health needs are our priority.  As part of our continuing mission to provide you with exceptional heart care, we have created designated Provider Care Teams.  These Care Teams include your primary Cardiologist (physician) and Advanced Practice Providers (APPs -  Physician Assistants and Nurse Practitioners) who all work together to provide you with the care you need, when you need it.     Your next appointment:   4 month(s)  The format for your next appointment:   In Person  Provider:   Bryan Lemma, MD    Other Instructions   ZIO XT- Long Term Monitor Instructions  Your physician has requested you wear a ZIO patch monitor for 14 days.  This is a single patch monitor. [employer] supplies one patch monitor per enrollment. Additional stickers are not available. Please do not apply patch if you will be having a Nuclear Stress Test,  Echocardiogram, Cardiac CT, MRI, or Chest Xray during the period you would be wearing the  monitor. The patch cannot be worn during these tests. You cannot remove and re-apply the  ZIO XT patch monitor.  Your ZIO patch monitor will be mailed 3 day USPS to your address  on file. It may take 3-5 days  to receive your monitor after you have been enrolled.  Once you have received your monitor, please review the enclosed instructions. Your monitor  has already been registered assigning a specific monitor serial # to you.  Billing and Patient Assistance Program Information  We have supplied [employer] with any of your insurance information on file for billing purposes. [employer] offers a sliding scale Patient Assistance Program for patients that do not have  insurance, or whose insurance does not completely cover the cost of the ZIO monitor.  You must apply for the Patient Assistance Program to qualify for this discounted rate.  To apply, please call [employer] at [phone], select option 4, select option 2, ask to apply for  Patient Assistance Program. Meredeth Ide will ask your household income, and how many people  are in your household. They will quote your out-of-pocket cost based on that information.  [employer] will also be able to set up a 16-month, interest-free payment plan if needed.  Applying the monitor   Shave hair from upper left chest.  Hold abrader disc by orange tab. Rub abrader in 40 strokes over the upper left chest as  indicated in your monitor instructions.  Clean area with 4 enclosed alcohol pads. Let dry.  Apply patch as indicated in monitor instructions. Patch will be placed under collarbone on left  side of chest with arrow pointing upward.  Rub patch adhesive wings for 2 minutes. Remove white label marked "1". Remove the  white  label marked "2". Rub patch adhesive wings for 2 additional minutes.  While looking in a mirror, press and release button in center of patch. A small green light will  flash 3-4 times. This will be your only indicator that the monitor has been turned on.  Do not shower for the first 24 hours. You may shower after the first 24 hours.  Press the button if you feel a symptom. You will hear a small click. Record Date, Time  and  Symptom in the Patient Logbook.  When you are ready to remove the patch, follow instructions on the last 2 pages of Patient  Logbook. Stick patch monitor onto the last page of Patient Logbook.  Place Patient Logbook in the blue and white box. Use locking tab on box and tape box closed  securely. The blue and white box has prepaid postage on it. Please place it in the mailbox as  soon as possible. Your physician should have your test results approximately 7 days after the  monitor has been mailed back to [facility].  Call [facility] Customer Care at [phone] if you have questions regarding  your ZIO XT patch monitor. Call them immediately if you see an orange light blinking on your  monitor.  If your monitor falls off in less than 4 days, contact our Monitor department at [phone].  If your monitor becomes loose or falls off after 4 days call [employer] at [phone] for  suggestions on securing your monitor      RECOMMEND YOU PURCHASE KardiaMobile [email or web address]        FDA-cleared, clinical grade mobile EKG monitor: Lourena Simmonds is the most clinically-validated mobile EKG used by the world's leading cardiac care medical professionals With Basic service, know instantly if your heart rhythm is normal or if atrial fibrillation is detected, and email the last single EKG recording to yourself or your doctor Premium service, available for purchase through the Kardia app for $9.99 per month or $99 per year, includes unlimited history and storage of your EKG recordings, a monthly EKG summary report to share with your doctor, along with the ability to track your blood pressure, activity and weight Includes one KardiaMobile phone clip FREE SHIPPING: Standard delivery 1-3 business days. Orders placed by 11:00am PST will ship that afternoon. Otherwise, will ship next business day. All orders ship via [employer] from [place], [place]    [employer] - sending an EKG Download app and set up profile. Run EKG - by placing 1-2 fingers on the silver plates After EKG is complete - Download PDF  - Skip password (if you apply a password the provider will need it to view the EKG) Click share button (square with upward arrow) in bottom left corner To send: choose MyChart (first time log into MyChart)  Pop up window about sending ECG Click continue Choose type of message Choose provider Type subject and message Click send (EKG should be attached)  - To send additional EKGs in one message click the paperclip image and bottom of page to attach.      s

## 2023-09-19 NOTE — Assessment & Plan Note (Signed)
 On Pravachol/pravastatin with most recent LDL of 75 in March 2024.  Due for labs rechecked with PCP in April 2024-continue pravastatin 20 mg

## 2023-09-19 NOTE — Progress Notes (Unsigned)
 Enrolled for [employer] to mail a ZIO XT long term holter monitor to the patients address on file.

## 2023-09-19 NOTE — Assessment & Plan Note (Signed)
 Blood pressure is slightly elevated today, with home readings sometimes reaching 160/80 mmHg by the end of the day. He is on valsartan, spironolactone, amlodipine, and metoprolol for management. Increasing the metoprolol dose may help with control. - Increase metoprolol dose to 50 mg daily. - Recheck blood pressure before he leaves the clinic.

## 2023-10-04 ENCOUNTER — Other Ambulatory Visit: Payer: Self-pay

## 2023-10-04 DIAGNOSIS — I119 Hypertensive heart disease without heart failure: Secondary | ICD-10-CM

## 2023-10-04 DIAGNOSIS — Z79899 Other long term (current) drug therapy: Secondary | ICD-10-CM

## 2023-10-04 MED ORDER — METOPROLOL SUCCINATE ER 50 MG PO TB24: 50.0000 mg | ORAL_TABLET | Freq: Every day | ORAL | 3 refills | Status: DC

## 2023-10-04 MED ORDER — PRAVASTATIN SODIUM 20 MG PO TABS: 20.0000 mg | ORAL_TABLET | Freq: Every day | ORAL | 3 refills | Status: DC

## 2023-10-04 MED ORDER — VALSARTAN 80 MG PO TABS: 80.0000 mg | ORAL_TABLET | Freq: Every day | ORAL | 3 refills | Status: DC

## 2023-10-04 MED ORDER — AMLODIPINE BESYLATE 10 MG PO TABS: 10.0000 mg | ORAL_TABLET | Freq: Every day | ORAL | 3 refills | Status: AC

## 2023-10-04 MED ORDER — SPIRONOLACTONE 25 MG PO TABS: 12.5000 mg | ORAL_TABLET | Freq: Every day | ORAL | 3 refills | Status: DC

## 2023-10-04 MED ORDER — PRAVASTATIN SODIUM 20 MG PO TABS: 20.0000 mg | ORAL_TABLET | Freq: Every day | ORAL | 3 refills | Status: AC

## 2023-10-04 NOTE — Addendum Note (Signed)
 Addended by: Gayleen Kawasaki D on: 10/04/2023 12:16 PM   Modules accepted: Orders

## 2023-10-05 ENCOUNTER — Encounter: Payer: Self-pay | Admitting: Cardiology

## 2023-10-06 NOTE — Telephone Encounter (Signed)
 Glad to see that you got it. Try to see if we can get a stable line so that is not moving up and down.  Is easy to read at that point.  Randene Bustard, MD

## 2023-10-13 ENCOUNTER — Ambulatory Visit (INDEPENDENT_AMBULATORY_CARE_PROVIDER_SITE_OTHER): Admitting: Adult Health

## 2023-10-13 ENCOUNTER — Encounter: Payer: Self-pay | Admitting: Adult Health

## 2023-10-13 VITALS — BP 138/64 | HR 67 | Temp 97.9°F | Ht 72.0 in | Wt 203.0 lb

## 2023-10-13 DIAGNOSIS — I10 Essential (primary) hypertension: Secondary | ICD-10-CM | POA: Diagnosis not present

## 2023-10-13 DIAGNOSIS — M79671 Pain in right foot: Secondary | ICD-10-CM

## 2023-10-13 DIAGNOSIS — Z8546 Personal history of malignant neoplasm of prostate: Secondary | ICD-10-CM

## 2023-10-13 DIAGNOSIS — E7800 Pure hypercholesterolemia, unspecified: Secondary | ICD-10-CM

## 2023-10-13 DIAGNOSIS — Z0000 Encounter for general adult medical examination without abnormal findings: Secondary | ICD-10-CM

## 2023-10-13 DIAGNOSIS — D682 Hereditary deficiency of other clotting factors: Secondary | ICD-10-CM

## 2023-10-13 DIAGNOSIS — N1831 Chronic kidney disease, stage 3a: Secondary | ICD-10-CM

## 2023-10-13 DIAGNOSIS — M79672 Pain in left foot: Secondary | ICD-10-CM

## 2023-10-13 LAB — LIPID PANEL
Cholesterol: 104 mg/dL (ref 0–200)
HDL: 27.7 mg/dL — ABNORMAL LOW (ref >39.00)
LDL Cholesterol: 44 mg/dL (ref 0–99)
NonHDL: 75.97
Total CHOL/HDL Ratio: 4
Triglycerides: 159 mg/dL — ABNORMAL HIGH (ref 0.0–149.0)
VLDL: 31.8 mg/dL (ref 0.0–40.0)

## 2023-10-13 LAB — COMPREHENSIVE METABOLIC PANEL WITH GFR
ALT: 21 U/L (ref 0–53)
AST: 29 U/L (ref 0–37)
Albumin: 4.6 g/dL (ref 3.5–5.2)
Alkaline Phosphatase: 56 U/L (ref 39–117)
BUN: 19 mg/dL (ref 6–23)
CO2: 28 meq/L (ref 19–32)
Calcium: 9.8 mg/dL (ref 8.4–10.5)
Chloride: 101 meq/L (ref 96–112)
Creatinine, Ser: 1.35 mg/dL (ref 0.40–1.50)
GFR: 51.4 mL/min — ABNORMAL LOW (ref >60.00)
Glucose, Bld: 112 mg/dL — ABNORMAL HIGH (ref 70–99)
Potassium: 4.2 meq/L (ref 3.5–5.1)
Sodium: 135 meq/L (ref 135–145)
Total Bilirubin: 1 mg/dL (ref 0.2–1.2)
Total Protein: 8.1 g/dL (ref 6.0–8.3)

## 2023-10-13 LAB — CBC
HCT: 45.3 % (ref 39.0–52.0)
Hemoglobin: 15.6 g/dL (ref 13.0–17.0)
MCHC: 34.3 g/dL (ref 30.0–36.0)
MCV: 102.7 fl — ABNORMAL HIGH (ref 78.0–100.0)
Platelets: 172 10*3/uL (ref 150.0–400.0)
RBC: 4.41 Mil/uL (ref 4.22–5.81)
RDW: 11.8 % (ref 11.5–15.5)
WBC: 7.1 10*3/uL (ref 4.0–10.5)

## 2023-10-13 LAB — TSH: TSH: 2.32 u[IU]/mL (ref 0.35–5.50)

## 2023-10-13 MED ORDER — TRAMADOL HCL 50 MG PO TABS: 50.0000 mg | ORAL_TABLET | Freq: Every day | ORAL | 2 refills | Status: DC

## 2023-10-13 NOTE — Progress Notes (Signed)
   Subjective:    Patient ID: Patrick Sosa, male    DOB: 1946-03-13, 78 y.o.   MRN: [id number]  HPI    Review of Systems     Objective:   Physical Exam        Assessment & Plan:

## 2023-10-13 NOTE — Progress Notes (Signed)
 Subjective:    Patient ID: Patrick Sosa, male    DOB: [birth year], 78 y.o.   MRN: [id number]  HPI Patient presents for yearly preventative medicine examination. 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  HTN -managed by cardiology.  Prescribed Norvasc  10 mg,  Toprol  50  mg XL daily, Valsartan   80 mg daily,  and spironolactone  25 mg daily.  He does monitor his blood pressures at home and reports readings that are WNL in the morning and then go up throughout the day and can be upwards of 160/80 in the evening. Cardiology recently increased Toprol  dose to 50 mg daily.   He denies chest pain, shortness of breath, headaches, blurred vision, or syncopal episodes Wt Readings from Last 3 Encounters:  10/13/23 203 lb (92.1 kg)  09/19/23 208 lb 9.6 oz (94.6 kg)  09/13/23 210 lb (95.3 kg)   Hyperlipidemia-currently managed with pravastatin  20 mg daily. Aaron Aas  He denies myalgia or fatigue Lab Results  Component Value Date   CHOL 133 09/17/2022   HDL 28 (L) 09/17/2022   LDLCALC 75 09/17/2022   LDLDIRECT 65.4 02/23/2012   TRIG 174 (H) 09/17/2022   CHOLHDL 4.8 09/17/2022    History of Prostate Cancer -PSA level prior to biopsy on July 18, 2020 was 5.82 compared to baseline of approximately 3.41.  His prostate biopsy revealed Gleason 7 disease in 1 core.  He pursued focal nano knife treatment on March 04, 2021 at [facility].  He was found to have recurrent Gleason 8 prostate cancer after preserving focal needle-knife therapy and after discussing treatments he decided on definitive full gland treatment with robotic prostatectomy 10/06/2021.   He sees urology every 6 months and his PSA's have been 0.00 since  prostatectomy.   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  He is on life long anticoagulation with Xarelto   CKD stage 3A Lab Results  Component Value Date   NA 135 03/04/2023   CL 101 03/04/2023   K 4.1 03/04/2023   CO2 25 03/04/2023   BUN 25 (H) 03/04/2023   CREATININE 1.17 03/04/2023   GFRNONAA >60 03/04/2023   CALCIUM 9.5 03/04/2023   ALBUMIN 4.5 04/01/2020   GLUCOSE 104 (H) 03/04/2023   Bilateral Foot Pain - He has long history of bilateral foot pain and has seen podiatry for quite sometime to have custom inserts done.  He has significant pain around the MPJ joints bilaterally, left greater than right.  He also has noted digital deformities.  Reports that he was last seen by podiatry they prescribed him gabapentin  300 mg at bedtime.  He reports that podiatry told him that there is nothing else that could be done for his chronic foot pain.  When he was seen about a month ago he reported that the gabapentin  did not help his pain at all and he had developed some lower extremity edema.  I had him stop gabapentin  and prescribed him tramadol  50 mg nightly.  Since starting tramadol  he reports significant improvement in his pain, he will take 1 tab at bedtime and if he has any further discomfort during the day it is relieved with Tylenol .  His lower extremity edema has also resolved.  He denies any side effects of tramadol   All immunizations and health maintenance protocols were reviewed with the patient and needed orders were placed.  Appropriate screening laboratory values were ordered for the patient including screening of hyperlipidemia, renal function and hepatic function. If indicated by BPH, a PSA was ordered.  Medication reconciliation,  past medical history, social history, problem list and allergies were reviewed in detail with the patient  Goals were established with regard to weight loss, exercise, and  diet in compliance with medications  Wt Readings  from Last 3 Encounters:  10/13/23 203 lb (92.1 kg)  09/19/23 208 lb 9.6 oz (94.6 kg)  09/13/23 210 lb (95.3 kg)    Review of Systems  Constitutional: Negative.   HENT: Negative.    Eyes: Negative.   Respiratory: Negative.    Cardiovascular: Negative.   Gastrointestinal: Negative.   Endocrine: Negative.   Genitourinary: Negative.   Musculoskeletal:  Positive for arthralgias.  Skin: Negative.   Allergic/Immunologic: Negative.   Neurological: Negative.   Hematological: Negative.   Psychiatric/Behavioral: Negative.    All other systems reviewed and are negative.    Past Medical History:  Diagnosis Date   Chronic anticoagulation    Diverticulosis    DVT (deep venous thrombosis) (HCC)    Factor V deficiency (HCC)    Hx of adenomatous colonic polyps 08/20/2018   Hypercholesterolemia    Hypertension    Kidney disease    Stage III   Kidney stones    Pneumonia    Polycythemia    Prostate cancer [facility])     Social History   Socioeconomic History   Marital status: Married    Spouse name: Not on file   Number of children: 3   Years of education: Not on file   Highest education level: Bachelor's degree (e.g., BA, AB, BS)  Occupational History   Occupation: retired  Tobacco Use   Smoking status: Former    Current packs/day: 0.00    Types: Cigarettes    Quit date: 10/11/1981    Years since quitting: 42.0   Smokeless tobacco: Never  Vaping Use   Vaping status: Never Used  Substance and Sexual Activity   Alcohol use: No    Alcohol/week: 0.0 standard drinks of alcohol    Comment: rarely   Drug use: No   Sexual activity: Yes  Other Topics Concern   Not on file  Social History Narrative   Retired    - IT trainer    Married    Three children    Six grandchildren       He is active at [employer] of [employer]: Low Risk  (09/12/2023)   Overall Financial Resource Strain (CARDIA)    Difficulty of Paying Living Expenses: Not hard at  all  Food Insecurity: No Food Insecurity (09/12/2023)   Hunger Vital Sign    Worried About Running Out of Food in the Last Year: Never true    Ran Out of Food in the Last Year: Never true  Transportation Needs: No Transportation Needs (09/12/2023)   PRAPARE - Administrator, Civil Service (Medical): No    Lack of Transportation (Non-Medical): No  Physical Activity: Insufficiently Active (09/12/2023)   Exercise Vital Sign    Days of Exercise per Week: 3 days    Minutes of Exercise per Session: 20 min  Stress: No Stress Concern Present (09/12/2023)   [employer] of Occupational Health - Occupational Stress Questionnaire    Feeling of Stress :  Not at all  Social Connections: Moderately Isolated (09/12/2023)   Social Connection and Isolation Panel [NHANES]    Frequency of Communication with Friends and Family: Once a week    Frequency of Social Gatherings with Friends and Family: Twice a week    Attends Religious Services: Never    Database administrator or Organizations: No    Attends Banker Meetings: Never    Marital Status: Married  Catering manager Violence: Not At Risk (06/20/2023)   Humiliation, Afraid, Rape, and Kick questionnaire    Fear of Current or Ex-Partner: No    Emotionally Abused: No    Physically Abused: No    Sexually Abused: No    Past Surgical History:  Procedure Laterality Date   CATARACT EXTRACTION, BILATERAL Bilateral 2018   COLONOSCOPY     INGUINAL HERNIA REPAIR Left 03/11/2023   Procedure: LAPAROSCOPIC POSSIBLE OPEN LEFT INGUINAL HERNIA REPAIR WITH MESH;  Surgeon: Stechschulte, Avon Boers, MD;  Location: [facility];  Service: General;  Laterality: Left;   ROBOT ASSISTED LAPAROSCOPIC RADICAL PROSTATECTOMY  10/06/2021   TONSILLECTOMY      Family History  Problem Relation Age of Onset   Alzheimer's disease Mother    Arthritis Mother    Pancreatic cancer Father    Hypertension Father    Heart attack Father    Hypertension Sister     Breast cancer Sister    Arthritis Sister    Rheum arthritis Daughter    Colon cancer Neg Hx    Stomach cancer Neg Hx    Rectal cancer Neg Hx    Liver cancer Neg Hx    Esophageal cancer Neg Hx     No Known Allergies  Current Outpatient Medications on File Prior to Visit  Medication Sig Dispense Refill   amLODipine  (NORVASC ) 10 MG tablet Take 1 tablet (10 mg total) by mouth daily. 90 tablet 3   metoprolol  succinate (TOPROL -XL) 50 MG 24 hr tablet Take 1 tablet (50 mg total) by mouth daily. Take with or immediately following a meal. 90 tablet 3   pravastatin  (PRAVACHOL ) 20 MG tablet Take 1 tablet (20 mg total) by mouth daily. 90 tablet 3   rivaroxaban  (XARELTO ) 20 MG TABS tablet Take 1 tablet (20 mg total) by mouth daily with supper. 90 tablet 1   spironolactone  (ALDACTONE ) 25 MG tablet Take 0.5 tablets (12.5 mg total) by mouth daily. 45 tablet 3   valsartan  (DIOVAN ) 80 MG tablet Take 1 tablet (80 mg total) by mouth daily. 90 tablet 3   No current facility-administered medications on file prior to visit.    BP 138/64   Pulse 67   Temp 97.9 F (36.6 C) (Oral)   Ht 6' (1.829 m)   Wt 203 lb (92.1 kg)   SpO2 97%   BMI 27.53 kg/m       Objective:   Physical Exam Vitals and nursing note reviewed.  Constitutional:      General: He is not in acute distress.    Appearance: Normal appearance. He is not ill-appearing.  HENT:     Head: Normocephalic and atraumatic.     Right Ear: Tympanic membrane, ear canal and external ear normal. There is no impacted cerumen.     Left Ear: Tympanic membrane, ear canal and external ear normal. There is no impacted cerumen.     Nose: Nose normal. No congestion or rhinorrhea.     Mouth/Throat:     Mouth: Mucous membranes are moist.  Pharynx: Oropharynx is clear.  Eyes:     Extraocular Movements: Extraocular movements intact.     Conjunctiva/sclera: Conjunctivae normal.     Pupils: Pupils are equal, round, and reactive to light.  Neck:      Vascular: No carotid bruit.  Cardiovascular:     Rate and Rhythm: Normal rate and regular rhythm.     Pulses: Normal pulses.     Heart sounds: No murmur heard.    No friction rub. No gallop.  Pulmonary:     Effort: Pulmonary effort is normal.     Breath sounds: Normal breath sounds.  Abdominal:     General: Abdomen is flat. Bowel sounds are normal. There is no distension.     Palpations: Abdomen is soft. There is no mass.     Tenderness: There is no abdominal tenderness. There is no guarding or rebound.     Hernia: No hernia is present.  Musculoskeletal:        General: Normal range of motion.     Cervical back: Normal range of motion and neck supple.  Lymphadenopathy:     Cervical: No cervical adenopathy.  Skin:    General: Skin is warm and dry.     Capillary Refill: Capillary refill takes less than 2 seconds.  Neurological:     General: No focal deficit present.     Mental Status: He is alert and oriented to person, place, and time.  Psychiatric:        Mood and Affect: Mood normal.        Behavior: Behavior normal.        Thought Content: Thought content normal.        Judgment: Judgment normal.       Assessment & Plan:  1. Routine general medical examination at a health care facility (Primary) Today patient counseled on age appropriate routine health concerns for screening and prevention, each reviewed and up to date or declined. Immunizations reviewed and up to date or declined. Labs ordered and reviewed. Risk factors for depression reviewed and negative. Hearing function and visual acuity are intact. ADLs screened and addressed as needed. Functional ability and level of safety reviewed and appropriate. Education, counseling and referrals performed based on assessed risks today. Patient provided with a copy of personalized plan for preventive services. - Stay active and eat healthy  - Follow up in one year or sooner  - Routine vaccinations at pharmacy   2. Essential  hypertension - Per Cardiology plan  - Lipid panel; Future - TSH; Future - CBC; Future - Comprehensive metabolic panel with GFR; Future - Comprehensive metabolic panel with GFR - CBC - TSH - Lipid panel  3. Hypercholesterolemia - Consider dose change of statin  - Lipid panel; Future - TSH; Future - CBC; Future - Comprehensive metabolic panel with GFR; Future - Comprehensive metabolic panel with GFR - CBC - TSH - Lipid panel  4. Bilateral foot pain - Continue with Tramadol   - Lipid panel; Future - TSH; Future - CBC; Future - Comprehensive metabolic panel with GFR; Future - Comprehensive metabolic panel with GFR - CBC - TSH - Lipid panel - traMADol  (ULTRAM ) 50 MG tablet; Take 1 tablet (50 mg total) by mouth at bedtime.  Dispense: 30 tablet; Refill: 2  5. Stage 3a chronic kidney disease (CKD) (HCC) - Continue to monitor  - Stay hydrated. Avoid sodium as much as possible  - Lipid panel; Future - TSH; Future - CBC; Future - Comprehensive metabolic panel with  GFR; Future - Comprehensive metabolic panel with GFR - CBC - TSH - Lipid panel  6. Factor V deficiency (HCC); Hx of DVT (on long term anticoagulation) - Continue with Xarelto  daily  - Lipid panel; Future - TSH; Future - CBC; Future - Comprehensive metabolic panel with GFR; Future - Comprehensive metabolic panel with GFR - CBC - TSH - Lipid panel  7. History of prostate cancer - Per urology  - Lipid panel; Future - TSH; Future - CBC; Future - Comprehensive metabolic panel with GFR; Future - Comprehensive metabolic panel with GFR - CBC - TSH - Lipid panel  Alto Atta, NP

## 2024-01-01 ENCOUNTER — Other Ambulatory Visit: Payer: Self-pay | Admitting: Cardiology

## 2024-01-01 DIAGNOSIS — Z7901 Long term (current) use of anticoagulants: Secondary | ICD-10-CM

## 2024-01-01 DIAGNOSIS — D682 Hereditary deficiency of other clotting factors: Secondary | ICD-10-CM

## 2024-01-02 NOTE — Telephone Encounter (Signed)
 Prescription refill request for Xarelto  received.  Indication: AF Last office visit:  09/19/23  JONETTA Clay MD Weight: 94.6kg Age: 78 Scr: 1.35 on 10/13/23  Epic CrCl: 63.26  Based on above findings Xarelto  20mg  daily is the appropriate dose.  Refill approved.

## 2024-01-08 ENCOUNTER — Other Ambulatory Visit: Payer: Self-pay | Admitting: Adult Health

## 2024-01-08 DIAGNOSIS — M79671 Pain in right foot: Secondary | ICD-10-CM

## 2024-01-15 ENCOUNTER — Ambulatory Visit: Payer: Self-pay | Admitting: Cardiology

## 2024-01-15 DIAGNOSIS — I493 Ventricular premature depolarization: Secondary | ICD-10-CM

## 2024-01-15 DIAGNOSIS — I499 Cardiac arrhythmia, unspecified: Secondary | ICD-10-CM | POA: Diagnosis not present

## 2024-01-18 ENCOUNTER — Ambulatory Visit: Attending: Cardiology | Admitting: Cardiology

## 2024-01-18 ENCOUNTER — Encounter: Payer: Self-pay | Admitting: Cardiology

## 2024-01-18 VITALS — BP 130/70 | HR 65 | Ht 72.0 in | Wt 193.0 lb

## 2024-01-18 DIAGNOSIS — I119 Hypertensive heart disease without heart failure: Secondary | ICD-10-CM

## 2024-01-18 DIAGNOSIS — I10 Essential (primary) hypertension: Secondary | ICD-10-CM

## 2024-01-18 DIAGNOSIS — D682 Hereditary deficiency of other clotting factors: Secondary | ICD-10-CM | POA: Diagnosis not present

## 2024-01-18 DIAGNOSIS — I493 Ventricular premature depolarization: Secondary | ICD-10-CM | POA: Diagnosis not present

## 2024-01-18 DIAGNOSIS — I4510 Unspecified right bundle-branch block: Secondary | ICD-10-CM | POA: Diagnosis not present

## 2024-01-18 DIAGNOSIS — Z79899 Other long term (current) drug therapy: Secondary | ICD-10-CM

## 2024-01-18 DIAGNOSIS — E7800 Pure hypercholesterolemia, unspecified: Secondary | ICD-10-CM

## 2024-01-18 MED ORDER — METOPROLOL SUCCINATE ER 50 MG PO TB24: 50.0000 mg | ORAL_TABLET | Freq: Every day | ORAL | Status: AC

## 2024-01-18 MED ORDER — VALSARTAN 80 MG PO TABS: 80.0000 mg | ORAL_TABLET | Freq: Every morning | ORAL | Status: DC

## 2024-01-18 NOTE — Patient Instructions (Addendum)
 Medication Instructions:   CHANGE TAKING MEDICATION    TAKE METOPROLOL  XL  50 MG  AT BEDTIME  TAKE VALSARTAN  80 MG IN THE MORNING   CHECK YOUR BLOOD PRESSURE FOR THE NEXT 2 WEEKS - RECORD READING , IF EVENING BLOOD PRESSURE NUMBERS CONTINUE TO BE ELEVATED . CONTACT OFFICE - - DR HARDING WILL INCREASE ONE OF YOUR MEDICATIONS ( VALSARTAN )    YO MAY SEND INFORMATION THROUGH MYCHART  *If you need a refill on your cardiac medications before your next appointment, please call your pharmacy*   Lab Work:  NOT NEEDED   Testing/Procedures:  NOT NEEDED  Follow-Up: At [facility], you and your health needs are our priority.  As part of our continuing mission to provide you with exceptional heart care, we have created designated Provider Care Teams.  These Care Teams include your primary Cardiologist (physician) and Advanced Practice Providers (APPs -  Physician Assistants and Nurse Practitioners) who all work together to provide you with the care you need, when you need it.     Your next appointment:   6 month(s)  The format for your next appointment:   In Person  Provider:   Damien Braver, NP or Katlyn West, NP      Then, [name], MD will plan to see you again in 12 month(s).

## 2024-01-18 NOTE — Progress Notes (Signed)
 Cardiology Office Note:  .   Date:  01/23/2024  ID:  Patrick Sosa, DOB 07/29/1945, MRN [id number] PCP: Merna Huxley, NP  [facility] Providers Cardiologist:  [name], MD     Chief Complaint  Patient presents with   Follow-up    Blood pressure follow-up; discuss results of monitor    Patient Profile: .     Patrick Sosa is a 78 y.o. male  with a PMH notable for HTN (mild hypertensive heart disease), factor V Leiden deficiency (history of DVT on Xarelto ), HLD, prostate cancer, and polycythemia who presents here for evaluation of palpitations and hypertension.    Patrick Sosa was previously followed by [name] for blood pressure management.  He.was seen on 09/19/2023 2 establish new cardiology care.  He returns at the request of Merna Huxley, NP.    Patrick Sosa was last seen on September 19, 2023 to reestablish cardiology care.  Noted his blood pressure to go up in the afternoon-we increase Toprol  to 50 mg.  Also suggested he take extra dose of spironolactone  for edema.  Returns to discuss results of his monitor DOAC- Xarelto  20 mg BP- Valsartan  80 mg; - Spironolactone  25 mg; - Amlodipine  10 mg; - Toprol  25 mg (increased to 50 mg at last visit)  Subjective  Discussed the use of AI scribe software for clinical note transcription with the patient, who gave verbal consent to proceed.  History of Present Illness  Patrick Sosa is a 78 year old male with hypertension who presents with elevated blood pressure readings in the afternoon and evening.  His blood pressure is well-controlled in the morning with readings around 117/66 to 130/74, but it increases in the afternoon and evening, reaching up to 175/91. He takes valsartan  and spironolactone  in the morning and amlodipine  and metoprolol  in the evening. Recent evening readings were 171/86 and 175/91.  No palpitations, chest pain, or pressure. He has a history of premature ventricular contractions related to low potassium  levels, which resolved after correction. A monitor showed rare premature beats and short runs of 4-6 beats.  He experiences occasional morning headaches, which he attributes to humidity or dry air. Swelling in his left foot has improved, allowing him to sleep in bed rather than a recliner. He attributes the foot pain to hereditary factors, neuropathy, and bunion pain. No dizziness, wooziness, or syncope.  He is currently taking Xarelto  for Factor V Leiden and pravastatin  20 mg for cholesterol management. No recent blood clots, gastrointestinal bleeding, epistaxis, or cramping. Recent blood work showed total cholesterol of 104, HDL of 27, LDL of 44, triglycerides of 159, hemoglobin of 15.6, creatinine of 1.3, potassium of 4.2, and TSH of 2.32. His A1c was previously 5.2.  Cardiovascular ROS: no chest pain or dyspnea on exertion positive for - notably improved edema negative for - irregular heartbeat, orthopnea, palpitations, paroxysmal nocturnal dyspnea, rapid heart rate, shortness of breath, or syncope/near syncope, TIA/CVA/amaurosis fugax, claudication  ROS:  Review of Systems - Negative except ~ L foot / ankle neuropathy    Objective   Current Meds  Medication Sig   amLODipine  (NORVASC ) 10 MG tablet Take 1 tablet (10 mg total) by mouth daily.   pravastatin  (PRAVACHOL ) 20 MG tablet Take 1 tablet (20 mg total) by mouth daily.   spironolactone  (ALDACTONE ) 25 MG tablet Take 0.5 tablets (12.5 mg total) by mouth daily.   XARELTO  20 MG TABS tablet TAKE 1 TABLET(20 MG) BY MOUTH DAILY WITH SUPPER   [  Refilled] metoprolol  succinate (TOPROL -XL) 50 MG 24 hr tablet Take 1 tablet (50 mg total) by mouth daily. Take with or immediately following a meal.   [Refilled] valsartan  (DIOVAN ) 80 MG tablet Take 1 tablet (80 mg total) by mouth daily.        traMADol  (ULTRAM ) 50 MG tablet dimenhyDRINATE (DRAMAMINE) 50 MG tablet acetaminophen  (TYLENOL ) 325 MG tablet   Take 1 tablet (50 mg total) by mouth at  bedtime. Take 50 mg by mouth at bedtime. Take 325 mg by mouth at bedtime.    Studies Reviewed: SABRA   EKG Interpretation Date/Time:  Wednesday January 18 2024 08:55:06 EDT Ventricular Rate:  65 PR Interval:  196 QRS Duration:  98 QT Interval:  402 QTC Calculation: 418 R Axis:   9  Text Interpretation: Normal sinus rhythm Normal ECG When compared with ECG of 19-Sep-2023 08:39, No significant change was found Confirmed by Anner Lenis ([id number]) on 01/18/2024 9:07:00 AM     LABS (from PCP-K PN) Total cholesterol: 104; HDL: 27; LDL: 44; Triglycerides: 159; plan hemoglobin: 15.6; Creatinine: 1.3; Potassium: 4.2; TSH: 2.32 (10/13/2023)  DIAGNOSTIC ~14 D Zio Patch (May 2024)   Predominant underlying rhythm is sinus rhythm with a heart rate range of 40-94 beats minute with a average of 55 beats a minute.   3 atrial runs: Fastest and longest was 6 beats (3.1 seconds) at a rate of 121 bpm-average 114 bpm.   Rare PACs with couplets and triplets noted, rare PVCs noted.  No PVC couplets, triplets or bigeminy/trigeminy.   No Sustained Arrhythmias: Atrial Tachycardia (AT), Supraventricular Tachycardia (SVT), Atrial Fibrillation (A-Fib), Atrial Flutter (A-Flutter), Sustained Ventricular Tachycardia (VT)     Overall relatively benign study with short atrial runs and rare PACs only.  No significant PVC burden. No sustained arrhythmias.  Risk Assessment/Calculations:          Physical Exam:   VS:  BP 130/70   Pulse 65   Ht 6' (1.829 m)   Wt 193 lb (87.5 kg)   BMI 26.18 kg/m    Wt Readings from Last 3 Encounters:  01/18/24 193 lb (87.5 kg)  10/13/23 203 lb (92.1 kg)  09/19/23 208 lb 9.6 oz (94.6 kg)    GEN: Well nourished, well groomed in no acute distress; healthy appearing. NECK: No JVD; No carotid bruits CARDIAC: Normal S1, S2; RRR, no murmurs, rubs, gallops RESPIRATORY:  Clear to auscultation without rales, wheezing or rhonchi ; nonlabored, good air movement. ABDOMEN: Soft, non-tender,  non-distended EXTREMITIES:  No edema; No deformity      ASSESSMENT AND PLAN: .    Problem List Items Addressed This Visit       Cardiology Problems   Essential hypertension - Primary (Chronic)   Hypertension with elevated evening readings. Current regimen includes valsartan , spironolactone , amlodipine , and metoprolol . Valsartan  dose can be increased. Medication timing adjusted for optimal control.  - Switch valsartan  80 mg to morning and metoprolol  succinate 50 mg to evening. - Continue amlodipine  10 mg at night. - Monitor blood pressure, especially in the afternoon and evening. - If blood pressure remains high in the evening after a couple of weeks, plan will be to increase valsartan  dose to 160 mg. - Follow up via MyChart in a couple of weeks with blood pressure readings.      Relevant Medications   metoprolol  succinate (TOPROL -XL) 50 MG 24 hr tablet   valsartan  (DIOVAN ) 80 MG tablet   Other Relevant Orders   EKG 12-Lead (Completed)   Hypercholesterolemia (Chronic)  Excellent cholesterol control with pravastatin . - Continue pravastatin  20 mg.      Relevant Medications   metoprolol  succinate (TOPROL -XL) 50 MG 24 hr tablet   valsartan  (DIOVAN ) 80 MG tablet   PVC's (premature ventricular contractions); Hx of bigeminy (Chronic)   Zio patch monitor showed rare ectopy both PACs and PVCs along with short atrial runs.  All benign. Palpitations seem to be pretty stable. -Continue to monitor electrolytes-magnesium and potassium - Continue Toprol  50 mg daily        Relevant Medications   metoprolol  succinate (TOPROL -XL) 50 MG 24 hr tablet   valsartan  (DIOVAN ) 80 MG tablet   RBBB (Chronic)   Essentially benign      Relevant Medications   metoprolol  succinate (TOPROL -XL) 50 MG 24 hr tablet   valsartan  (DIOVAN ) 80 MG tablet     Other   Factor V deficiency (HCC); Hx of DVT (on long term anticoagulation) (Chronic)   Well-managed on Xarelto  with no recent thrombotic events  or bleeding complications. - Continue Xarelto  20 mg daily for prophylaxis.  Based on the lack of recent urologic events, would probably be okay to hold 2 to 3 days preop for surgical procedures, but would defer to hematology..      Other Visit Diagnoses       Medication management                  Follow-Up: Return in about 6 months (around 07/20/2024) for Alternate 6 month follow-up with APP & MD.     Signed, Alm MICAEL Clay, MD, MS [name], M.D., M.S. Interventional Chartered certified accountant  Pager # [phone]

## 2024-01-23 ENCOUNTER — Encounter: Payer: Self-pay | Admitting: Cardiology

## 2024-01-23 NOTE — Assessment & Plan Note (Addendum)
 Excellent cholesterol control with pravastatin . - Continue pravastatin  20 mg.

## 2024-01-23 NOTE — Assessment & Plan Note (Signed)
 Essentially benign

## 2024-01-23 NOTE — Assessment & Plan Note (Signed)
 Zio patch monitor showed rare ectopy both PACs and PVCs along with short atrial runs.  All benign. Palpitations seem to be pretty stable. -Continue to monitor electrolytes-magnesium and potassium - Continue Toprol  50 mg daily

## 2024-01-23 NOTE — Assessment & Plan Note (Signed)
 Hypertension with elevated evening readings. Current regimen includes valsartan , spironolactone , amlodipine , and metoprolol . Valsartan  dose can be increased. Medication timing adjusted for optimal control.  - Switch valsartan  80 mg to morning and metoprolol  succinate 50 mg to evening. - Continue amlodipine  10 mg at night. - Monitor blood pressure, especially in the afternoon and evening. - If blood pressure remains high in the evening after a couple of weeks, plan will be to increase valsartan  dose to 160 mg. - Follow up via MyChart in a couple of weeks with blood pressure readings.

## 2024-01-23 NOTE — Assessment & Plan Note (Signed)
 Well-managed on Xarelto  with no recent thrombotic events or bleeding complications. - Continue Xarelto  20 mg daily for prophylaxis.  Based on the lack of recent urologic events, would probably be okay to hold 2 to 3 days preop for surgical procedures, but would defer to hematology.SABRA

## 2024-02-03 ENCOUNTER — Encounter: Payer: Self-pay | Admitting: Cardiology

## 2024-02-03 DIAGNOSIS — I10 Essential (primary) hypertension: Secondary | ICD-10-CM

## 2024-02-05 NOTE — Telephone Encounter (Signed)
 Blood pressure is a lot better, but are still higher in the afternoon then would like to see.  Lets increase the valsartan  dose to 2 tablets (160 mg daily) => hopefully this will bring down the evening pressure.  If all looks well, we can just change the prescription to indicate 160 mg tablet as opposed to 80 mg tablet x 2  [name], MD

## 2024-02-17 MED ORDER — VALSARTAN 160 MG PO TABS: 160.0000 mg | ORAL_TABLET | Freq: Every day | ORAL | 3 refills | Status: DC

## 2024-02-17 MED ORDER — SPIRONOLACTONE 25 MG PO TABS: 25.0000 mg | ORAL_TABLET | Freq: Every day | ORAL | 3 refills | Status: AC

## 2024-02-17 NOTE — Telephone Encounter (Signed)
 Called patient and informed him of Dr. Sibyl advisement. Patient verbalized understanding. Placed all new orders.

## 2024-02-17 NOTE — Telephone Encounter (Signed)
 Hard to figure this out. As I understand it he is taking valsartan  and spironolactone  in the morning and Toprol  plus amlodipine  in the evening and were now taking 160 of valsartan .  Lets increase the spironolactone  to 25 mg and continue current dose of valsartan  for now.  Should probably have him come in to get a BMP panel in a couple weeks and then have him come and see our [facility] pharmacists to see if he can figure out any further titration.   [name], MD

## 2024-02-28 ENCOUNTER — Encounter: Payer: Self-pay | Admitting: Cardiology

## 2024-03-02 LAB — BASIC METABOLIC PANEL WITH GFR
BUN/Creatinine Ratio: 16 (ref 10–24)
BUN: 23 mg/dL (ref 8–27)
CO2: 24 mmol/L (ref 20–29)
Calcium: 9.6 mg/dL (ref 8.6–10.2)
Chloride: 99 mmol/L (ref 96–106)
Creatinine, Ser: 1.43 mg/dL — ABNORMAL HIGH (ref 0.76–1.27)
Glucose: 89 mg/dL (ref 70–99)
Potassium: 4.4 mmol/L (ref 3.5–5.2)
Sodium: 138 mmol/L (ref 134–144)
eGFR: 51 mL/min/1.73 — ABNORMAL LOW (ref >59)

## 2024-03-03 ENCOUNTER — Ambulatory Visit: Payer: Self-pay | Admitting: Cardiology

## 2024-03-25 NOTE — Progress Notes (Unsigned)
 Patient ID: Patrick Sosa                 DOB: 1945/10/29                      MRN: [id number]      HPI: Patrick Sosa is a 78 y.o. male referred by Dr. Anner to HTN clinic. PMH is significant for HTN (mild hypertensive heart disease), factor V Leiden deficiency (history of DVT on Xarelto ), HLD, prostate cancer, and polycythemia.  Current HTN meds: valsartan , spironolactone , amlodipine , and metoprolol .  Previously tried:  BP goal:   Family History:   Social History:   Diet:   Exercise:  {types:28256}  Home BP readings:  Date SBP/DBP  HR              Average      Wt Readings from Last 3 Encounters:  01/18/24 193 lb (87.5 kg)  10/13/23 203 lb (92.1 kg)  09/19/23 208 lb 9.6 oz (94.6 kg)   BP Readings from Last 3 Encounters:  01/18/24 130/70  10/13/23 138/64  09/19/23 (!) 150/80   Pulse Readings from Last 3 Encounters:  01/18/24 65  10/13/23 67  09/19/23 73    Renal function: CrCl cannot be calculated (Patient's most recent lab result is older than the maximum 21 days allowed.).  Past Medical History:  Diagnosis Date   Chronic anticoagulation    Diverticulosis    DVT (deep venous thrombosis) (HCC)    Factor V deficiency (HCC)    Hx of adenomatous colonic polyps 08/20/2018   Hypercholesterolemia    Hypertension    Kidney disease    Stage III   Kidney stones    Pneumonia    Polycythemia    Prostate cancer [facility])     Current Outpatient Medications on File Prior to Visit  Medication Sig Dispense Refill   acetaminophen  (TYLENOL ) 325 MG tablet Take 325 mg by mouth at bedtime.     amLODipine  (NORVASC ) 10 MG tablet Take 1 tablet (10 mg total) by mouth daily. 90 tablet 3   dimenhyDRINATE (DRAMAMINE) 50 MG tablet Take 50 mg by mouth at bedtime.     metoprolol  succinate (TOPROL -XL) 50 MG 24 hr tablet Take 1 tablet (50 mg total) by mouth at bedtime. Take with or immediately following a meal.     pravastatin  (PRAVACHOL ) 20 MG tablet Take 1 tablet (20 mg total) by  mouth daily. 90 tablet 3   spironolactone  (ALDACTONE ) 25 MG tablet Take 1 tablet (25 mg total) by mouth daily. 90 tablet 3   traMADol  (ULTRAM ) 50 MG tablet Take 1 tablet (50 mg total) by mouth at bedtime. 30 tablet 2   valsartan  (DIOVAN ) 160 MG tablet Take 1 tablet (160 mg total) by mouth daily. 90 tablet 3   XARELTO  20 MG TABS tablet TAKE 1 TABLET(20 MG) BY MOUTH DAILY WITH SUPPER 90 tablet 1   No current facility-administered medications on file prior to visit.    No Known Allergies  There were no vitals taken for this visit.   Assessment/Plan:  1. Hypertension -  No problems updated. No problem-specific Assessment & Plan notes found for this encounter.      Thank you  Robbi Blanch, Pharm.D [place] [facility] [address], [place], [place] [address] Phone: [phone]; Fax: [phone]

## 2024-03-26 ENCOUNTER — Ambulatory Visit: Attending: Cardiology | Admitting: Pharmacist

## 2024-03-26 ENCOUNTER — Encounter: Payer: Self-pay | Admitting: Pharmacist

## 2024-03-26 VITALS — BP 126/78 | HR 66

## 2024-03-26 DIAGNOSIS — I10 Essential (primary) hypertension: Secondary | ICD-10-CM | POA: Diagnosis not present

## 2024-03-26 NOTE — Patient Instructions (Signed)
 No changes made by your pharmacist Robbi Blanch, PharmD at today's visit:    Bring all of your meds, your BP cuff and your record of home blood pressures to your next appointment.    HOW TO TAKE YOUR BLOOD PRESSURE AT HOME  Rest 5 minutes before taking your blood pressure.  Don't smoke or drink caffeinated beverages for at least 30 minutes before. Take your blood pressure before (not after) you eat. Sit comfortably with your back supported and both feet on the floor (don't cross your legs). Elevate your arm to heart level on a table or a desk. Use the proper sized cuff. It should fit smoothly and snugly around your bare upper arm. There should be enough room to slip a fingertip under the cuff. The bottom edge of the cuff should be 1 inch above the crease of the elbow. Ideally, take 3 measurements at one sitting and record the average.  Important lifestyle changes to control high blood pressure  Intervention  Effect on the BP  Lose extra pounds and watch your waistline Weight loss is one of the most effective lifestyle changes for controlling blood pressure. If you're overweight or obese, losing even a small amount of weight can help reduce blood pressure. Blood pressure might go down by about 1 millimeter of mercury (mm Hg) with each kilogram (about 2.2 pounds) of weight lost.  Exercise regularly As a general goal, aim for at least 30 minutes of moderate physical activity every day. Regular physical activity can lower high blood pressure by about 5 to 8 mm Hg.  Eat a healthy diet Eating a diet rich in whole grains, fruits, vegetables, and low-fat dairy products and low in saturated fat and cholesterol. A healthy diet can lower high blood pressure by up to 11 mm Hg.  Reduce salt (sodium) in your diet Even a small reduction of sodium in the diet can improve heart health and reduce high blood pressure by about 5 to 6 mm Hg.  Limit alcohol One drink equals 12 ounces of beer, 5 ounces of wine,  or 1.5 ounces of 80-proof liquor.  Limiting alcohol to less than one drink a day for women or two drinks a day for men can help lower blood pressure by about 4 mm Hg.   If you have any questions or concerns please use My Chart to send questions or call the office at [phone]

## 2024-03-26 NOTE — Assessment & Plan Note (Signed)
 Assessment: In office BP 126/78 mmHg above the goal (<130/80) Takes and tolerates current medications well without side effects  Denies SOB, palpitation, chest pain, headaches,or swelling Follows low salt diet and stays active around the house doing yard work  Arthritis related pain can be contributory factor for evening elevated home BP readings otherwise morning BP at goal    Plan:  No change at this visit, will validate home BP monitor to avoid over medication  Continue taking alsartan 160 mg daily in the morning  , spironolactone  25 mg daily in the morning, amlodipine  10 mg every evening , and metoprolol  XL 50 mg daily in the evening  Patient to keep record of BP readings with heart rate and report to us  at the next visit Patient to see PharmD in 4 weeks for follow up  Follow up lab(s): none  Due to low K level pt had PVCs so spironolactone  was added to elevate K level. Careful adding thiazide ( past 2-3 years K WNL)

## 2024-04-04 ENCOUNTER — Encounter: Payer: Self-pay | Admitting: Adult Health

## 2024-04-06 ENCOUNTER — Other Ambulatory Visit: Payer: Self-pay | Admitting: Adult Health

## 2024-04-06 DIAGNOSIS — M79671 Pain in right foot: Secondary | ICD-10-CM

## 2024-04-06 MED ORDER — TRAMADOL HCL 50 MG PO TABS: 50.0000 mg | ORAL_TABLET | Freq: Every evening | ORAL | 2 refills | Status: DC

## 2024-04-06 NOTE — Telephone Encounter (Signed)
**Note De-identified  [place] Obfuscation** Please advise 

## 2024-04-13 ENCOUNTER — Ambulatory Visit: Admitting: Adult Health

## 2024-04-13 ENCOUNTER — Encounter: Payer: Self-pay | Admitting: Adult Health

## 2024-04-13 VITALS — BP 110/80 | HR 82 | Temp 98.0°F | Ht 72.0 in | Wt 193.0 lb

## 2024-04-13 DIAGNOSIS — I10 Essential (primary) hypertension: Secondary | ICD-10-CM | POA: Diagnosis not present

## 2024-04-13 DIAGNOSIS — M79672 Pain in left foot: Secondary | ICD-10-CM

## 2024-04-13 DIAGNOSIS — M79671 Pain in right foot: Secondary | ICD-10-CM | POA: Diagnosis not present

## 2024-04-13 NOTE — Progress Notes (Signed)
 Subjective:    Patient ID: Patrick Sosa, male    DOB: 02-Nov-1945, 78 y.o.   MRN: [id number]  HPI 78 year old male who  has a past medical history of Chronic anticoagulation, Diverticulosis, DVT (deep venous thrombosis) (HCC), Factor V deficiency (HCC), adenomatous colonic polyps (08/20/2018), Hypercholesterolemia, Hypertension, Kidney disease, Kidney stones, Pneumonia, Polycythemia, and Prostate cancer (HCC).  He presents to the office today for chronic bilateral foot pain and HTN   Bilateral Foot Pain - He has long history of bilateral foot pain and has seen podiatry for quite sometime to have custom inserts done.  He has significant pain around the MPJ joints bilaterally, left greater than right.  He also has noted digital deformities.  Reports that he was last seen by podiatry they prescribed him gabapentin  300 mg at bedtime.  He reports that podiatry told him that there is nothing else that could be done for his chronic foot pain.  When he was seen in March 2025 he reported that the gabapentin  did not help his pain at all and he had developed some lower extremity edema.  I had him stop gabapentin  and prescribed him tramadol  50 mg nightly.  Since starting tramadol  he reports significant improvement in his pain, he will take 1 tab at bedtime and if he has any further discomfort during the day it is relieved with Tylenol .  His lower extremity edema has also resolved.  He denies any side effects of tramadol   HTN - Prescribed Norvasc  10 mg,  Toprol  50  mg XL daily, Valsartan   160 mg daily,  and spironolactone  25 mg daily.  He does monitor his blood pressures at home and reports readings that are WNL in the morning and then go up throughout the day and can be upwards of 140/70 in the evening. He is working with the pharmacist through Cardiology who recently doubled his Valsartan  to 160 mg, since this increase his evening blood pressures have come down into the 140'/70. He denies chest pain, shortness of  breath, headaches, blurred vision, or syncopal episodes BP Readings from Last 3 Encounters:  04/13/24 110/80  03/26/24 126/78  01/18/24 130/70    Review of Systems See HPI   Past Medical History:  Diagnosis Date   Chronic anticoagulation    Diverticulosis    DVT (deep venous thrombosis) (HCC)    Factor V deficiency (HCC)    Hx of adenomatous colonic polyps 08/20/2018   Hypercholesterolemia    Hypertension    Kidney disease    Stage III   Kidney stones    Pneumonia    Polycythemia    Prostate cancer [facility])     Social History   Socioeconomic History   Marital status: Married    Spouse name: Not on file   Number of children: 3   Years of education: Not on file   Highest education level: Bachelor's degree (e.g., BA, AB, BS)  Occupational History   Occupation: retired  Tobacco Use   Smoking status: Former    Current packs/day: 0.00    Types: Cigarettes    Quit date: 10/11/1981    Years since quitting: 42.5   Smokeless tobacco: Never  Vaping Use   Vaping status: Never Used  Substance and Sexual Activity   Alcohol use: No    Alcohol/week: 0.0 standard drinks of alcohol    Comment: rarely   Drug use: No   Sexual activity: Yes  Other Topics Concern   Not on file  Social History  Narrative   Retired    - IT trainer    Married    Three children    Six grandchildren       He is active at [employer] of [employer]: Low Risk  (04/12/2024)   Overall Financial Resource Strain (CARDIA)    Difficulty of Paying Living Expenses: Not hard at all  Food Insecurity: No Food Insecurity (04/12/2024)   Hunger Vital Sign    Worried About Running Out of Food in the Last Year: Never true    Ran Out of Food in the Last Year: Never true  Transportation Needs: No Transportation Needs (04/12/2024)   PRAPARE - Administrator, Civil Service (Medical): No    Lack of Transportation (Non-Medical): No  Physical Activity: Insufficiently Active  (04/12/2024)   Exercise Vital Sign    Days of Exercise per Week: 4 days    Minutes of Exercise per Session: 30 min  Stress: No Stress Concern Present (04/12/2024)   [employer] of Occupational Health - Occupational Stress Questionnaire    Feeling of Stress: Not at all  Social Connections: Moderately Isolated (04/12/2024)   Social Connection and Isolation Panel    Frequency of Communication with Friends and Family: More than three times a week    Frequency of Social Gatherings with Friends and Family: Three times a week    Attends Religious Services: Never    Active Member of Clubs or Organizations: No    Attends Banker Meetings: Not on file    Marital Status: Married  Catering manager Violence: Not At Risk (06/20/2023)   Humiliation, Afraid, Rape, and Kick questionnaire    Fear of Current or Ex-Partner: No    Emotionally Abused: No    Physically Abused: No    Sexually Abused: No    Past Surgical History:  Procedure Laterality Date   CATARACT EXTRACTION, BILATERAL Bilateral 2018   COLONOSCOPY     INGUINAL HERNIA REPAIR Left 03/11/2023   Procedure: LAPAROSCOPIC POSSIBLE OPEN LEFT INGUINAL HERNIA REPAIR WITH MESH;  Surgeon: Stechschulte, Deward PARAS, MD;  Location: [facility];  Service: General;  Laterality: Left;   ROBOT ASSISTED LAPAROSCOPIC RADICAL PROSTATECTOMY  10/06/2021   TONSILLECTOMY      Family History  Problem Relation Age of Onset   Alzheimer's disease Mother    Arthritis Mother    Pancreatic cancer Father    Hypertension Father    Heart attack Father    Hypertension Sister    Breast cancer Sister    Arthritis Sister    Rheum arthritis Daughter    Colon cancer Neg Hx    Stomach cancer Neg Hx    Rectal cancer Neg Hx    Liver cancer Neg Hx    Esophageal cancer Neg Hx     No Known Allergies  Current Outpatient Medications on File Prior to Visit  Medication Sig Dispense Refill   acetaminophen  (TYLENOL ) 325 MG tablet Take 325 mg by mouth at  bedtime.     amLODipine  (NORVASC ) 10 MG tablet Take 1 tablet (10 mg total) by mouth daily. 90 tablet 3   dimenhyDRINATE (DRAMAMINE) 50 MG tablet Take 50 mg by mouth at bedtime.     metoprolol  succinate (TOPROL -XL) 50 MG 24 hr tablet Take 1 tablet (50 mg total) by mouth at bedtime. Take with or immediately following a meal.     pravastatin  (PRAVACHOL ) 20 MG tablet Take 1 tablet (20 mg total) by  mouth daily. 90 tablet 3   spironolactone  (ALDACTONE ) 25 MG tablet Take 1 tablet (25 mg total) by mouth daily. 90 tablet 3   traMADol  (ULTRAM ) 50 MG tablet Take 1 tablet (50 mg total) by mouth at bedtime. 30 tablet 2   valsartan  (DIOVAN ) 160 MG tablet Take 1 tablet (160 mg total) by mouth daily. 90 tablet 3   XARELTO  20 MG TABS tablet TAKE 1 TABLET(20 MG) BY MOUTH DAILY WITH SUPPER 90 tablet 1   No current facility-administered medications on file prior to visit.    BP 110/80   Pulse 82   Temp 98 F (36.7 C) (Oral)   Ht 6' (1.829 m)   Wt 193 lb (87.5 kg)   SpO2 98%   BMI 26.18 kg/m       Objective:   Physical Exam Vitals and nursing note reviewed.  Constitutional:      General: He is not in acute distress.    Appearance: Normal appearance. He is not ill-appearing.  HENT:     Head: Normocephalic and atraumatic.     Right Ear: Tympanic membrane, ear canal and external ear normal. There is no impacted cerumen.     Left Ear: Tympanic membrane, ear canal and external ear normal. There is no impacted cerumen.     Nose: Nose normal. No congestion or rhinorrhea.     Mouth/Throat:     Mouth: Mucous membranes are moist.     Pharynx: Oropharynx is clear.  Eyes:     Extraocular Movements: Extraocular movements intact.     Conjunctiva/sclera: Conjunctivae normal.     Pupils: Pupils are equal, round, and reactive to light.  Neck:     Vascular: No carotid bruit.  Cardiovascular:     Rate and Rhythm: Normal rate and regular rhythm.     Pulses: Normal pulses.     Heart sounds: No murmur heard.     No friction rub. No gallop.  Pulmonary:     Effort: Pulmonary effort is normal.     Breath sounds: Normal breath sounds.  Abdominal:     General: Abdomen is flat. Bowel sounds are normal. There is no distension.     Palpations: Abdomen is soft. There is no mass.     Tenderness: There is no abdominal tenderness. There is no guarding or rebound.     Hernia: No hernia is present.  Musculoskeletal:        General: Normal range of motion.     Cervical back: Normal range of motion and neck supple.  Lymphadenopathy:     Cervical: No cervical adenopathy.  Skin:    General: Skin is warm and dry.     Capillary Refill: Capillary refill takes less than 2 seconds.  Neurological:     General: No focal deficit present.     Mental Status: He is alert and oriented to person, place, and time.  Psychiatric:        Mood and Affect: Mood normal.        Behavior: Behavior normal.        Thought Content: Thought content normal.        Judgment: Judgment normal.       Assessment & Plan:  1. Bilateral foot pain (Primary) - Continue with Tramadol  50 mg  at bedtime - Follow up in 6 months for CPE   2. Essential hypertension - At goal today.  - Continue to follow direction by Cardiology   Darleene Shape, NP

## 2024-04-16 ENCOUNTER — Other Ambulatory Visit: Payer: Self-pay | Admitting: Medical Genetics

## 2024-04-16 DIAGNOSIS — Z006 Encounter for examination for normal comparison and control in clinical research program: Secondary | ICD-10-CM

## 2024-04-25 ENCOUNTER — Encounter: Payer: Self-pay | Admitting: Pharmacist

## 2024-04-25 ENCOUNTER — Ambulatory Visit: Attending: Cardiology | Admitting: Pharmacist

## 2024-04-25 VITALS — BP 148/80 | HR 60

## 2024-04-25 DIAGNOSIS — I10 Essential (primary) hypertension: Secondary | ICD-10-CM | POA: Diagnosis not present

## 2024-04-25 NOTE — Assessment & Plan Note (Signed)
 Assessment: In office BP  above the goal (<130/80) Takes and tolerates current medications well without side effects  Denies SOB, palpitation, chest pain, headaches,or swelling Arthritis related pain and salty food can be contributory factor for evening elevated home BP readings otherwise morning BP at goal      Plan:  No change at this visit, patient to lower salt intake  Continue taking valsartan  160 mg daily in the morning  , spironolactone  25 mg daily in the morning, amlodipine  10 mg every evening , and metoprolol  XL 50 mg daily in the evening  Patient to keep record of BP readings with heart rate and report to us  at the next visit Patient to see PharmD in 4 weeks for follow up  Follow up lab(s): none

## 2024-04-25 NOTE — Progress Notes (Signed)
 Patient ID: Patrick Sosa                 DOB: Nov 09, 1945                      MRN: [id number]      HPI: Patrick Sosa is a 78 y.o. male referred by Dr. Anner to HTN clinic. PMH is significant for HTN (mild hypertensive heart disease), factor V Leiden deficiency (history of DVT on Xarelto ), HLD, prostate cancer, and polycythemia.  At last visit with MD patient reported having evening BP elevated on home BP log. So valsartan  dose was increased and timing was changed.  Patient presented today for hypertension clinic. Brought in wrist BP monitor, validated today and it reads within 10 points compared to office BP machine. His BP at home ~145/85 range. Today he had busy day taking care of grand daughter. He admits the days when he eats salty food his BP goes up rest of the time it stays closer to the goal.     Current HTN meds: valsartan  160 mg daily in the morning, spironolactone  25 mg daily in the morning, amlodipine  10 mg every evening , and metoprolol  XL 50 mg daily in the evening .  Previously tried: none  BP goal: <130/80   Family History:  Relation Problem Comments  Mother (Deceased) Alzheimer's disease   Arthritis     Father (Deceased) Heart attack   Hypertension   Pancreatic cancer     Sister Arthritis   Breast cancer   Hypertension     Maternal Grandmother (Deceased)   Maternal Grandfather (Deceased)   Paternal Grandmother (Deceased)   Paternal Grandfather (Deceased)   Daughter Rheum arthritis     Social History:   Diet: mainly healthy, doesn't add salt to his food, eats out 2 times per week  Drink -water through out the day some cranberry juice  Snacks: peanut butter crackers   Exercise:  stay active around the house. Lower legs arthritis limits exercise ability    Home BP readings: varies at goal in the morning but elevated in the evening 139/ 79, 158/89 163/82.   Wt Readings from Last 3 Encounters:  04/13/24 193 lb (87.5 kg)  01/18/24 193 lb (87.5 kg)   10/13/23 203 lb (92.1 kg)   BP Readings from Last 3 Encounters:  04/13/24 110/80  03/26/24 126/78  01/18/24 130/70   Pulse Readings from Last 3 Encounters:  04/13/24 82  03/26/24 66  01/18/24 65    Renal function: CrCl cannot be calculated (Patient's most recent lab result is older than the maximum 21 days allowed.).  Past Medical History:  Diagnosis Date   Chronic anticoagulation    Diverticulosis    DVT (deep venous thrombosis) (HCC)    Factor V deficiency (HCC)    Hx of adenomatous colonic polyps 08/20/2018   Hypercholesterolemia    Hypertension    Kidney disease    Stage III   Kidney stones    Pneumonia    Polycythemia    Prostate cancer [facility])     Current Outpatient Medications on File Prior to Visit  Medication Sig Dispense Refill   acetaminophen  (TYLENOL ) 325 MG tablet Take 325 mg by mouth at bedtime.     amLODipine  (NORVASC ) 10 MG tablet Take 1 tablet (10 mg total) by mouth daily. 90 tablet 3   dimenhyDRINATE (DRAMAMINE) 50 MG tablet Take 50 mg by mouth at bedtime.     metoprolol  succinate (TOPROL -XL) 50  MG 24 hr tablet Take 1 tablet (50 mg total) by mouth at bedtime. Take with or immediately following a meal.     pravastatin  (PRAVACHOL ) 20 MG tablet Take 1 tablet (20 mg total) by mouth daily. 90 tablet 3   spironolactone  (ALDACTONE ) 25 MG tablet Take 1 tablet (25 mg total) by mouth daily. 90 tablet 3   traMADol  (ULTRAM ) 50 MG tablet Take 1 tablet (50 mg total) by mouth at bedtime. 30 tablet 2   valsartan  (DIOVAN ) 160 MG tablet Take 1 tablet (160 mg total) by mouth daily. 90 tablet 3   XARELTO  20 MG TABS tablet TAKE 1 TABLET(20 MG) BY MOUTH DAILY WITH SUPPER 90 tablet 1   No current facility-administered medications on file prior to visit.    No Known Allergies  There were no vitals taken for this visit.   Assessment/Plan:  1. Hypertension -  Problem  Essential Hypertension   Qualifier: Diagnosis of  By: Krystal OBIE Czar  Current HTN meds: valsartan   160 mg daily in the morning  , spironolactone  25 mg daily in the morning, amlodipine  10 mg every evening , and metoprolol  XL 50 mg daily in the evening .  Previously tried: none  BP goal: <130/80      Essential hypertension Assessment: In office BP  above the goal (<130/80) Takes and tolerates current medications well without side effects  Denies SOB, palpitation, chest pain, headaches,or swelling Arthritis related pain and salty food can be contributory factor for evening elevated home BP readings otherwise morning BP at goal      Plan:  No change at this visit, patient to lower salt intake  Continue taking valsartan  160 mg daily in the morning  , spironolactone  25 mg daily in the morning, amlodipine  10 mg every evening , and metoprolol  XL 50 mg daily in the evening  Patient to keep record of BP readings with heart rate and report to us  at the next visit Patient to see PharmD in 4 weeks for follow up  Follow up lab(s): none    Thank you  Robbi Blanch, Pharm.D [facility]. [facility] [address], [place], [place] [address] Phone: [phone]; Fax: [phone]

## 2024-05-14 LAB — GENECONNECT MOLECULAR SCREEN: Genetic Analysis Overall Interpretation: NEGATIVE

## 2024-05-15 NOTE — Progress Notes (Signed)
   05/15/2024  Patient ID: Patrick Sosa, male   DOB: 06/12/1946, 78 y.o.   MRN: [id number]  Pharmacy Quality Measure Review  This patient is appearing on a report for being at risk of failing the adherence measure for cholesterol (statin) medications this calendar year.   Medication: Pravastatin  Last fill date: 05/01/24 for 90 day supply  Insurance report was not up to date. No action needed at this time.   Jon VEAR [place], PharmD Clinical Pharmacist [phone]

## 2024-06-12 ENCOUNTER — Encounter: Payer: Self-pay | Admitting: Cardiology

## 2024-06-27 ENCOUNTER — Other Ambulatory Visit: Payer: Self-pay | Admitting: Cardiology

## 2024-06-27 ENCOUNTER — Encounter: Payer: Self-pay | Admitting: Pharmacist

## 2024-06-27 ENCOUNTER — Ambulatory Visit: Attending: Cardiovascular Disease | Admitting: Pharmacist

## 2024-06-27 VITALS — BP 121/71 | HR 64

## 2024-06-27 DIAGNOSIS — Z7901 Long term (current) use of anticoagulants: Secondary | ICD-10-CM

## 2024-06-27 DIAGNOSIS — I10 Essential (primary) hypertension: Secondary | ICD-10-CM

## 2024-06-27 DIAGNOSIS — D682 Hereditary deficiency of other clotting factors: Secondary | ICD-10-CM

## 2024-06-27 MED ORDER — VALSARTAN 160 MG PO TABS: 160.0000 mg | ORAL_TABLET | Freq: Two times a day (BID) | ORAL | 3 refills | Status: AC

## 2024-06-27 NOTE — Assessment & Plan Note (Signed)
 Assessment: In office BP  above the goal (<130/80) Takes and tolerates current medications well without side effects  Home BP monitor validated today - accurate  Home BP log indicates above goal BP (135-144/75-83)      Plan:  Will up valsartan  dose to 160 mg twice daily  Continue taking spironolactone  25 mg daily in the morning, amlodipine  10 mg every evening , and metoprolol  XL 50 mg daily in the evening  Patient to keep record of BP readings with heart rate and report to us  at the next visit Patient to see PharmD in 6-8 weeks for follow up  Follow up lab(s): BMP in 2-3 weeks

## 2024-06-27 NOTE — Progress Notes (Signed)
 Patient ID: Patrick Sosa                 DOB: Feb 02, 1946                      MRN: [id number]      HPI: Patrick Sosa is a 79 y.o. male referred by Dr. Anner to HTN clinic. PMH is significant for HTN (mild hypertensive heart disease), factor V Leiden deficiency (history of DVT on Xarelto ), HLD, prostate cancer, and polycythemia.  At last visit with MD patient reported having evening BP elevated on home BP log. So valsartan  dose was increased and timing was changed. At last visit with me no meds were changed. Patient was advised to keep log for BP and lower salt intake  Patient presented today for hypertension clinic. Brought in arm cuff to get validated. Reads within 5 points compared to office cuff.per home log home morning BP ~ 120/70 range but evenings are above the goal (~ 135-144/83). Back to normal healthy low salt diet after holidays. Exercise capacity is limited due to arthritis. Denies dizziness.   Current HTN meds: valsartan  160 mg daily in the morning, spironolactone  25 mg daily in the morning, amlodipine  10 mg every evening , and metoprolol  XL 50 mg daily in the evening .  Previously tried: none  BP goal: <130/80   Family History:  Relation Problem Comments  Mother (Deceased) Alzheimer's disease   Arthritis     Father (Deceased) Heart attack   Hypertension   Pancreatic cancer     Sister Arthritis   Breast cancer   Hypertension     Maternal Grandmother (Deceased)   Maternal Grandfather (Deceased)   Paternal Grandmother (Deceased)   Paternal Grandfather (Deceased)   Daughter Rheum arthritis      Diet:  Back to normal diet low salt low fat - post holidays   Exercise:  stay active around the house. Lower legs arthritis limits exercise ability    Home BP readings: varies at goal in the morning but elevated in the evening 139/ 79,158/89 163/82.   Wt Readings from Last 3 Encounters:  04/13/24 193 lb (87.5 kg)  01/18/24 193 lb (87.5 kg)  10/13/23 203 lb (92.1 kg)    BP Readings from Last 3 Encounters:  06/27/24 121/71  04/25/24 (!) 148/80  04/13/24 110/80   Pulse Readings from Last 3 Encounters:  06/27/24 64  04/25/24 60  04/13/24 82    Renal function: CrCl cannot be calculated (Patient's most recent lab result is older than the maximum 21 days allowed.).  Past Medical History:  Diagnosis Date   Chronic anticoagulation    Diverticulosis    DVT (deep venous thrombosis) (HCC)    Factor V deficiency (HCC)    Hx of adenomatous colonic polyps 08/20/2018   Hypercholesterolemia    Hypertension    Kidney disease    Stage III   Kidney stones    Pneumonia    Polycythemia    Prostate cancer [facility])     Current Outpatient Medications on File Prior to Visit  Medication Sig Dispense Refill   acetaminophen  (TYLENOL ) 325 MG tablet Take 325 mg by mouth at bedtime.     amLODipine  (NORVASC ) 10 MG tablet Take 1 tablet (10 mg total) by mouth daily. 90 tablet 3   dimenhyDRINATE (DRAMAMINE) 50 MG tablet Take 50 mg by mouth at bedtime.     metoprolol  succinate (TOPROL -XL) 50 MG 24 hr tablet Take 1 tablet (50 mg  total) by mouth at bedtime. Take with or immediately following a meal.     pravastatin  (PRAVACHOL ) 20 MG tablet Take 1 tablet (20 mg total) by mouth daily. 90 tablet 3   spironolactone  (ALDACTONE ) 25 MG tablet Take 1 tablet (25 mg total) by mouth daily. 90 tablet 3   traMADol  (ULTRAM ) 50 MG tablet Take 1 tablet (50 mg total) by mouth at bedtime. 30 tablet 2   XARELTO  20 MG TABS tablet TAKE 1 TABLET(20 MG) BY MOUTH DAILY WITH SUPPER 90 tablet 1   No current facility-administered medications on file prior to visit.    No Known Allergies  Blood pressure 121/71, pulse 64.   Assessment/Plan:  1. Hypertension -  Problem  Essential Hypertension   Qualifier: Diagnosis of  By: Krystal OBIE Czar  Current HTN meds: valsartan  160 mg twice daily, spironolactone  25 mg daily in the morning, amlodipine  10 mg every evening , and metoprolol  XL 50 mg daily  in the evening .  Previously tried: none  BP goal: <130/80  BP machine validated on Jan 7,2026 - accurate       Essential hypertension Assessment: In office BP  above the goal (<130/80) Takes and tolerates current medications well without side effects  Home BP monitor validated today - accurate  Home BP log indicates above goal BP (135-144/75-83)      Plan:  Will up valsartan  dose to 160 mg twice daily  Continue taking spironolactone  25 mg daily in the morning, amlodipine  10 mg every evening , and metoprolol  XL 50 mg daily in the evening  Patient to keep record of BP readings with heart rate and report to us  at the next visit Patient to see PharmD in 6-8 weeks for follow up  Follow up lab(s): BMP in 2-3 weeks    Thank you  Robbi Blanch, Pharm.D [facility]. [facility] [address], [place], [place] [address] Phone: [phone]; Fax: [phone]

## 2024-07-05 ENCOUNTER — Ambulatory Visit: Payer: Self-pay

## 2024-07-05 ENCOUNTER — Other Ambulatory Visit: Payer: Self-pay | Admitting: Adult Health

## 2024-07-05 NOTE — Telephone Encounter (Signed)
 FYI Only or Action Required?: Action required by provider: tamiflu  requesting.  Patient was last seen in primary care on 04/13/2024 by Merna Huxley, NP.  Called Nurse Triage reporting No chief complaint on file..  Symptoms began yesterday.  Interventions attempted: OTC medications: cough gtts.  Symptoms are: stable.  Triage Disposition: No disposition on file.  Patient/caregiver understands and will follow disposition?:    Summary: Tamiflu  Request   Reason for Triage: Pt called to advised has fever, sore throat, and dry cough that began last night. Pt believes he has the Flu and is asking for Tamiflu  to be prescibed.         Reason for Disposition  Cough  Answer Assessment - Initial Assessment Questions 1. ONSET: When did the cough begin?      yesterday 2. SEVERITY: How bad is the cough today?      mild 3. SPUTUM: Describe the color of your sputum (e.g., none, dry cough; clear, white, yellow, green)     denies 4. HEMOPTYSIS: Are you coughing up any blood? If Yes, ask: How much? (e.g., flecks, streaks, tablespoons, etc.)     denies 5. DIFFICULTY BREATHING: Are you having difficulty breathing? If Yes, ask: How bad is it? (e.g., mild, moderate, severe)      denies 6. FEVER: Do you have a fever? If Yes, ask: What is your temperature, how was it measured, and when did it start?     Felt feverish last night, didn't check temp 7. CARDIAC HISTORY: Do you have any history of heart disease? (e.g., heart attack, congestive heart failure)      no 8. LUNG HISTORY: Do you have any history of lung disease?  (e.g., pulmonary embolus, asthma, emphysema)     no 9. PE RISK FACTORS: Do you have a history of blood clots? (or: recent major surgery, recent prolonged travel, bedridden)     Hx blood clots 10. OTHER SYMPTOMS: Do you have any other symptoms? (e.g., runny nose, wheezing, chest pain)       Sore throat, HA  Protocols used: Cough - Acute  Non-Productive-A-AH

## 2024-07-05 NOTE — Telephone Encounter (Signed)
 Pt stated he will wait til next week to schedule appt. If he is no better.

## 2024-07-05 NOTE — Telephone Encounter (Signed)
 Please advise

## 2024-07-07 ENCOUNTER — Other Ambulatory Visit: Payer: Self-pay | Admitting: Adult Health

## 2024-07-07 DIAGNOSIS — M79671 Pain in right foot: Secondary | ICD-10-CM

## 2024-07-11 ENCOUNTER — Telehealth: Payer: Self-pay | Admitting: Pharmacist

## 2024-07-11 LAB — BASIC METABOLIC PANEL WITH GFR
BUN/Creatinine Ratio: 13 (ref 10–24)
BUN: 19 mg/dL (ref 8–27)
CO2: 21 mmol/L (ref 20–29)
Calcium: 9.1 mg/dL (ref 8.6–10.2)
Chloride: 98 mmol/L (ref 96–106)
Creatinine, Ser: 1.45 mg/dL — ABNORMAL HIGH (ref 0.76–1.27)
Glucose: 106 mg/dL — ABNORMAL HIGH (ref 70–99)
Potassium: 4 mmol/L (ref 3.5–5.2)
Sodium: 138 mmol/L (ref 134–144)
eGFR: 50 mL/min/1.73 — ABNORMAL LOW

## 2024-07-11 NOTE — Telephone Encounter (Signed)
 Okay for refill?

## 2024-07-11 NOTE — Telephone Encounter (Signed)
 Called patient to update on BMP labs- labs are within normal limits, advised patient to continue taking valsartan  160 mg twice daily.

## 2024-07-16 NOTE — Progress Notes (Unsigned)
 "  Cardiology Office Note    Date:  07/19/2024  ID:  Patrick Sosa, DOB [birth year], MRN [id number] PCP:  Merna Huxley, NP  Cardiologist:  [name], MD  Electrophysiologist:  None   Chief Complaint: Follow up for HTN  History of Present Illness: .   Patrick Sosa is a 79 y.o. male with visit-pertinent history of hypertension, factor V Leyden deficiency with history of DVT on Xarelto , hyperlipidemia and polycythemia.  Patient was previously followed by [name], NP, [name] and now by Dr. Clay.  Patient was last seen in clinic in 01/2024 by Dr. Clay.  Patient noted blood pressure was well-controlled in the morning but increased in the afternoon and evening reaching up to 175/91.  Patient denied palpitations, chest pain or pressure.  Patient's valsartan  80 mg will switch to morning and metoprolol  succinate 50 mg in the evening, continue amlodipine  10 mg at night.  Today patient presents for follow-up.  He reports that he has been doing very well overall.  He denies any chest pain, shortness of breath, lower extremity edema, orthopnea or PND.  He denies any palpitations, presyncope or syncope.  Patient notes that he is recovering from the flu, has had a lingering cough.  Patient reports that his blood pressures have been improving at home with valsartan  being twice daily.  Patient continues to monitor his blood pressure at home, notes typically is averaging at 170 in the morning and 130/70 at night.  Patient reports that he regularly walks around [employer] or the grocery store when it is cold outside in order to get in some walking, tolerates well.  Labwork independently reviewed: 07/10/2024: Sodium 138, Tessman 4.0, creatinine 1.45 ROS: .   Today he denies chest pain, shortness of breath, lower extremity edema, fatigue, palpitations, melena, hematuria, hemoptysis, diaphoresis, weakness, presyncope, syncope, orthopnea, and PND.  All other systems are reviewed and otherwise  negative. Studies Reviewed: SABRA   EKG:  EKG is not ordered today.  CV Studies: Cardiac studies reviewed are outlined and summarized above. Otherwise please see EMR for full report. Cardiac Studies & Procedures   ______________________________________________________________________________________________        [name]  LONG TERM MONITOR (3-14 DAYS) 10/18/2023  Narrative   ~14 D Zio Patch (May 2024)   Predominant underlying rhythm is sinus rhythm with a heart rate range of 40-94 beats minute with a average of 55 beats a minute.   3 atrial runs: Fastest and longest was 6 beats (3.1 seconds) at a rate of 121 bpm-average 114 bpm.   Rare PACs with couplets and triplets noted, rare PVCs noted.  No PVC couplets, triplets or bigeminy/trigeminy.   No Sustained Arrhythmias: Atrial Tachycardia (AT), Supraventricular Tachycardia (SVT), Atrial Fibrillation (A-Fib), Atrial Flutter (A-Flutter), Sustained Ventricular Tachycardia (VT)   Overall relatively benign study with short atrial runs and rare PACs only.  No significant PVC burden. No sustained arrhythmias.   [name], MD       ______________________________________________________________________________________________       Current Reported Medications:.    Active Medications[1]  Physical Exam:    VS:  BP 126/62   Pulse 65   Ht 6' (1.829 m)   Wt 197 lb 3.2 oz (89.4 kg)   SpO2 97%   BMI 26.75 kg/m    Wt Readings from Last 3 Encounters:  07/19/24 197 lb 3.2 oz (89.4 kg)  04/13/24 193 lb (87.5 kg)  01/18/24 193 lb (87.5 kg)    GEN: Well nourished, well developed in  no acute distress NECK: No JVD; No carotid bruits CARDIAC: RRR, no murmurs, rubs, gallops RESPIRATORY:  Clear to auscultation without rales, wheezing or rhonchi  ABDOMEN: Soft, non-tender, non-distended EXTREMITIES:  No edema; No acute deformity     Asessement and Plan:.    HTN: Blood pressure today 126/72.  Patient reports that his blood pressure  has been well-controlled since last PharmD visit.  Patient reports that his blood pressure in the morning has been averaging 110/70, at night 130/70.  Encouraged patient to continue monitoring, to notify the office if consistently elevated of 130/80.  Continue amlodipine  10 mg daily, metoprolol  succinate 50 mg nightly, spironolactone  25 mg daily and valsartan  160 mg twice daily.  Factor V Leiden deficiency: Patient with history of DVT on chronic AC.  Patient denies any bleeding problems on Xarelto .  Hyperlipidemia: Last lipid profile on/24/25 indicated total cholesterol 104, HDL 27, triglycerides 159 and LDL 44.  Continue pravastatin  20 mg daily.  History of PVCs: Related to low potassium levels, resolved with potassium repletion.  Patient denies any palpitations or feeling of irregular heartbeats.   Disposition: F/u with Dr. Anner in six months or sooner if needed.   Signed, [name] D [name], NP       [1]  Current Meds  Medication Sig   acetaminophen  (TYLENOL ) 325 MG tablet Take 325 mg by mouth at bedtime.   amLODipine  (NORVASC ) 10 MG tablet Take 1 tablet (10 mg total) by mouth daily.   dimenhyDRINATE (DRAMAMINE) 50 MG tablet Take 50 mg by mouth at bedtime.   metoprolol  succinate (TOPROL -XL) 50 MG 24 hr tablet Take 1 tablet (50 mg total) by mouth at bedtime. Take with or immediately following a meal.   pravastatin  (PRAVACHOL ) 20 MG tablet Take 1 tablet (20 mg total) by mouth daily.   spironolactone  (ALDACTONE ) 25 MG tablet Take 1 tablet (25 mg total) by mouth daily.   traMADol  (ULTRAM ) 50 MG tablet Take 1 tablet (50 mg total) by mouth at bedtime.   valsartan  (DIOVAN ) 160 MG tablet Take 1 tablet (160 mg total) by mouth 2 (two) times daily.   XARELTO  20 MG TABS tablet TAKE 1 TABLET(20 MG) BY MOUTH DAILY WITH SUPPER   "

## 2024-07-19 ENCOUNTER — Ambulatory Visit: Attending: Cardiology | Admitting: Cardiology

## 2024-07-19 ENCOUNTER — Encounter: Payer: Self-pay | Admitting: Cardiology

## 2024-07-19 VITALS — BP 126/62 | HR 65 | Ht 72.0 in | Wt 197.2 lb

## 2024-07-19 DIAGNOSIS — D682 Hereditary deficiency of other clotting factors: Secondary | ICD-10-CM

## 2024-07-19 DIAGNOSIS — I4510 Unspecified right bundle-branch block: Secondary | ICD-10-CM

## 2024-07-19 DIAGNOSIS — Z7901 Long term (current) use of anticoagulants: Secondary | ICD-10-CM | POA: Diagnosis not present

## 2024-07-19 DIAGNOSIS — Z79899 Other long term (current) drug therapy: Secondary | ICD-10-CM

## 2024-07-19 DIAGNOSIS — I10 Essential (primary) hypertension: Secondary | ICD-10-CM

## 2024-07-19 DIAGNOSIS — I493 Ventricular premature depolarization: Secondary | ICD-10-CM

## 2024-07-19 NOTE — Patient Instructions (Signed)
 Medication Instructions:  Your physician recommends that you continue on your current medications as directed. Please refer to the Current Medication list given to you today.  *If you need a refill on your cardiac medications before your next appointment, please call your pharmacy*  Lab Work: NONE If you have labs (blood work) drawn today and your tests are completely normal, you will receive your results only by: MyChart Message (if you have MyChart) OR A paper copy in the mail If you have any lab test that is abnormal or we need to change your treatment, we will call you to review the results.  Testing/Procedures: NONE  Follow-Up: At [facility], you and your health needs are our priority.  As part of our continuing mission to provide you with exceptional heart care, our providers are all part of one team.  This team includes your primary Cardiologist (physician) and Advanced Practice Providers or APPs (Physician Assistants and Nurse Practitioners) who all work together to provide you with the care you need, when you need it.  Your next appointment:   6 month(s)  Provider:   [name], MD

## 2024-07-26 ENCOUNTER — Encounter: Payer: Self-pay | Admitting: Orthopedic Surgery

## 2024-07-26 ENCOUNTER — Other Ambulatory Visit: Payer: Self-pay

## 2024-07-26 ENCOUNTER — Ambulatory Visit: Admitting: Orthopedic Surgery

## 2024-07-26 DIAGNOSIS — M6701 Short Achilles tendon (acquired), right ankle: Secondary | ICD-10-CM

## 2024-07-26 DIAGNOSIS — M79671 Pain in right foot: Secondary | ICD-10-CM

## 2024-07-26 DIAGNOSIS — M2021 Hallux rigidus, right foot: Secondary | ICD-10-CM

## 2024-07-26 NOTE — Progress Notes (Signed)
 "  Office Visit Note   Patient: Patrick Sosa           Date of Birth: 10-08-1945           MRN: [id number] Visit Date: 07/26/2024              Requested by: Harden Jerona GAILS, MD [address] [place],  [place] [address] PCP: Merna Huxley, NP  Chief Complaint  Patient presents with   Right Foot - Pain   Left Foot - Pain      HPI: Discussed the use of AI scribe software for clinical note transcription with the patient, who gave verbal consent to proceed.  History of Present Illness [name] is a 79 year old male with bilateral Achilles tendon contracture, bilateral hallux rigidus, and chronic venous insufficiency who presents for evaluation of chronic bilateral foot pain, worse on the left.  He experiences chronic bilateral foot pain, with the left foot being most symptomatic. The pain is described as aching and throbbing, primarily localized to the bunion region of the left foot. Symptoms are most severe at night, interfering with sleep and requiring him to sleep in a recliner. Pain is exacerbated by activity, such as yard work, and prolonged car travel, necessitating shoe removal for relief.  He notes visible superficial blood vessels that appear to rupture and become more prominent during activity. He also describes neuropathic symptoms in both feet, especially at night, which he attributes to neuropathy of unclear etiology.  He has trialed multiple therapies including hard orthotic supports, gabapentin , and tramadol , but continues to experience persistent pain. He has been followed by Dr. Magdalen since age [age] for these foot issues.  Radiographs of the right foot show arthritic changes of the first metatarsophalangeal joint and elongated second, third, and fourth metatarsals. Imaging of the left foot shows elongated second, third, and fourth metatarsals and mild bunion deformities bilaterally.     Assessment & Plan: Visit Diagnoses:  1. Bilateral foot pain   2. Achilles tendon  contracture, bilateral   3. Hallux rigidus, bilateral     Plan: Assessment and Plan Assessment & Plan Bilateral hallux rigidus Chronic bilateral hallux rigidus with significant stiffness and limited motion of the first metatarsophalangeal joints. Symptoms are most pronounced on the left. Radiographs show arthritic changes and elongated second, third, and fourth metatarsals. Symptoms persist despite prior treatments. - Recommended stiff sneakers to reduce forefoot and hallux pressure. - Advised use of a flat carbon fiber insole (approximately 1 mm thick) under the shoe insert to offload the hallux and distribute forefoot pressure. - Provided instructions on purchasing carbon fiber insoles (e.g., [employer]). - Advised follow-up as needed if symptoms persist or worsen.  Bilateral Achilles tendon contracture Significant bilateral Achilles tendon contracture with dorsiflexion limited to 20 degrees short of neutral, increasing forefoot pressure and pain during ambulation and prolonged activity. - Demonstrated and instructed on Achilles tendon stretching exercises to be performed three times daily for approximately one minute per session, with sneakers on and holding support for safety.  Chronic venous insufficiency with bilateral varicose veins Chronic venous insufficiency with bilateral varicose veins and pitting edema, contributing to discomfort and visible vascular changes, especially with activity. - Recommended knee-high compression socks to minimize symptoms of varicose veins and venous insufficiency. - Provided information on where to purchase compression socks (e.g., local medical supply store).      Follow-Up Instructions: Return if symptoms worsen or fail to improve.   Ortho Exam  Patient is alert,  oriented, no adenopathy, well-dressed, normal affect, normal respiratory effort. Physical Exam CARDIOVASCULAR: Palpable dorsal pedal pulse bilaterally. EXTREMITIES: Varicose veins with  pitting edema and venous insufficiency bilaterally. MUSCULOSKELETAL: Dorsiflexion 20 degrees short of neutral with significant Achilles contracture bilaterally. Hallux rigidus with dorsiflexion 10 degrees and plantarflexion 10 degrees bilaterally.      Imaging: XR Foot 2 Views Left Result Date: 07/26/2024 Radiographs of the left foot shows mild arthritic changes of the great toe MTP joint with mild bunion deformity along 2nd, 3rd and 4th metatarsal  XR Foot 2 Views Right Result Date: 07/26/2024 Radiographs of the right foot shows arthritic changes of the great toe MTP joint with mild bunion deformity and a long 2nd, 3rd and 4th metatarsal  No images are attached to the encounter.  Labs: Lab Results  Component Value Date   HGBA1C 5.2 09/17/2022   HGBA1C 5.2 09/07/2021   ESRSEDRATE 1 06/14/2011   ESRSEDRATE 2 05/24/2008   LABURIC 6.7 09/13/2023     Lab Results  Component Value Date   ALBUMIN 4.6 10/13/2023   ALBUMIN 4.5 04/01/2020   ALBUMIN 4.1 08/30/2019    Lab Results  Component Value Date   MG 2.1 03/30/2022   No results found for: VD25OH  No results found for: PREALBUMIN    Latest Ref Rng & Units 10/13/2023   10:21 AM 03/04/2023    1:10 PM 09/17/2022    8:21 AM  CBC EXTENDED  WBC 4.0 - 10.5 K/uL 7.1  8.1  6.3   RBC 4.22 - 5.81 Mil/uL 4.41  4.51  4.38   Hemoglobin 13.0 - 17.0 g/dL 84.3  83.8  84.1   HCT 39.0 - 52.0 % 45.3  46.0  44.1   Platelets 150.0 - 400.0 K/uL 172.0  184  191      There is no height or weight on file to calculate BMI.  Orders:  Orders Placed This Encounter  Procedures   XR Foot 2 Views Right   XR Foot 2 Views Left   No orders of the defined types were placed in this encounter.    Procedures: No procedures performed  Clinical Data: No additional findings.  ROS:  All other systems negative, except as noted in the HPI. Review of Systems  Objective: Vital Signs: There were no vitals taken for this visit.  Specialty  Comments:  No specialty comments available.  PMFS History: Patient Active Problem List   Diagnosis Date Noted   Irregular heart rhythm 09/19/2023   PVC's (premature ventricular contractions); Hx of bigeminy 03/30/2022   Stage 3a chronic kidney disease (CKD) (HCC) 03/30/2022   RBBB 03/30/2022   Hx of adenomatous colonic polyps 08/20/2018   Polycythemia    DVT (deep venous thrombosis) (HCC)    Hypercholesterolemia    ONYCHOMYCOSIS 06/03/2010   Factor V deficiency (HCC); Hx of DVT (on long term anticoagulation) 08/20/2009   Essential hypertension 08/23/2007   Past Medical History:  Diagnosis Date   Chronic anticoagulation    Diverticulosis    DVT (deep venous thrombosis) (HCC)    Factor V deficiency (HCC)    Hx of adenomatous colonic polyps 08/20/2018   Hypercholesterolemia    Hypertension    Kidney disease    Stage III   Kidney stones    Pneumonia    Polycythemia    Prostate cancer (HCC)     Family History  Problem Relation Age of Onset   Alzheimer's disease Mother    Arthritis Mother    Pancreatic cancer Father  Hypertension Father    Heart attack Father    Hypertension Sister    Breast cancer Sister    Arthritis Sister    Rheum arthritis Daughter    Colon cancer Neg Hx    Stomach cancer Neg Hx    Rectal cancer Neg Hx    Liver cancer Neg Hx    Esophageal cancer Neg Hx     Past Surgical History:  Procedure Laterality Date   CATARACT EXTRACTION, BILATERAL Bilateral 2018   COLONOSCOPY     INGUINAL HERNIA REPAIR Left 03/11/2023   Procedure: LAPAROSCOPIC POSSIBLE OPEN LEFT INGUINAL HERNIA REPAIR WITH MESH;  Surgeon: Stechschulte, Deward PARAS, MD;  Location: [facility];  Service: General;  Laterality: Left;   ROBOT ASSISTED LAPAROSCOPIC RADICAL PROSTATECTOMY  10/06/2021   TONSILLECTOMY     Social History   Occupational History   Occupation: retired  Tobacco Use   Smoking status: Former    Current packs/day: 0.00    Types: Cigarettes    Quit date: 10/11/1981     Years since quitting: 42.8   Smokeless tobacco: Never  Vaping Use   Vaping status: Never Used  Substance and Sexual Activity   Alcohol use: No    Alcohol/week: 0.0 standard drinks of alcohol    Comment: rarely   Drug use: No   Sexual activity: Yes         "

## 2024-08-15 ENCOUNTER — Ambulatory Visit: Admitting: Pharmacist

## 2024-10-16 ENCOUNTER — Encounter: Admitting: Adult Health
